# Patient Record
Sex: Female | Born: 1935 | Race: White | Hispanic: No | Smoking: Current every day smoker
Health system: Southern US, Community
[De-identification: ages and names within clinical notes are randomized; demographics above are authoritative.]

## PROBLEM LIST (undated history)

## (undated) DIAGNOSIS — C801 Malignant (primary) neoplasm, unspecified: Secondary | ICD-10-CM

## (undated) DIAGNOSIS — F32A Depression, unspecified: Secondary | ICD-10-CM

## (undated) DIAGNOSIS — E0590 Thyrotoxicosis, unspecified without thyrotoxic crisis or storm: Secondary | ICD-10-CM

## (undated) DIAGNOSIS — M797 Fibromyalgia: Secondary | ICD-10-CM

## (undated) DIAGNOSIS — T7840XA Allergy, unspecified, initial encounter: Secondary | ICD-10-CM

## (undated) DIAGNOSIS — B029 Zoster without complications: Secondary | ICD-10-CM

## (undated) DIAGNOSIS — K219 Gastro-esophageal reflux disease without esophagitis: Secondary | ICD-10-CM

## (undated) DIAGNOSIS — K559 Vascular disorder of intestine, unspecified: Secondary | ICD-10-CM

## (undated) DIAGNOSIS — I739 Peripheral vascular disease, unspecified: Secondary | ICD-10-CM

## (undated) DIAGNOSIS — F329 Major depressive disorder, single episode, unspecified: Secondary | ICD-10-CM

## (undated) HISTORY — DX: Major depressive disorder, single episode, unspecified: F32.9

## (undated) HISTORY — DX: Allergy, unspecified, initial encounter: T78.40XA

## (undated) HISTORY — DX: Depression, unspecified: F32.A

## (undated) HISTORY — DX: Malignant (primary) neoplasm, unspecified: C80.1

## (undated) HISTORY — DX: Thyrotoxicosis, unspecified without thyrotoxic crisis or storm: E05.90

## (undated) HISTORY — PX: TONSILLECTOMY: SUR1361

## (undated) HISTORY — DX: Vascular disorder of intestine, unspecified: K55.9

## (undated) HISTORY — DX: Fibromyalgia: M79.7

## (undated) HISTORY — DX: Peripheral vascular disease, unspecified: I73.9

## (undated) HISTORY — PX: CERVICAL FUSION: SHX112

## (undated) HISTORY — DX: Gastro-esophageal reflux disease without esophagitis: K21.9

## (undated) HISTORY — DX: Zoster without complications: B02.9

---
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
## 1967-10-10 HISTORY — PX: HEMORRHOID SURGERY: SHX153

## 1973-10-09 HISTORY — PX: ABDOMINAL HYSTERECTOMY: SHX81

## 1977-10-09 HISTORY — PX: ORIF ANKLE FRACTURE: SUR919

## 1988-10-09 HISTORY — PX: CARPAL TUNNEL RELEASE: SHX101

## 2007-05-13 DIAGNOSIS — B029 Zoster without complications: Secondary | ICD-10-CM | POA: Insufficient documentation

## 2007-05-24 DIAGNOSIS — H409 Unspecified glaucoma: Secondary | ICD-10-CM | POA: Insufficient documentation

## 2007-05-24 DIAGNOSIS — L719 Rosacea, unspecified: Secondary | ICD-10-CM | POA: Insufficient documentation

## 2007-05-24 DIAGNOSIS — C4490 Unspecified malignant neoplasm of skin, unspecified: Secondary | ICD-10-CM | POA: Insufficient documentation

## 2007-10-10 HISTORY — PX: CATARACT EXTRACTION: SUR2

## 2007-11-04 DIAGNOSIS — F341 Dysthymic disorder: Secondary | ICD-10-CM | POA: Insufficient documentation

## 2007-12-03 DIAGNOSIS — F411 Generalized anxiety disorder: Secondary | ICD-10-CM | POA: Insufficient documentation

## 2008-10-09 DIAGNOSIS — E0590 Thyrotoxicosis, unspecified without thyrotoxic crisis or storm: Secondary | ICD-10-CM

## 2008-10-09 HISTORY — DX: Thyrotoxicosis, unspecified without thyrotoxic crisis or storm: E05.90

## 2009-07-14 DIAGNOSIS — K279 Peptic ulcer, site unspecified, unspecified as acute or chronic, without hemorrhage or perforation: Secondary | ICD-10-CM | POA: Insufficient documentation

## 2010-10-25 DIAGNOSIS — M5412 Radiculopathy, cervical region: Secondary | ICD-10-CM | POA: Insufficient documentation

## 2010-10-25 DIAGNOSIS — IMO0001 Reserved for inherently not codable concepts without codable children: Secondary | ICD-10-CM | POA: Insufficient documentation

## 2011-03-03 ENCOUNTER — Encounter: Payer: Self-pay | Admitting: Family Medicine

## 2011-03-03 ENCOUNTER — Ambulatory Visit (INDEPENDENT_AMBULATORY_CARE_PROVIDER_SITE_OTHER): Payer: Medicare HMO | Admitting: Family Medicine

## 2011-03-03 VITALS — BP 127/80 | HR 109 | Temp 98.4°F | Ht 62.0 in | Wt 126.0 lb

## 2011-03-03 DIAGNOSIS — E0590 Thyrotoxicosis, unspecified without thyrotoxic crisis or storm: Secondary | ICD-10-CM

## 2011-03-03 DIAGNOSIS — M5030 Other cervical disc degeneration, unspecified cervical region: Secondary | ICD-10-CM

## 2011-03-03 DIAGNOSIS — IMO0001 Reserved for inherently not codable concepts without codable children: Secondary | ICD-10-CM

## 2011-03-03 DIAGNOSIS — M797 Fibromyalgia: Secondary | ICD-10-CM

## 2011-03-03 DIAGNOSIS — F329 Major depressive disorder, single episode, unspecified: Secondary | ICD-10-CM

## 2011-03-03 DIAGNOSIS — K219 Gastro-esophageal reflux disease without esophagitis: Secondary | ICD-10-CM

## 2011-03-03 DIAGNOSIS — F3289 Other specified depressive episodes: Secondary | ICD-10-CM

## 2011-03-03 DIAGNOSIS — F319 Bipolar disorder, unspecified: Secondary | ICD-10-CM | POA: Insufficient documentation

## 2011-03-03 MED ORDER — HYDROCODONE-ACETAMINOPHEN 10-325 MG PO TABS: 1.0000 | ORAL_TABLET | Freq: Four times a day (QID) | ORAL | Status: DC | PRN

## 2011-03-03 MED ORDER — ALPRAZOLAM 0.5 MG PO TABS: 0.5000 mg | ORAL_TABLET | Freq: Four times a day (QID) | ORAL | Status: DC | PRN

## 2011-03-03 MED ORDER — SERTRALINE HCL 25 MG PO TABS: ORAL_TABLET | ORAL | Status: DC

## 2011-03-03 NOTE — Patient Instructions (Signed)
Stay on current meds.  I would strongly encourage you to start Sertraline 25 mg once daily for your mood.  After the first wk, you can increase this to 2 tabs once daily. Call me if any problems.  Return for f/u mood and fasting labs in 6 wks.
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
## 2011-03-27 ENCOUNTER — Encounter: Payer: Self-pay | Admitting: Family Medicine

## 2011-03-27 DIAGNOSIS — E039 Hypothyroidism, unspecified: Secondary | ICD-10-CM

## 2011-03-27 DIAGNOSIS — E0590 Thyrotoxicosis, unspecified without thyrotoxic crisis or storm: Secondary | ICD-10-CM | POA: Insufficient documentation

## 2011-03-27 DIAGNOSIS — M5030 Other cervical disc degeneration, unspecified cervical region: Secondary | ICD-10-CM

## 2011-04-14 ENCOUNTER — Ambulatory Visit (INDEPENDENT_AMBULATORY_CARE_PROVIDER_SITE_OTHER): Payer: Medicare HMO | Admitting: Family Medicine

## 2011-04-14 ENCOUNTER — Encounter: Payer: Self-pay | Admitting: Family Medicine

## 2011-04-14 DIAGNOSIS — Z13220 Encounter for screening for lipoid disorders: Secondary | ICD-10-CM

## 2011-04-14 DIAGNOSIS — Z130 Encounter for screening for diseases of the blood and blood-forming organs and certain disorders involving the immune mechanism: Secondary | ICD-10-CM

## 2011-04-14 DIAGNOSIS — E039 Hypothyroidism, unspecified: Secondary | ICD-10-CM

## 2011-04-14 DIAGNOSIS — F319 Bipolar disorder, unspecified: Secondary | ICD-10-CM

## 2011-04-14 DIAGNOSIS — R5383 Other fatigue: Secondary | ICD-10-CM

## 2011-04-14 DIAGNOSIS — M5030 Other cervical disc degeneration, unspecified cervical region: Secondary | ICD-10-CM

## 2011-04-14 DIAGNOSIS — Z1231 Encounter for screening mammogram for malignant neoplasm of breast: Secondary | ICD-10-CM

## 2011-04-14 LAB — LIPID PANEL
Cholesterol: 309 mg/dL — ABNORMAL HIGH (ref 0–200)
HDL: 48 mg/dL (ref >39)
LDL Cholesterol: 215 mg/dL — ABNORMAL HIGH (ref 0–99)
Total CHOL/HDL Ratio: 6.4 Ratio
Triglycerides: 230 mg/dL — ABNORMAL HIGH (ref <150)
VLDL: 46 mg/dL — ABNORMAL HIGH (ref 0–40)

## 2011-04-14 LAB — CBC WITH DIFFERENTIAL/PLATELET
Basophils Absolute: 0 10*3/uL (ref 0.0–0.1)
Basophils Relative: 1 % (ref 0–1)
Eosinophils Absolute: 0.1 10*3/uL (ref 0.0–0.7)
Eosinophils Relative: 2 % (ref 0–5)
HCT: 45.2 % (ref 36.0–46.0)
Hemoglobin: 15.1 g/dL — ABNORMAL HIGH (ref 12.0–15.0)
Lymphocytes Relative: 32 % (ref 12–46)
Lymphs Abs: 2.1 10*3/uL (ref 0.7–4.0)
MCH: 30.8 pg (ref 26.0–34.0)
MCHC: 33.4 g/dL (ref 30.0–36.0)
MCV: 92.2 fL (ref 78.0–100.0)
Monocytes Absolute: 0.5 10*3/uL (ref 0.1–1.0)
Monocytes Relative: 8 % (ref 3–12)
Neutro Abs: 3.8 10*3/uL (ref 1.7–7.7)
Neutrophils Relative %: 58 % (ref 43–77)
Platelets: 220 10*3/uL (ref 150–400)
RBC: 4.9 MIL/uL (ref 3.87–5.11)
RDW: 14.1 % (ref 11.5–15.5)
WBC: 6.5 10*3/uL (ref 4.0–10.5)

## 2011-04-14 MED ORDER — HYDROCODONE-ACETAMINOPHEN 10-325 MG PO TABS: 1.0000 | ORAL_TABLET | Freq: Four times a day (QID) | ORAL | Status: DC | PRN

## 2011-04-14 NOTE — Patient Instructions (Signed)
Labs downstairs today. Will call you w/ results on Monday.  Vicodin RFd.  F/u with ortho.  Call if any worsening in mood, needing referral to psych,etc.  Call [phone] and ask for [facility] to schedule your mammogram.

## 2011-04-14 NOTE — Assessment & Plan Note (Signed)
I did RF her vicodin today.  She has a 3 wk f/u with Dr Carole Binning at [facility].
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
## 2011-04-14 NOTE — Assessment & Plan Note (Addendum)
PHQ 9 score is 8.  Off Zoloft due to SEs and has failed multiple other SNRIs and SSRIs.  She is not overusing her Xanax and is seeing a Veterinary surgeon.  She declined a psychiatry referral and will call if any worsening mood.  I cannot make her see psych since she is not a direct threat to herself.

## 2011-04-14 NOTE — Assessment & Plan Note (Signed)
Sees endocrinology for her thyroid.

## 2011-04-15 LAB — COMPLETE METABOLIC PANEL WITH GFR
ALT: 12 U/L (ref 0–35)
AST: 18 U/L (ref 0–37)
Albumin: 4.7 g/dL (ref 3.5–5.2)
Alkaline Phosphatase: 85 U/L (ref 39–117)
BUN: 11 mg/dL (ref 6–23)
CO2: 25 mEq/L (ref 19–32)
Calcium: 9.6 mg/dL (ref 8.4–10.5)
Chloride: 105 mEq/L (ref 96–112)
Creat: 0.83 mg/dL (ref 0.50–1.10)
GFR, Est African American: >60 mL/min (ref >60)
GFR, Est Non African American: >60 mL/min (ref >60)
Glucose, Bld: 97 mg/dL (ref 70–99)
Potassium: 4.6 mEq/L (ref 3.5–5.3)
Sodium: 138 mEq/L (ref 135–145)
Total Bilirubin: 0.7 mg/dL (ref 0.3–1.2)
Total Protein: 7.4 g/dL (ref 6.0–8.3)

## 2011-04-16 ENCOUNTER — Telehealth: Payer: Self-pay | Admitting: Family Medicine

## 2011-04-16 DIAGNOSIS — E7800 Pure hypercholesterolemia, unspecified: Secondary | ICD-10-CM | POA: Insufficient documentation

## 2011-04-16 NOTE — Telephone Encounter (Signed)
Pls let pt know that her blood counts, fasting sugar, liver and kidney function look great.  Her cholesterol is VERY high.  Has she ever been on RX meds for this?  I suggest treating her to reduce her risk for heart attack and stroke.

## 2011-04-17 MED ORDER — ROSUVASTATIN CALCIUM 20 MG PO TABS: 10.0000 mg | ORAL_TABLET | Freq: Every day | ORAL | Status: DC

## 2011-04-17 NOTE — Telephone Encounter (Signed)
LMOM informing Pt of the above 

## 2011-04-17 NOTE — Telephone Encounter (Signed)
Pt aware of the above. Pt agrees to medication. Please send to pharm

## 2011-04-17 NOTE — Telephone Encounter (Signed)
I will start her on samples of Crestor, 1/2 of a 20 mg tab at bedtime everyday. Repeat labs in 2 mos.  Call if any problems.

## 2011-04-18 ENCOUNTER — Telehealth: Payer: Self-pay | Admitting: Family Medicine

## 2011-04-18 NOTE — Telephone Encounter (Signed)
Returning call from yesterday.  Please call the patient at [phone]. Plan:  Routed to Payton Spark,  CMA since this is a [facility] patient. Jarvis Newcomer, LPN [facility]

## 2011-04-18 NOTE — Telephone Encounter (Signed)
Pt aware of the above  

## 2011-04-25 ENCOUNTER — Ambulatory Visit
Admission: RE | Admit: 2011-04-25 | Discharge: 2011-04-25 | Disposition: A | Discharge status: Home / Self Care | Payer: Medicare HMO | Source: Ambulatory Visit | Attending: Family Medicine | Admitting: Family Medicine

## 2011-04-25 DIAGNOSIS — Z1231 Encounter for screening mammogram for malignant neoplasm of breast: Secondary | ICD-10-CM

## 2011-04-26 ENCOUNTER — Other Ambulatory Visit: Payer: Self-pay | Admitting: Family Medicine

## 2011-04-26 NOTE — Telephone Encounter (Signed)
Request was sent for hydrocodone apap.  Script was sent on 04-14-11 for #90/0 refills,  Denied the script. Jarvis Newcomer, LPN [facility]

## 2011-05-12 ENCOUNTER — Other Ambulatory Visit: Payer: Self-pay | Admitting: *Deleted

## 2011-05-12 ENCOUNTER — Telehealth: Payer: Self-pay | Admitting: Family Medicine

## 2011-05-12 MED ORDER — ALPRAZOLAM 0.5 MG PO TABS: 0.5000 mg | ORAL_TABLET | Freq: Four times a day (QID) | ORAL | Status: DC | PRN

## 2011-05-12 NOTE — Telephone Encounter (Signed)
This has been done.

## 2011-05-12 NOTE — Telephone Encounter (Signed)
Patient called states her pharmacy said they have not received prescription refill request for zanex and she needs her medicine refill today before the weekend. Pt request a call back [phone]

## 2011-05-26 LAB — VITAMIN D 25 HYDROXY (VIT D DEFICIENCY, FRACTURES)
Calcium: 9.9 mg/dL
Chloride, Serum: 104
Vit D, 25-Hydroxy: 34.9

## 2011-05-26 LAB — CBC AND DIFFERENTIAL
HCT: 46 % (ref 36–46)
Hemoglobin: 15.4 g/dL (ref 12.0–16.0)

## 2011-05-26 LAB — HEPATIC FUNCTION PANEL
ALT: 11 U/L (ref 7–35)
AST: 16 U/L (ref 13–35)

## 2011-05-26 LAB — BASIC METABOLIC PANEL
BUN: 8 mg/dL (ref 4–21)
Creatinine: 0.8 mg/dL (ref 0.5–1.1)
Glucose: 121 mg/dL
Potassium: 4.2 mmol/L (ref 3.4–5.3)

## 2011-06-16 ENCOUNTER — Encounter: Payer: Self-pay | Admitting: Family Medicine

## 2011-06-16 ENCOUNTER — Ambulatory Visit (INDEPENDENT_AMBULATORY_CARE_PROVIDER_SITE_OTHER): Payer: Medicare HMO | Admitting: Family Medicine

## 2011-06-16 DIAGNOSIS — I10 Essential (primary) hypertension: Secondary | ICD-10-CM

## 2011-06-16 DIAGNOSIS — K219 Gastro-esophageal reflux disease without esophagitis: Secondary | ICD-10-CM | POA: Insufficient documentation

## 2011-06-16 DIAGNOSIS — E7800 Pure hypercholesterolemia, unspecified: Secondary | ICD-10-CM

## 2011-06-16 DIAGNOSIS — E785 Hyperlipidemia, unspecified: Secondary | ICD-10-CM

## 2011-06-16 MED ORDER — NITROFURANTOIN MONOHYD MACRO 100 MG PO CAPS: 100.0000 mg | ORAL_CAPSULE | ORAL | Status: DC | PRN

## 2011-06-16 NOTE — Progress Notes (Signed)
  Subjective:    Patient ID: Heather Perkins, female    DOB: 03/05/1934, 77 y.o.   MRN: [id number]  HPI Gets nauseated in the AM. She said that the over-the-counter Zegerid that she uses does seem to help. She also has used something that is an audible supplement that she says also works very well. She says she just recently ran out of it but plans to restart it.  Here to f/u  lipids. She was restarted on Crestor and is here to recheck her levels. She said she did well in the past but so far has not had any problems or side effects or myalgias. She says she is compliant with her medications. She denies any chest pain or dizziness.   Review of Systems     Objective:   Physical Exam  Constitutional: She is oriented to person, place, and time. She appears well-developed and well-nourished.  HENT:  Head: Normocephalic and atraumatic.  Neck: Neck supple.  Cardiovascular: Normal rate, regular rhythm and normal heart sounds.        No carotid bruits.   Pulmonary/Chest: Effort normal and breath sounds normal.  Neurological: She is alert and oriented to person, place, and time.  Skin: Skin is warm and dry.  Psychiatric: She has a normal mood and affect. Her behavior is normal.          Assessment & Plan:  Blood pressure looks well controlled today.  GERD-if the herbal supplements and to help him pursue she can certainly restart this if not The over-the-counter Prilosec or Prevacid or Zegerid products is fine. I did instruct her to reduce her caffeine intake as well.

## 2011-06-16 NOTE — Assessment & Plan Note (Signed)
She is to recheck her lipids today on the Crestor. I did give her some more samples as it is an expensive medication for her.

## 2011-06-17 LAB — COMPLETE METABOLIC PANEL WITH GFR
ALT: 10 U/L (ref 0–35)
AST: 19 U/L (ref 0–37)
Albumin: 4.6 g/dL (ref 3.5–5.2)
Alkaline Phosphatase: 74 U/L (ref 39–117)
BUN: 10 mg/dL (ref 6–23)
CO2: 24 mEq/L (ref 19–32)
Calcium: 9.8 mg/dL (ref 8.4–10.5)
Chloride: 105 mEq/L (ref 96–112)
Creat: 0.77 mg/dL (ref 0.50–1.10)
GFR, Est African American: >60 mL/min (ref >60)
GFR, Est Non African American: >60 mL/min (ref >60)
Glucose, Bld: 95 mg/dL (ref 70–99)
Potassium: 4.5 mEq/L (ref 3.5–5.3)
Sodium: 139 mEq/L (ref 135–145)
Total Bilirubin: 0.7 mg/dL (ref 0.3–1.2)
Total Protein: 7.4 g/dL (ref 6.0–8.3)

## 2011-06-17 LAB — LIPID PANEL
Cholesterol: 167 mg/dL (ref 0–200)
HDL: 52 mg/dL (ref >39)
LDL Cholesterol: 84 mg/dL (ref 0–99)
Total CHOL/HDL Ratio: 3.2 Ratio
Triglycerides: 154 mg/dL — ABNORMAL HIGH (ref <150)
VLDL: 31 mg/dL (ref 0–40)

## 2011-06-19 ENCOUNTER — Telehealth: Payer: Self-pay | Admitting: Family Medicine

## 2011-06-19 NOTE — Telephone Encounter (Signed)
Pt calling for lab results.   Plan:  Pt informed of her lab results. Jarvis Newcomer, LPN [facility]

## 2011-06-19 NOTE — Telephone Encounter (Signed)
Call patient: Complete metabolic panel is normal. Cholesterol was normal except for triglycerides were just borderline elevated. Just make sure getting regular exercise and eating healthy lower fat diet. You could also consider taking a fish oil tab once a day to help improve triglyceride . Recheck in one year.

## 2011-06-19 NOTE — Telephone Encounter (Signed)
LMOM informing Pt  

## 2011-06-20 ENCOUNTER — Other Ambulatory Visit: Payer: Self-pay | Admitting: *Deleted

## 2011-06-20 MED ORDER — HYDROCODONE-ACETAMINOPHEN 10-325 MG PO TABS: 1.0000 | ORAL_TABLET | Freq: Four times a day (QID) | ORAL | Status: DC | PRN

## 2011-06-20 MED ORDER — ALPRAZOLAM 0.5 MG PO TABS: 0.5000 mg | ORAL_TABLET | Freq: Four times a day (QID) | ORAL | Status: DC | PRN

## 2011-06-23 ENCOUNTER — Telehealth: Payer: Self-pay | Admitting: Family Medicine

## 2011-06-23 NOTE — Telephone Encounter (Signed)
Pt called and left message for the triage nurse stating the pharm does not have her refill for norco 10-325 mg.   Plan:  Reviewed the pt chart and the norco 10/325 mg was sent and xanex 0.5 was also sent on 06-20-2011.  Gave verbal order for both meds to the pharmacist. Jarvis Newcomer, LPN [facility]'

## 2011-07-06 ENCOUNTER — Ambulatory Visit (INDEPENDENT_AMBULATORY_CARE_PROVIDER_SITE_OTHER): Payer: Medicare HMO | Admitting: Family Medicine

## 2011-07-06 ENCOUNTER — Encounter: Payer: Self-pay | Admitting: Family Medicine

## 2011-07-06 VITALS — BP 107/63 | HR 87 | Temp 98.1°F | Wt 126.0 lb

## 2011-07-06 DIAGNOSIS — J329 Chronic sinusitis, unspecified: Secondary | ICD-10-CM

## 2011-07-06 DIAGNOSIS — M549 Dorsalgia, unspecified: Secondary | ICD-10-CM

## 2011-07-06 DIAGNOSIS — N390 Urinary tract infection, site not specified: Secondary | ICD-10-CM

## 2011-07-06 DIAGNOSIS — R300 Dysuria: Secondary | ICD-10-CM

## 2011-07-06 LAB — POCT URINALYSIS DIPSTICK
Bilirubin, UA: NEGATIVE
Blood, UA: NEGATIVE
Glucose, UA: NEGATIVE
Ketones, UA: NEGATIVE
Leukocytes, UA: NEGATIVE
Nitrite, UA: NEGATIVE
Protein, UA: NEGATIVE
Spec Grav, UA: <=1.005
Urobilinogen, UA: 0.2
pH, UA: 6.5

## 2011-07-06 NOTE — Progress Notes (Signed)
  Subjective:    Patient ID: Heather Perkins, female    DOB: 1934-07-02, 77 y.o.   MRN: [id number]  HPI Bladder pain for 5 days. Then started urinating every 30 min and then got irritated externally. Took AZO for a couple of days.  Then started getting left low back pain that started throbbing about 3 days ago. Increased her fluids and started drinking cranberry juice. Feels week.  Has been getting lightheaded and nauseated.  No fever. Has been having HA as well.  Tooks phenergan when got home.  Some mild naasl congestion and cough for about a week.  Hx of back problems but says this feels different.    Review of Systems     Objective:   Physical Exam  Constitutional: She is oriented to person, place, and time. She appears well-developed and well-nourished.  HENT:  Head: Normocephalic and atraumatic.  Right Ear: External ear normal.  Left Ear: External ear normal.  Nose: Nose normal.  Mouth/Throat: Oropharynx is clear and moist.       TMs and canals are clear.   Eyes: Conjunctivae and EOM are normal. Pupils are equal, round, and reactive to light.  Neck: Neck supple. No thyromegaly present.  Cardiovascular: Normal rate, regular rhythm and normal heart sounds.   Pulmonary/Chest: Effort normal and breath sounds normal. She has no wheezes.  Abdominal: Soft. Bowel sounds are normal. She exhibits no distension and no mass. There is no tenderness. There is no rebound and no guarding.  Lymphadenopathy:    She has no cervical adenopathy.  Neurological: She is alert and oriented to person, place, and time.  Skin: Skin is warm and dry.  Psychiatric: She has a normal mood and affect. Her behavior is normal.  Pain in the left but had CVA tenderness on the right.         Assessment & Plan:  Dysuria - Dong well.  I will send urine culture and will call her with the results on Monday.  No fever which is reassuring thought she has been nauseated. She already has a rx for nitrofurantoin at home so will  start the medication and cll her with the culture results. Bck pain may be related to the UTI but may also be from her back.   Early sinusitis - Call is sxs persist into [place] or worsen.  She is still smoking but trying to work on cutting back.

## 2011-07-06 NOTE — Patient Instructions (Signed)
Start your nitrofurantoin. Let me know if you are better on Monday with your sinuses and your bladder.

## 2011-07-09 LAB — URINE CULTURE
Colony Count: NO GROWTH
Organism ID, Bacteria: NO GROWTH

## 2011-07-18 ENCOUNTER — Telehealth: Payer: Self-pay | Admitting: Family Medicine

## 2011-07-18 DIAGNOSIS — M791 Myalgia, unspecified site: Secondary | ICD-10-CM

## 2011-07-18 NOTE — Telephone Encounter (Addendum)
Try taking every other day and see if feels better. Also need to go to lab to check a CK level and make sure her muscle enzymes are nl.

## 2011-07-18 NOTE — Telephone Encounter (Signed)
Pt called and left mess saying she is having adverse reaction to crestor.  C/O of worsening headaches, unable to sleep at night, GI issues(more flatus).  Pt requesting for further advice.  Trouble exercising due to fibromyalgia.  Please advise. Plan:  Routed to Dr. Marlyne Beards, LPN [facility]

## 2011-07-19 NOTE — Telephone Encounter (Signed)
Pt notified.  Had to [facility] with instructions. Jarvis Newcomer, LPN [facility]

## 2011-07-20 LAB — CK: Total CK: 55 U/L (ref 7–177)

## 2011-07-21 ENCOUNTER — Other Ambulatory Visit: Payer: Self-pay | Admitting: *Deleted

## 2011-07-21 ENCOUNTER — Other Ambulatory Visit: Payer: Self-pay | Admitting: Family Medicine

## 2011-07-21 MED ORDER — HYDROCODONE-ACETAMINOPHEN 10-325 MG PO TABS: 1.0000 | ORAL_TABLET | Freq: Four times a day (QID) | ORAL | Status: DC | PRN

## 2011-07-21 MED ORDER — ALPRAZOLAM 0.5 MG PO TABS: 0.5000 mg | ORAL_TABLET | Freq: Four times a day (QID) | ORAL | Status: DC | PRN

## 2011-07-21 NOTE — Telephone Encounter (Signed)
Pts daughter called for refill of pts medications:  Pain med and anxiety meds. Plan:  Reviewed chart and refills have already been taken care of.  Daughter informed. Jarvis Newcomer, LPN [facility]

## 2011-08-14 ENCOUNTER — Telehealth: Payer: Self-pay | Admitting: *Deleted

## 2011-08-14 NOTE — Telephone Encounter (Signed)
Pt is on cholesterol med livalo- does not fel right on this- having light headedness. Pt is going to stop this med- because she feels terrible. Would like to speak with you personally. Please call at your convience.

## 2011-08-14 NOTE — Telephone Encounter (Signed)
OK to stop med. Let me know once feeling better if she would like to try a different statin or not?

## 2011-08-15 NOTE — Telephone Encounter (Signed)
Pt notified of MD instructions

## 2011-08-21 ENCOUNTER — Other Ambulatory Visit: Payer: Self-pay | Admitting: *Deleted

## 2011-08-21 MED ORDER — HYDROCODONE-ACETAMINOPHEN 10-325 MG PO TABS: 1.0000 | ORAL_TABLET | Freq: Four times a day (QID) | ORAL | Status: DC | PRN

## 2011-08-23 ENCOUNTER — Ambulatory Visit (INDEPENDENT_AMBULATORY_CARE_PROVIDER_SITE_OTHER): Payer: Medicare HMO | Admitting: Family Medicine

## 2011-08-23 ENCOUNTER — Encounter: Payer: Self-pay | Admitting: Family Medicine

## 2011-08-23 DIAGNOSIS — M5030 Other cervical disc degeneration, unspecified cervical region: Secondary | ICD-10-CM

## 2011-08-23 DIAGNOSIS — E7800 Pure hypercholesterolemia, unspecified: Secondary | ICD-10-CM

## 2011-08-23 DIAGNOSIS — E039 Hypothyroidism, unspecified: Secondary | ICD-10-CM

## 2011-08-23 MED ORDER — NITROFURANTOIN MONOHYD MACRO 100 MG PO CAPS: 100.0000 mg | ORAL_CAPSULE | ORAL | Status: DC | PRN

## 2011-08-23 NOTE — Progress Notes (Signed)
  Subjective:    Patient ID: Heather Perkins, female    DOB: 10-07-1934, 77 y.o.   MRN: [id number]  HPI Felt horrible when took the livalo. Got constipated.  She says it really flared her fibromyalgia.  Says also got a sinus infection.  Still getting  Some tickling in her throat. Then stopped hte medication and started to feel better after about 4 days. Started taking honey.  + post nasal drip.  No fever.  She plans on taking cholesterol Glycerite which is a natural herb. She does not want to take any more statins. She says she is very active but does not necessarily have an exercise program.  She also is looking at it she did have problems getting her narcotic pain medication refill. She said she had a copy office multiple times. She does want to let me know. Evidently we did get it straightened out and a prescription was sent on Monday.  Review of Systems     Objective:   Physical Exam  Constitutional: She is oriented to person, place, and time. She appears well-developed and well-nourished.  HENT:  Head: Normocephalic and atraumatic.  Right Ear: External ear normal.  Left Ear: External ear normal.  Nose: Nose normal.  Mouth/Throat: Oropharynx is clear and moist.       TMs and canals are clear.   Eyes: Conjunctivae and EOM are normal. Pupils are equal, round, and reactive to light.  Neck: Neck supple. No thyromegaly present.  Cardiovascular: Normal rate, regular rhythm and normal heart sounds.   Pulmonary/Chest: Effort normal and breath sounds normal. She has no wheezes.  Lymphadenopathy:    She has no cervical adenopathy.  Neurological: She is alert and oriented to person, place, and time.  Skin: Skin is warm and dry.  Psychiatric: She has a normal mood and affect. Her behavior is normal.   She has skin tag on the right side of her neck. She also had a couple of larger seborrheic keratoses that are pigmented on her neck. And some smaller pigmented seborrheic keratoses on her forehead and  temples.       Assessment & Plan:  Hyperlipidemia - Discussed That she really doesn't want to take a statin.  She wants to try a natural tx instead of rx med. Recheck in Jan.   Neck pain - she will folllow up with ortho at th beginning of the year. We are currently prescribe her pain medications. No adjustments to her regimen.  Hypothyroid-she said that she might try taking herbal supplements instead of her thyroid hormone. I actually encouraged her not to do this but if she does then I always want her to come in and have her hormone levels checked to make sure they're well controlled.  She also had several skin tags and seborrheic keratoses on her face and neck that she would be looking at today. I explained to her what these are and what we can do for treatment including cryotherapy to the seborrheic keratosis and cutting the tags off. She will need to schedule followup appointment for derm procedure.

## 2011-09-19 ENCOUNTER — Other Ambulatory Visit: Payer: Self-pay | Admitting: *Deleted

## 2011-09-19 MED ORDER — ALPRAZOLAM 0.5 MG PO TABS: 0.5000 mg | ORAL_TABLET | Freq: Four times a day (QID) | ORAL | Status: DC | PRN

## 2011-09-19 MED ORDER — HYDROCODONE-ACETAMINOPHEN 10-325 MG PO TABS: 1.0000 | ORAL_TABLET | Freq: Four times a day (QID) | ORAL | Status: DC | PRN

## 2011-10-24 ENCOUNTER — Other Ambulatory Visit: Payer: Self-pay | Admitting: *Deleted

## 2011-10-24 MED ORDER — HYDROCODONE-ACETAMINOPHEN 10-325 MG PO TABS: 1.0000 | ORAL_TABLET | Freq: Four times a day (QID) | ORAL | Status: DC | PRN

## 2011-10-25 ENCOUNTER — Other Ambulatory Visit: Payer: Self-pay | Admitting: *Deleted

## 2011-10-25 MED ORDER — ALPRAZOLAM 0.5 MG PO TABS: 0.5000 mg | ORAL_TABLET | Freq: Four times a day (QID) | ORAL | Status: DC | PRN

## 2011-11-23 ENCOUNTER — Ambulatory Visit: Payer: Medicare HMO | Admitting: Family Medicine

## 2011-11-27 ENCOUNTER — Other Ambulatory Visit: Payer: Self-pay | Admitting: *Deleted

## 2011-11-27 MED ORDER — HYDROCODONE-ACETAMINOPHEN 10-325 MG PO TABS: 1.0000 | ORAL_TABLET | Freq: Four times a day (QID) | ORAL | Status: DC | PRN

## 2011-11-30 ENCOUNTER — Other Ambulatory Visit: Payer: Self-pay | Admitting: *Deleted

## 2011-11-30 MED ORDER — HYDROCODONE-ACETAMINOPHEN 10-325 MG PO TABS: 1.0000 | ORAL_TABLET | Freq: Four times a day (QID) | ORAL | Status: DC | PRN

## 2011-12-14 ENCOUNTER — Ambulatory Visit (INDEPENDENT_AMBULATORY_CARE_PROVIDER_SITE_OTHER): Payer: Medicare HMO | Admitting: Family Medicine

## 2011-12-14 ENCOUNTER — Telehealth: Payer: Self-pay | Admitting: Family Medicine

## 2011-12-14 ENCOUNTER — Encounter: Payer: Self-pay | Admitting: Family Medicine

## 2011-12-14 DIAGNOSIS — D492 Neoplasm of unspecified behavior of bone, soft tissue, and skin: Secondary | ICD-10-CM

## 2011-12-14 DIAGNOSIS — L919 Hypertrophic disorder of the skin, unspecified: Secondary | ICD-10-CM

## 2011-12-14 DIAGNOSIS — L909 Atrophic disorder of skin, unspecified: Secondary | ICD-10-CM

## 2011-12-14 DIAGNOSIS — L820 Inflamed seborrheic keratosis: Secondary | ICD-10-CM

## 2011-12-14 DIAGNOSIS — Q828 Other specified congenital malformations of skin: Secondary | ICD-10-CM

## 2011-12-14 MED ORDER — CYCLOBENZAPRINE HCL 10 MG PO TABS: 10.0000 mg | ORAL_TABLET | Freq: Three times a day (TID) | ORAL | Status: DC | PRN

## 2011-12-14 NOTE — Telephone Encounter (Signed)
Call pt and let her know we will fax the flexeril over today.

## 2011-12-14 NOTE — Progress Notes (Signed)
Subjective:    Patient ID: Heather Perkins, female    DOB: [birth year], 78 y.o.   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   Review of Systems     Objective:   Physical Exam        Assessment & Plan:  Shave Biopsy Procedure Note  Pre-operative Diagnosis: Suspicious lesion  Post-operative Diagnosis: same  Locations:left superio edge of ear.   Indications: has been frozen several times without resolution  Anesthesia: Lidocaine 2% without epinephrine without added sodium bicarbonate  Procedure Details  History of allergy to iodine: no  Patient informed of the risks (including bleeding and infection) and benefits of the  procedure and Verbal informed consent obtained.  The lesion and surrounding area were given a sterile prep using betadyne and draped in the usual sterile fashion. A scalpel was used to shave an area of skin approximately 0.8cm by 4cm.  Hemostasis achieved with nitroswab. Antibiotic ointment and a sterile dressing applied.  The specimen was sent for pathologic examination. The patient tolerated the procedure well.  EBL: 1 ml  Findings: Unknown   Condition: Stable  Complications: none.  Plan: 1. Instructed to keep the wound dry and covered for 24-48h and clean thereafter. 2. Warning signs of infection were reviewed.   3. Recommended that the patient use OTC acetaminophen as needed for pain.  4. Return prn . Subjective:     Heather Perkins is a 78 y.o. female who was referred to me for evaluation and treatment of seborrheic keratosis.  The lesion has changed in color recently, and has changed in size. The lesion is irritating. Patient denies bleeding or ulceration.  Patient reports other skin lesions. Patient wishes the lesion treated via destruction therapy  Review of Systems     Objective:    Skin: 0.8  cm brown stuck-on waxy hyperkeratotic papule/plaque  without secondary changes on the right side of neck near her ear and one on the left side of mid-neck . Examination of face/arms/back/chest/abd. Shows no suspicious lesions.    Assessment:    Seborrheic Keratosis    Plan:    1. Treatment options also discussed including: cryotherapy and shave.   Discussed risks and  verbally consents to treatment.  2. Procedure Note: The area was cleaned with alcohol and anesthetized with approximately half a cc of 1% lidocaine with epi. The 2 lesions were removed by shaving it off. Patient tolerated procedure well.  Chloride was used for hemostasis. No blood loss.  3. Verbal patient instruction given.  4. Follow up as needed for acute illness. Skin Tag Removal Procedure Note  Pre-operative Diagnosis: Classic skin tags (acrochordon)  Post-operative Diagnosis: Classic skin tags (acrochordon)  Locations:anterior and right side of neck   Indications: irritated.   Anesthesia: 1% lidocaine with epi  Procedure Details  The risks (including bleeding and infection) and benefits of the procedure and Verbal informed consent obtained. Using sterile iris scissors, multiple skin tags were snipped off at their bases after cleansing with Betadine.  Bleeding was controlled by pressure.   Findings: Pathognomonic benign lesions  not sent for pathological exam.  Condition: Stable  Complications: none.  Plan: 1. Instructed to keep the wounds dry and covered for 24-48h and clean thereafter. 2. Warning signs of infection were reviewed.   3. Recommended that the patient use OTC acetaminophen as needed for pain.  4. Return as needed.

## 2011-12-19 ENCOUNTER — Encounter: Payer: Self-pay | Admitting: Physician Assistant

## 2011-12-19 ENCOUNTER — Ambulatory Visit (INDEPENDENT_AMBULATORY_CARE_PROVIDER_SITE_OTHER): Payer: Medicare HMO | Admitting: Physician Assistant

## 2011-12-19 ENCOUNTER — Telehealth: Payer: Self-pay | Admitting: Family Medicine

## 2011-12-19 VITALS — BP 110/68 | HR 92 | Temp 98.3°F | Wt 117.0 lb

## 2011-12-19 DIAGNOSIS — J329 Chronic sinusitis, unspecified: Secondary | ICD-10-CM

## 2011-12-19 DIAGNOSIS — Z1211 Encounter for screening for malignant neoplasm of colon: Secondary | ICD-10-CM

## 2011-12-19 MED ORDER — AMOXICILLIN-POT CLAVULANATE 875-125 MG PO TABS: 1.0000 | ORAL_TABLET | Freq: Two times a day (BID) | ORAL | Status: AC

## 2011-12-19 NOTE — Patient Instructions (Addendum)
Augmentin twice a day for 10 days. Continue doing all other symptomatic treatment for congestion. Call office if not improving in the next 3-4 days.   Sinusitis Sinuses are air pockets within the bones of your face. The growth of bacteria within a sinus leads to infection. The infection prevents the sinuses from draining. This infection is called sinusitis. SYMPTOMS  There will be different areas of pain depending on which sinuses have become infected.  The maxillary sinuses often produce pain beneath the eyes.   Frontal sinusitis may cause pain in the middle of the forehead and above the eyes.  Other problems (symptoms) include:  Toothaches.   Colored, pus-like (purulent) drainage from the nose.   Swelling, warmth, and tenderness over the sinus areas may be signs of infection.  TREATMENT  Sinusitis is most often determined by an exam.X-rays may be taken. If x-rays have been taken, make sure you obtain your results or find out how you are to obtain them. Your caregiver may give you medications (antibiotics). These are medications that will help kill the bacteria causing the infection. You may also be given a medication (decongestant) that helps to reduce sinus swelling.  HOME CARE INSTRUCTIONS   Only take over-the-counter or prescription medicines for pain, discomfort, or fever as directed by your caregiver.   Drink extra fluids. Fluids help thin the mucus so your sinuses can drain more easily.   Applying either moist heat or ice packs to the sinus areas may help relieve discomfort.   Use saline nasal sprays to help moisten your sinuses. The sprays can be found at your local drugstore.  SEEK IMMEDIATE MEDICAL CARE IF:  You have a fever.   You have increasing pain, severe headaches, or toothache.   You have nausea, vomiting, or drowsiness.   You develop unusual swelling around the face or trouble seeing.  MAKE SURE YOU:   Understand these instructions.   Will watch your  condition.   Will get help right away if you are not doing well or get worse.  Document Released: 09/25/2005 Document Revised: 09/14/2011 Document Reviewed: 04/24/2007 [facility] Patient Information 2012 [place], [place].

## 2011-12-19 NOTE — Telephone Encounter (Signed)
Path report shows lesion was an irritated seborrheic keratosis.  This is a benign lesion

## 2011-12-19 NOTE — Progress Notes (Signed)
  Subjective:    Patient ID: Heather Perkins, female    DOB: Aug 02, 1934, 78 y.o.   MRN: [id number]  Sinusitis This is a new problem. The current episode started more than 1 month ago. The problem has been gradually worsening since onset. There has been no fever. Her pain is at a severity of 6/10. The pain is moderate. Associated symptoms include congestion, coughing, headaches, sinus pressure and sneezing. Pertinent negatives include no chills, ear pain, shortness of breath, sore throat or swollen glands. (Mild cough without production. Wheezing at night.) Past treatments include acetaminophen (Nyquil and Tylenol.). The treatment provided mild relief.   Patient needs colonoscopy.    Review of Systems  Constitutional: Negative for chills.  HENT: Positive for congestion, sneezing and sinus pressure. Negative for ear pain and sore throat.   Respiratory: Positive for cough. Negative for shortness of breath.   Neurological: Positive for headaches.       Objective:   Physical Exam  Constitutional: She is oriented to person, place, and time.  HENT:  Head: Normocephalic and atraumatic.  Right Ear: External ear normal.  Left Ear: External ear normal.  Mouth/Throat: Oropharynx is clear and moist. No oropharyngeal exudate.       Patient had hearing aids and they were removed. TM's were dull with no erythema, pus, or blood.  Maxillary tenderness bilaterally with palpation. Turbinates with erythema and edema along with rhinorrhea.   Eyes:       Conjunctiva injected with clear watery discharge bilaterally.  Neck: Normal range of motion. Neck supple.  Cardiovascular: Normal rate, regular rhythm and normal heart sounds.   Pulmonary/Chest: Effort normal and breath sounds normal. She has no wheezes.  Lymphadenopathy:    She has no cervical adenopathy.  Neurological: She is alert and oriented to person, place, and time.  Skin: Skin is warm and dry.  Psychiatric: She has a normal mood and affect. Her  behavior is normal.          Assessment & Plan:  Sinusitis- Augmentin twice a day for 10 days. Continue doing all other symptomatic treatment for congestion. Call office if not improving in the next 3-4 days. Gave handout on sinusitis.   Will refer for colonoscopy. Discuss Zostavax shot. She wants to continue to think about it. She did already have the shingles. She is going to see when last Tdap was.

## 2011-12-19 NOTE — Telephone Encounter (Signed)
Pt informed

## 2011-12-20 NOTE — Telephone Encounter (Signed)
This has already been done.

## 2011-12-27 ENCOUNTER — Other Ambulatory Visit: Payer: Self-pay | Admitting: *Deleted

## 2011-12-27 MED ORDER — ALPRAZOLAM 0.5 MG PO TABS: 0.5000 mg | ORAL_TABLET | Freq: Four times a day (QID) | ORAL | Status: DC | PRN

## 2011-12-27 MED ORDER — HYDROCODONE-ACETAMINOPHEN 10-325 MG PO TABS: 1.0000 | ORAL_TABLET | Freq: Four times a day (QID) | ORAL | Status: DC | PRN

## 2012-01-17 ENCOUNTER — Encounter: Payer: Self-pay | Admitting: Family Medicine

## 2012-01-30 ENCOUNTER — Other Ambulatory Visit: Payer: Self-pay | Admitting: *Deleted

## 2012-01-30 MED ORDER — HYDROCODONE-ACETAMINOPHEN 10-325 MG PO TABS: 1.0000 | ORAL_TABLET | Freq: Four times a day (QID) | ORAL | Status: DC | PRN

## 2012-01-30 MED ORDER — ALPRAZOLAM 0.5 MG PO TABS: 0.5000 mg | ORAL_TABLET | Freq: Four times a day (QID) | ORAL | Status: DC | PRN

## 2012-02-13 ENCOUNTER — Encounter: Payer: Self-pay | Admitting: *Deleted

## 2012-02-27 LAB — TSH: TSH: 2.48 u[IU]/mL (ref 0.41–5.90)

## 2012-02-29 ENCOUNTER — Other Ambulatory Visit: Payer: Self-pay | Admitting: *Deleted

## 2012-02-29 MED ORDER — HYDROCODONE-ACETAMINOPHEN 10-325 MG PO TABS: 1.0000 | ORAL_TABLET | Freq: Four times a day (QID) | ORAL | Status: DC | PRN

## 2012-03-27 ENCOUNTER — Encounter: Payer: Self-pay | Admitting: Family Medicine

## 2012-03-27 ENCOUNTER — Ambulatory Visit (INDEPENDENT_AMBULATORY_CARE_PROVIDER_SITE_OTHER): Payer: Medicare HMO | Admitting: Family Medicine

## 2012-03-27 VITALS — BP 122/78 | HR 94 | Ht 65.0 in | Wt 121.0 lb

## 2012-03-27 DIAGNOSIS — Z9181 History of falling: Secondary | ICD-10-CM

## 2012-03-27 DIAGNOSIS — N760 Acute vaginitis: Secondary | ICD-10-CM

## 2012-03-27 DIAGNOSIS — Z0000 Encounter for general adult medical examination without abnormal findings: Secondary | ICD-10-CM

## 2012-03-27 DIAGNOSIS — Z23 Encounter for immunization: Secondary | ICD-10-CM

## 2012-03-27 DIAGNOSIS — Z1331 Encounter for screening for depression: Secondary | ICD-10-CM

## 2012-03-27 DIAGNOSIS — E785 Hyperlipidemia, unspecified: Secondary | ICD-10-CM

## 2012-03-27 DIAGNOSIS — N390 Urinary tract infection, site not specified: Secondary | ICD-10-CM

## 2012-03-27 LAB — POCT URINALYSIS DIPSTICK
Bilirubin, UA: NEGATIVE
Glucose, UA: NEGATIVE
Ketones, UA: NEGATIVE
Nitrite, UA: POSITIVE
Protein, UA: NEGATIVE
Spec Grav, UA: 1.015
Urobilinogen, UA: 0.2
pH, UA: 6

## 2012-03-27 MED ORDER — SULFAMETHOXAZOLE-TRIMETHOPRIM 800-160 MG PO TABS: 1.0000 | ORAL_TABLET | Freq: Two times a day (BID) | ORAL | Status: DC

## 2012-03-27 NOTE — Progress Notes (Signed)
Subjective:    Heather Perkins is a 78 y.o. female who presents for Medicare Annual/Subsequent preventive examination.    She has been having pelvic pain for the last 2-3 weeks since she she had intercourse. No vaginal discharge. No bleeding. She has had some external discomfort after urination. No fever. Some pelvic discomfort.  Preventive Screening-Counseling & Management  Tobacco History  Smoking status  . Current Everyday Smoker -- 0.5 packs/day for 50 years  . Types: Cigarettes  Smokeless tobacco  . Not on file     Problems Prior to Visit 1.   Current Problems (verified) Patient Active Problem List  Diagnosis  . Bipolar disorder  . Hypothyroidism  . DDD (degenerative disc disease), cervical  . Hypercholesteremia  . GERD (gastroesophageal reflux disease)    Medications Prior to Visit Current Outpatient Prescriptions on File Prior to Visit  Medication Sig Dispense Refill  . ALPRAZolam (XANAX) 0.5 MG tablet Take 1 tablet (0.5 mg total) by mouth every 6 (six) hours as needed.  100 tablet  0  . cyclobenzaprine (FLEXERIL) 10 MG tablet Take 1 tablet (10 mg total) by mouth 3 (three) times daily as needed.  90 tablet  0  . fish oil-omega-3 fatty acids 1000 MG capsule Take 2 g by mouth daily.      Marland Kitchen HYDROcodone-acetaminophen (NORCO) 10-325 MG per tablet Take 1 tablet by mouth every 6 (six) hours as needed.  90 tablet  0  . magnesium 30 MG tablet Take 30 mg by mouth 2 (two) times daily.      . nitrofurantoin, macrocrystal-monohydrate, (MACROBID) 100 MG capsule Take 1 capsule (100 mg total) by mouth as needed. For urinary tract prophylaxis.  1 capsule  0  . polyethylene glycol (MIRALAX / GLYCOLAX) packet Take 17 g by mouth daily.        Marland Kitchen propylthiouracil (PTU) 50 MG tablet Take 50 mg by mouth 2 (two) times daily.        . Vitamins A & D 5000-400 UNITS CAPS Take by mouth.          Current Medications (verified) Current Outpatient Prescriptions  Medication Sig Dispense Refill  .  ALPRAZolam (XANAX) 0.5 MG tablet Take 1 tablet (0.5 mg total) by mouth every 6 (six) hours as needed.  100 tablet  0  . cyclobenzaprine (FLEXERIL) 10 MG tablet Take 1 tablet (10 mg total) by mouth 3 (three) times daily as needed.  90 tablet  0  . fish oil-omega-3 fatty acids 1000 MG capsule Take 2 g by mouth daily.      Marland Kitchen HYDROcodone-acetaminophen (NORCO) 10-325 MG per tablet Take 1 tablet by mouth every 6 (six) hours as needed.  90 tablet  0  . magnesium 30 MG tablet Take 30 mg by mouth 2 (two) times daily.      . nitrofurantoin, macrocrystal-monohydrate, (MACROBID) 100 MG capsule Take 1 capsule (100 mg total) by mouth as needed. For urinary tract prophylaxis.  1 capsule  0  . polyethylene glycol (MIRALAX / GLYCOLAX) packet Take 17 g by mouth daily.        Marland Kitchen propylthiouracil (PTU) 50 MG tablet Take 50 mg by mouth 2 (two) times daily.        . Vitamins A & D 5000-400 UNITS CAPS Take by mouth.           Allergies (verified) Azithromycin; Codeine; Crestor; Elavil; Livalo; and Neosporin   PAST HISTORY  Family History No family history on file.  Social History History  Substance  Use Topics  . Smoking status: Current Everyday Smoker -- 0.5 packs/day for 50 years    Types: Cigarettes  . Smokeless tobacco: Not on file  . Alcohol Use: Not on file     Are there smokers in your home (other than you)? Yes  Risk Factors Current exercise habits: yard work.   Dietary issues discussed: Was doing well with her diet until a month ago. Says only eats 2 meals a day.     Cardiac risk factors: advanced age (older than 58 for men, 28 for women), hypertension, sedentary lifestyle and smoking/ tobacco exposure.  Depression Screen (Note: if answer to either of the following is "Yes", a more complete depression screening is indicated)   Over the past two weeks, have you felt down, depressed or hopeless? No  Over the past two weeks, have you felt little interest or pleasure in doing things? No  Have you  lost interest or pleasure in daily life? No  Do you often feel hopeless? No  Do you cry easily over simple problems? No  Activities of Daily Living In your present state of health, do you have any difficulty performing the following activities?:  Driving? No Managing money?  No Feeding yourself? No Getting from bed to chair? No Climbing a flight of stairs? No Preparing food and eating?: No Bathing or showering? No Getting dressed: No Getting to the toilet? No Using the toilet:No Moving around from place to place: No In the past year have you fallen or had a near fall?:Yes, near fall after not eating.     Are you sexually active?  Yes  Do you have more than one partner?  No  Hearing Difficulties: Yes Do you often ask people to speak up or repeat themselves? Yes Do you experience ringing or noises in your ears? Yes Do you have difficulty understanding soft or whispered voices? Yes   Do you feel that you have a problem with memory? No  Do you often misplace items? No  Do you feel safe at home?  Yes  Cognitive Testing  Alert? Yes  Normal Appearance?Yes  Oriented to person? Yes  Place? Yes   Time? Yes  Recall of three objects?  Yes  Can perform simple calculations? Yes  Displays appropriate judgment?Yes  Can read the correct time from a watch face?Yes   Advanced Directives have been discussed with the patient? Yes  List the Names of Other Physician/Practitioners you currently use: 1.  Dr. Jamelle Rushing follows her thyroid  Indicate any recent Medical Services you may have received from other than [facility] providers in the past year (date may be approximate).   There is no immunization history on file for this patient.  Screening Tests Health Maintenance  Topic Date Due  . Tetanus/tdap  11/17/1954  . Colonoscopy  11/17/1985  . Zostavax  11/18/1995  . Influenza Vaccine  07/09/2012  . Pneumococcal Polysaccharide Vaccine Age 53 And Over  Addressed    All answers were reviewed with  the patient and necessary referrals were made:  ,, MD   03/27/2012   History reviewed: allergies, current medications, past family history, past medical history, past social history, past surgical history and problem list  Review of Systems A comprehensive review of systems was negative.    Objective:     Vision by Snellen chart: right eye:20/70, left eye:20/40  Body mass index is 20.14 kg/(m^2). BP 122/78  Pulse 94  Ht 5\' 5"  (1.651 m)  Wt 121 lb (54.885 kg)  BMI 20.14 kg/m2  SpO2 98%  BP 122/78  Pulse 94  Ht 5\' 5"  (1.651 m)  Wt 121 lb (54.885 kg)  BMI 20.14 kg/m2  SpO2 98%  General Appearance:    Alert, cooperative, no distress, appears stated age  Head:    Normocephalic, without obvious abnormality, atraumatic  Eyes:    PERRL, conjunctiva/corneas clear, EOM's intact, both eyes  Ears:    Normal TM's and external ear canals, both ears  Nose:   Nares normal, septum midline, mucosa normal, no drainage    or sinus tenderness  Throat:   Lips, mucosa, and tongue normal;dentures in place gums normal  Neck:   Supple, symmetrical, trachea midline, no adenopathy;    thyroid:  no enlargement/tenderness/nodules; no carotid   bruit or JVD  Back:     Symmetric, no curvature, ROM normal, no CVA tenderness  Lungs:     Clear to auscultation bilaterally, respirations unlabored  Chest Wall:    No tenderness or deformity   Heart:    Regular rate and rhythm, S1 and S2 normal, no murmur, rub   or gallop  Breast Exam:    No tenderness, masses, or nipple abnormality  Abdomen:     Soft, non-tender, bowel sounds active all four quadrants,    no masses, no organomegaly  Genitalia:    Normal female without lesion, discharge or tenderness  Rectal:    Normal tone, normal prostate, no masses or tenderness;   guaiac negative stool  Extremities:   Extremities normal, atraumatic, no cyanosis or edema  Pulses:   2+ and symmetric all extremities  Skin:   Skin color, texture, turgor  normal, no rashes or lesions  Lymph nodes:   Cervical, supraclavicular, and axillary nodes normal  Neurologic:   CNII-XII intact, normal strength, sensation and reflexes    throughout       Assessment:     Annual Wellness EXam.      Plan:     During the course of the visit the patient was educated and counseled about appropriate screening and preventive services including:    Td vaccine  shingles vaccine - She has had shingles so doesn't want to get vaccine since benefit is questionable.   Pelvic Pain - will prep collected and will send. Also urinalysis performed. She does not have a uterus.  Encouraged smoking cessation  UA + for UTI. Will send rx for bactrim BID x 3 days. Call if nto better in 3 days.   Poor vision-she says she did not get a chance to get her eye drops in this morning because she overslept. Make sure keep her regular scheduled follow up with her eye doctor.  Diet review for nutrition referral? Yes ____  Not Indicated __x__   Patient Instructions (the written plan) was given to the patient.  Medicare Attestation I have personally reviewed: The patient's medical and social history Their use of alcohol, tobacco or illicit drugs Their current medications and supplements The patient's functional ability including ADLs,fall risks, home safety risks, cognitive, and hearing and visual impairment Diet and physical activities Evidence for depression or mood disorders  The patient's weight, height, BMI, and visual acuity have been recorded in the chart.  I have made referrals, counseling, and provided education to the patient based on review of the above and I have provided the patient with a written personalized care plan for preventive services.     ,, MD   03/27/2012

## 2012-03-27 NOTE — Patient Instructions (Addendum)
Start a regular exercise program and make sure you are eating a healthy diet Try to eat 4 servings of dairy a day  Your vaccines are up to date.   

## 2012-03-28 LAB — LIPID PANEL
Cholesterol: 279 mg/dL — ABNORMAL HIGH (ref 0–200)
HDL: 51 mg/dL (ref >39)
LDL Cholesterol: 187 mg/dL — ABNORMAL HIGH (ref 0–99)
Total CHOL/HDL Ratio: 5.5 Ratio
Triglycerides: 205 mg/dL — ABNORMAL HIGH (ref <150)
VLDL: 41 mg/dL — ABNORMAL HIGH (ref 0–40)

## 2012-03-28 LAB — WET PREP FOR TRICH, YEAST, CLUE
Clue Cells Wet Prep HPF POC: NONE SEEN
Trich, Wet Prep: NONE SEEN
WBC, Wet Prep HPF POC: NONE SEEN
Yeast Wet Prep HPF POC: NONE SEEN

## 2012-03-28 LAB — COMPLETE METABOLIC PANEL WITH GFR
ALT: 9 U/L (ref 0–35)
AST: 16 U/L (ref 0–37)
Albumin: 4.7 g/dL (ref 3.5–5.2)
Alkaline Phosphatase: 76 U/L (ref 39–117)
BUN: 20 mg/dL (ref 6–23)
CO2: 24 mEq/L (ref 19–32)
Calcium: 10 mg/dL (ref 8.4–10.5)
Chloride: 106 mEq/L (ref 96–112)
Creat: 0.89 mg/dL (ref 0.50–1.10)
GFR, Est African American: 73 mL/min
GFR, Est Non African American: 63 mL/min
Glucose, Bld: 101 mg/dL — ABNORMAL HIGH (ref 70–99)
Potassium: 4.2 mEq/L (ref 3.5–5.3)
Sodium: 139 mEq/L (ref 135–145)
Total Bilirubin: 0.6 mg/dL (ref 0.3–1.2)
Total Protein: 7.6 g/dL (ref 6.0–8.3)

## 2012-04-05 ENCOUNTER — Ambulatory Visit (INDEPENDENT_AMBULATORY_CARE_PROVIDER_SITE_OTHER): Payer: Medicare HMO | Admitting: Physician Assistant

## 2012-04-05 ENCOUNTER — Encounter: Payer: Self-pay | Admitting: Physician Assistant

## 2012-04-05 VITALS — BP 127/76 | HR 83 | Temp 98.4°F | Ht 62.0 in | Wt 123.0 lb

## 2012-04-05 DIAGNOSIS — R300 Dysuria: Secondary | ICD-10-CM

## 2012-04-05 LAB — POCT URINALYSIS DIPSTICK
Bilirubin, UA: NEGATIVE
Blood, UA: NEGATIVE
Glucose, UA: NEGATIVE
Ketones, UA: 15
Nitrite, UA: POSITIVE
Protein, UA: NEGATIVE
Spec Grav, UA: 1.015
Urobilinogen, UA: 0.2
pH, UA: 7

## 2012-04-05 MED ORDER — CIPROFLOXACIN HCL 500 MG PO TABS: 500.0000 mg | ORAL_TABLET | Freq: Two times a day (BID) | ORAL | Status: AC

## 2012-04-05 NOTE — Patient Instructions (Addendum)
Start Cipro twice a day for 7 days. Will call with culture results on Monday. Call if symptoms don't resolve.

## 2012-04-08 ENCOUNTER — Other Ambulatory Visit: Payer: Self-pay | Admitting: *Deleted

## 2012-04-08 LAB — URINE CULTURE: Colony Count: >=100000

## 2012-04-08 MED ORDER — ALPRAZOLAM 0.5 MG PO TABS: 0.5000 mg | ORAL_TABLET | Freq: Four times a day (QID) | ORAL | Status: DC | PRN

## 2012-04-08 MED ORDER — HYDROCODONE-ACETAMINOPHEN 10-325 MG PO TABS: 1.0000 | ORAL_TABLET | Freq: Four times a day (QID) | ORAL | Status: DC | PRN

## 2012-04-08 NOTE — Progress Notes (Signed)
  Subjective:    Patient ID: Heather Perkins, female    DOB: 1934/09/03, 78 y.o.   MRN: [id number]  HPI Pt presents to the clinic with dysuria, increased frequency and a lower abdominal pressure. She was just treated for a UTI on the 19th by Dr. Linford Arnold. She has had a hx of UTI associated with sex. She was previously on prophyaxis after intercourse. She recently was sexually active and she had not been in a while because she is trying to end that relationship. For the last 2-3 days she has had painful urination and just feels like she has a UTI. She denies fever, chills, myagias.She denies any GI symptoms. No vaginal discharge.  She also reports a rash on the right side of her back and neck. It is very itchy but not painful. It is where she had shingles previously. It was very red and irritated she has put some natural cream on it and has gone away today.    Review of Systems     Objective:   Physical Exam  Constitutional: She is oriented to person, place, and time. She appears well-developed and well-nourished.  HENT:  Head: Normocephalic and atraumatic.  Cardiovascular: Normal rate, regular rhythm and normal heart sounds.   Pulmonary/Chest: Effort normal and breath sounds normal. She has no wheezes.  Neurological: She is alert and oriented to person, place, and time.  Skin:       NO rash to be seen today.  Psychiatric: She has a normal mood and affect. Her behavior is normal.          Assessment & Plan:  UTI- UA + for nitrites and leukocytes. Sent for culture. Gave Cipro will call if we need to make any changes due to resistance. Discussed need for prophyalaxis if she is going to be sexually active. She does not want to start right now because she wants to stop having sex with this particular partner.   Depression- She seems depressed today. We talked about starting an anti-depressant medication but she declined today. Encouraged her to get out of house and hang out with friends. She  likes shag dancing so I encouraged her to find an club where she could be a part of. Follow up as needed.  RAsh- Could have been some type of contact dermatitis. Since has resolved it is very unlikely that it is shingles. Continue to put natural cream on it if it helped. Follow up if rash comes back or becomes painful.

## 2012-04-15 ENCOUNTER — Encounter: Payer: Self-pay | Admitting: *Deleted

## 2012-05-09 ENCOUNTER — Other Ambulatory Visit: Payer: Self-pay | Admitting: *Deleted

## 2012-05-09 MED ORDER — HYDROCODONE-ACETAMINOPHEN 10-325 MG PO TABS: 1.0000 | ORAL_TABLET | Freq: Four times a day (QID) | ORAL | Status: DC | PRN

## 2012-05-30 ENCOUNTER — Other Ambulatory Visit: Payer: Self-pay | Admitting: *Deleted

## 2012-05-30 MED ORDER — ALPRAZOLAM 0.5 MG PO TABS: 0.5000 mg | ORAL_TABLET | Freq: Four times a day (QID) | ORAL | Status: DC | PRN

## 2012-06-11 ENCOUNTER — Other Ambulatory Visit: Payer: Self-pay | Admitting: *Deleted

## 2012-06-11 MED ORDER — HYDROCODONE-ACETAMINOPHEN 10-325 MG PO TABS: 1.0000 | ORAL_TABLET | Freq: Four times a day (QID) | ORAL | Status: DC | PRN

## 2012-07-04 ENCOUNTER — Other Ambulatory Visit: Payer: Self-pay | Admitting: Family Medicine

## 2012-07-04 DIAGNOSIS — Z9289 Personal history of other medical treatment: Secondary | ICD-10-CM

## 2012-07-08 LAB — T4
Free Thyroxine Index: 2.5
T3 Uptake Ratio: 25
T4, Total: 10

## 2012-07-08 LAB — TSH: TSH: 1.47 u[IU]/mL (ref 0.41–5.90)

## 2012-07-09 ENCOUNTER — Ambulatory Visit (INDEPENDENT_AMBULATORY_CARE_PROVIDER_SITE_OTHER): Payer: Medicare HMO

## 2012-07-09 DIAGNOSIS — Z1231 Encounter for screening mammogram for malignant neoplasm of breast: Secondary | ICD-10-CM

## 2012-07-09 DIAGNOSIS — Z9289 Personal history of other medical treatment: Secondary | ICD-10-CM

## 2012-07-12 ENCOUNTER — Other Ambulatory Visit: Payer: Self-pay | Admitting: *Deleted

## 2012-07-12 MED ORDER — HYDROCODONE-ACETAMINOPHEN 10-325 MG PO TABS: 1.0000 | ORAL_TABLET | Freq: Four times a day (QID) | ORAL | Status: DC | PRN

## 2012-07-25 ENCOUNTER — Ambulatory Visit (INDEPENDENT_AMBULATORY_CARE_PROVIDER_SITE_OTHER): Payer: Medicare HMO | Admitting: Family Medicine

## 2012-07-25 ENCOUNTER — Encounter: Payer: Self-pay | Admitting: Family Medicine

## 2012-07-25 VITALS — BP 140/86 | HR 99 | Temp 98.6°F | Ht 62.0 in | Wt 121.0 lb

## 2012-07-25 DIAGNOSIS — N390 Urinary tract infection, site not specified: Secondary | ICD-10-CM

## 2012-07-25 DIAGNOSIS — R109 Unspecified abdominal pain: Secondary | ICD-10-CM

## 2012-07-25 LAB — POCT URINALYSIS DIPSTICK
Bilirubin, UA: NEGATIVE
Blood, UA: NEGATIVE
Glucose, UA: NEGATIVE
Ketones, UA: NEGATIVE
Leukocytes, UA: NEGATIVE
Nitrite, UA: POSITIVE
Protein, UA: NEGATIVE
Spec Grav, UA: 1.01
Urobilinogen, UA: 0.2
pH, UA: 7

## 2012-07-25 MED ORDER — CIPROFLOXACIN HCL 250 MG PO TABS: ORAL_TABLET | ORAL | Status: AC

## 2012-07-25 MED ORDER — PHENAZOPYRIDINE HCL 200 MG PO TABS: 200.0000 mg | ORAL_TABLET | Freq: Three times a day (TID) | ORAL | Status: DC | PRN

## 2012-07-25 NOTE — Progress Notes (Signed)
CC: Heather Perkins is a 78 y.o. female is here for Abdominal Pain   Subjective: HPI:  Patient accompanied by daughter, complains of lower abdominal pain which started as a on and off spasm started 2 days ago has now evolved to involve the right pelvis radiating into the right hip and in to the rectum. Pain is brought on without warning but seems to be somewhat precipitated by walking or moving from a seated position. She denies gastrointestinal complaints and reports daily painless bowel movements. She denies nausea nor vomiting. There's been no fevers or chills. She admits to urinary hesitancy and frequency and notes that she has a urge to urinate but often nothing comes out. She denies color or odor or consistency changes of her urine. She denies a history of kidney stones nor recent flank pain. She's a history of hysterectomy and denies any gynecological complaints recently.   Review Of Systems Outlined In HPI  Past Medical History  Diagnosis Date  . Fibromyalgia   . Depression   . Allergy   . GERD (gastroesophageal reflux disease)   . Hyperthyroidism 2010    sees Dr Louie Boston, did RAI  . Colitis, ischemic   . Shingles      No family history on file.   History  Substance Use Topics  . Smoking status: Current Every Day Smoker -- 0.5 packs/day for 50 years    Types: Cigarettes  . Smokeless tobacco: Not on file  . Alcohol Use: Not on file     Objective: Filed Vitals:   07/25/12 1353  BP: 140/86  Pulse: 99  Temp: 98.6 F (37 C)    General: Alert and Oriented, No Acute Distress HEENT: Pupils equal, round, reactive to light. Conjunctivae clear. Moist mucous membranes,  Lungs: Clear to auscultation bilaterally, no wheezing/ronchi/rales.  Comfortable work of breathing. Good air movement. Cardiac: Regular rate and rhythm. Normal S1/S2.  No murmurs, rubs, nor gallops.   Abdomen: Soft nontender to palpation without palpable masses  Mental Status: No depression, anxiety, nor  agitation. No signs of confusion Back: No CVA tenderness  Assessment & Plan: Heather Perkins was seen today for abdominal pain.  Diagnoses and associated orders for this visit:  Abdominal pain - Urinalysis Dipstick  Uti (lower urinary tract infection) - Urine culture - ciprofloxacin (CIPRO) 250 MG tablet; Take one by mouth twice a day for five days. - phenazopyridine (PYRIDIUM) 200 MG tablet; Take 1 tablet (200 mg total) by mouth 3 (three) times daily as needed for pain.    UA with positive nitrite suggestive of urinary tract infection. No blood therefore suspicion of kidney stone low on the differential. We'll start Cipro as an Escherichia coli in the past was sensitive to this. She's had 2 UTIs in the past 6 months it culture confirms my suspicion. I've asked to return in 2 weeks we can get another culture and if negative we can consider daily prophylactic therapy, second-generation cephalosporin would be appropriate pending today's culture. I've asked her to call tomorrow afternoon if after 2 doses conditions continued to decline I will follow the culture.  Return if symptoms worsen or fail to improve.

## 2012-07-28 LAB — URINE CULTURE: Colony Count: 5000

## 2012-07-29 ENCOUNTER — Telehealth: Payer: Self-pay | Admitting: Family Medicine

## 2012-07-29 NOTE — Telephone Encounter (Signed)
Called patient to discuss culture results, she reports "feeling somewhat better, had a chill last night, feels like something is still poking my right hip".  I've asked her to come in tomorrow if she's still not feeling better by then.

## 2012-07-30 ENCOUNTER — Encounter: Payer: Self-pay | Admitting: Family Medicine

## 2012-07-30 ENCOUNTER — Ambulatory Visit (INDEPENDENT_AMBULATORY_CARE_PROVIDER_SITE_OTHER): Payer: Medicare HMO | Admitting: Family Medicine

## 2012-07-30 ENCOUNTER — Ambulatory Visit (INDEPENDENT_AMBULATORY_CARE_PROVIDER_SITE_OTHER): Payer: Medicare HMO

## 2012-07-30 VITALS — BP 147/79 | HR 84 | Temp 98.1°F | Wt 122.0 lb

## 2012-07-30 DIAGNOSIS — M7061 Trochanteric bursitis, right hip: Secondary | ICD-10-CM

## 2012-07-30 DIAGNOSIS — M25551 Pain in right hip: Secondary | ICD-10-CM

## 2012-07-30 DIAGNOSIS — R1031 Right lower quadrant pain: Secondary | ICD-10-CM

## 2012-07-30 DIAGNOSIS — M76899 Other specified enthesopathies of unspecified lower limb, excluding foot: Secondary | ICD-10-CM

## 2012-07-30 DIAGNOSIS — M25559 Pain in unspecified hip: Secondary | ICD-10-CM

## 2012-07-30 DIAGNOSIS — M169 Osteoarthritis of hip, unspecified: Secondary | ICD-10-CM

## 2012-07-30 MED ORDER — TRIAMCINOLONE ACETONIDE 40 MG/ML IJ SUSP
20.0000 mg | Freq: Once | INTRAMUSCULAR | Status: AC
  Administered 2012-07-30: 20 mg via INTRAMUSCULAR

## 2012-07-30 NOTE — Progress Notes (Signed)
CC: Heather Perkins is a 78 y.o. female is here for lower abd pain   Subjective: HPI:  Patient reports for followup of right lower and suprapubic pain that was bothering her last week.be secondary to urinary tract infection. She's just finished Cipro regimen and states that hesitancy and urgency is now somewhat improved. She states that she says continued right groin and hip pain. It is worse with walking distances longer than a hallway, and if she rolls over on her right hip at night it is common to wake her up. There are positions that make the pain better however she has trouble describing them to today's visit. The pain does not radiate, she denies any right leg pain or knee pain. She tells me she has a long history of low back pain but she feels it is well-controlled this time. She denies any trauma or overexertion recently or remotely. She denies any change in gait, close calls to falling, nor falls. The pain response to ibuprofen and occasional hydrocodone use. Her appetite has decreased however she's tolerating food when she does eat denying nausea nor vomiting. She points out that since she saw me last she's only had one bowel movement which is somewhat abnormal, it was nonbloody and non-tar-like, she has started taking MiraLax and stool softeners to try get her bowels moving. She tells me that she does not have an appendix, and that she had a total hysterectomy without any other abdominal surgeries. She continues to pass gas.  Review Of Systems Outlined In HPI  Past Medical History  Diagnosis Date  . Fibromyalgia   . Depression   . Allergy   . GERD (gastroesophageal reflux disease)   . Hyperthyroidism 2010    sees Dr Louie Boston, did RAI  . Colitis, ischemic   . Shingles      No family history on file.   History  Substance Use Topics  . Smoking status: Current Every Day Smoker -- 0.5 packs/day for 50 years    Types: Cigarettes  . Smokeless tobacco: Not on file  . Alcohol Use: Not on  file     Objective: Filed Vitals:   07/30/12 1422  BP: 147/79  Pulse: 84  Temp: 98.1 F (36.7 C)    General: Alert and Oriented, No Acute Distress Lungs: Clear to auscultation bilaterally, no wheezing/ronchi/rales.  Comfortable work of breathing. Good air movement. Cardiac: Regular rate and rhythm. Normal S1/S2.  No murmurs, rubs, nor gallops.   Abdomen: Normal bowel sounds, soft and non tender without palpable masses. Extremities: No peripheral edema.  Strong peripheral pulses. Full strength and range of motion in the right lower extremity. Straight leg raise negative, no pain with log roll, pain is reproduced with FABER and is not reproduced with FADIR, no pain with femoral grind, no pain with palpation of the lumbar back at the midline nor over the right SI joint. Pain is reproduced with palpation of the right greater trochanter. Mental Status: No depression, anxiety, nor agitation. Skin: Warm and dry.  Assessment & Plan: Terrilynn was seen today for lower abd pain.  Diagnoses and associated orders for this visit:  Rlq abdominal pain - Urine culture  Greater trochanteric bursitis of right hip - triamcinolone acetonide (KENALOG-40) injection 20 mg; Inject 0.5 mLs (20 mg total) into the muscle once.  Right hip pain - DG Hip Complete Right; Future     with resolution of her urinary symptoms am surprised she didn't have anything grow out on the culture, since she  still reports some hesitancy and urgency I will resend a culture. Low suspicion for intra-abdominal process, we'll get hip films of the right hip to determine her degree of osteoarthritis which she tells me she's had a remote diagnosis of that hip. Pain was reproduced to a degree with palpation of the right greater trochanter. Verbal consent was obtained for steroid injection to the greater trochanter bursa. Sites located by anatomical landmarks and prepped with alcohol swabs. 20 mg of Kenalog and 3 mL of 1% lidocaine were  introduced using a 22-gauge needle, syringe was pulled back for aspiration without any return. The above solution was introduced without resistance to the bursal area. Patient tolerated procedure well with no bleeding and a Band-Aid was placed over the insertion site. No complications. I've asked her to continue her ibuprofen hydrocodone regimen at home he'll call her when results and x-ray are available for further plans.Signs and symptoms requring emergent/urgent reevaluation were discussed with the patient.  25 minutes spent in face-to-face visit today of which at least 80% was counseling or coordinating care.

## 2012-08-01 LAB — URINE CULTURE: Colony Count: 55000

## 2012-08-08 ENCOUNTER — Ambulatory Visit (INDEPENDENT_AMBULATORY_CARE_PROVIDER_SITE_OTHER): Payer: Medicare HMO | Admitting: Family Medicine

## 2012-08-08 ENCOUNTER — Encounter: Payer: Self-pay | Admitting: Family Medicine

## 2012-08-08 VITALS — BP 104/64 | HR 84 | Wt 118.0 lb

## 2012-08-08 DIAGNOSIS — M25551 Pain in right hip: Secondary | ICD-10-CM | POA: Insufficient documentation

## 2012-08-08 DIAGNOSIS — N390 Urinary tract infection, site not specified: Secondary | ICD-10-CM

## 2012-08-08 DIAGNOSIS — M25559 Pain in unspecified hip: Secondary | ICD-10-CM

## 2012-08-08 NOTE — Progress Notes (Signed)
CC: Heather Perkins is a 78 y.o. female is here for Follow-up   Subjective: HPI:  Patient presents for followup of urinary tract infection mid October. Which was treated with Cipro, culture failed to show a single organism significant colony count however her symptoms drastically improved after starting Cipro. She would like to know if suppressive therapy is warranted since she had a urinary tract infection back in June and it sounds like she even had a urinary tract infection soon before that from an outside clinic. Today she denies dysuria, frequency, urgency, nor change in the color or consistency of her urine. Denies fevers, chills nor flank pain.  When I saw her last week she was complaining of right hip pain, and x-ray showed moderate osteoarthritis in the hip and mild degenerative changes of the SI joint. She received steroid injection over the right greater trochanter and claims that she feels fantastic from a hip perspective. Her pain continues to be a 4/10 but she states that she is not particularly bothered by this degree of pain. She is unable to predict participate what movements bring on the pain. They often come out without warning. The pain is localized to the superior aspect of the right buttock that sometimes radiates forward. She denies any motor or sensory disturbances the right lower extremity, recent falls, recent trauma, bulges in the groin region, nor genitourinary complaints. Her bowels are moving once a day and are painless.   Review Of Systems Outlined In HPI  Past Medical History  Diagnosis Date  . Fibromyalgia   . Depression   . Allergy   . GERD (gastroesophageal reflux disease)   . Hyperthyroidism 2010    sees Dr Louie Boston, did RAI  . Colitis, ischemic   . Shingles      No family history on file.   History  Substance Use Topics  . Smoking status: Current Every Day Smoker -- 0.5 packs/day for 50 years    Types: Cigarettes  . Smokeless tobacco: Not on file  .  Alcohol Use: Not on file     Objective: Filed Vitals:   08/08/12 1448  BP: 104/64  Pulse: 84    General: Alert and Oriented, No Acute Distress Lungs: Clear to auscultation bilaterally, no wheezing/ronchi/rales.  Comfortable work of breathing. Good air movement. Cardiac: Regular rate and rhythm. Normal S1/S2.  No murmurs, rubs, nor gallops.   Abdomen: Normal bowel sounds, soft and non tender without palpable masses. Extremities: No peripheral edema.  Strong peripheral pulses. Groin pain is not reproduced with internal or external rotation of the femur, straight leg raise is negative. Full-strength and range of motion in the right hip and knee. Pain is reproduced with palpation of the right SI joint. Mental Status: No depression, anxiety, nor agitation. Skin: Warm and dry.  Assessment & Plan: Heather Perkins was seen today for follow-up.  Diagnoses and associated orders for this visit:  Recurrent uti - Urine culture  Right hip pain    We'll get a urine culture today, if negative will call her for consideration of suppression antibiotic regimen. For her hip pain I believe some of this is from her SI joint, I offered a consult to Dr. Karie Schwalbe. for consideration of an SI joint injection however she declines at this time. She tells me she's taking Celebrex and many other nonsteroidal anti-inflammatories in the past without much improvement and would not like to try any of them again. She states she is pretty happy with the degree of pain she has  now and will let me know if deteriorates  Return in about 3 months (around 11/08/2012).

## 2012-08-10 LAB — URINE CULTURE

## 2012-08-13 ENCOUNTER — Ambulatory Visit: Payer: Medicare HMO | Admitting: Family Medicine

## 2012-08-13 DIAGNOSIS — N390 Urinary tract infection, site not specified: Secondary | ICD-10-CM

## 2012-08-13 NOTE — Progress Notes (Signed)
Will follow.

## 2012-08-13 NOTE — Progress Notes (Signed)
  Subjective:    Patient ID: Heather Perkins, female    DOB: [birth year], 78 y.o.   MRN: [id number]  HPI   HEre for a urine culture Review of Systems     Objective:   Physical Exam        Assessment & Plan:

## 2012-08-15 ENCOUNTER — Other Ambulatory Visit: Payer: Self-pay | Admitting: *Deleted

## 2012-08-15 LAB — URINE CULTURE
Colony Count: NO GROWTH
Organism ID, Bacteria: NO GROWTH

## 2012-08-16 ENCOUNTER — Other Ambulatory Visit: Payer: Self-pay | Admitting: *Deleted

## 2012-08-16 MED ORDER — HYDROCODONE-ACETAMINOPHEN 10-325 MG PO TABS: 1.0000 | ORAL_TABLET | Freq: Four times a day (QID) | ORAL | Status: DC | PRN

## 2012-08-19 ENCOUNTER — Telehealth: Payer: Self-pay | Admitting: Family Medicine

## 2012-08-19 MED ORDER — CIPROFLOXACIN HCL 250 MG PO TABS: ORAL_TABLET | ORAL | Status: DC

## 2012-08-19 NOTE — Telephone Encounter (Signed)
Pt notified and voiced understanding 

## 2012-08-19 NOTE — Telephone Encounter (Signed)
Sue Lush,  Will you please let Mrs. Meditz know that the urine culture from last week did not show any bacteria, this is good news, and if she'd like to go on a daily antibiotic to suppress future UTIs she can take a daily cipro that I sent to [facility].  She doesn't necessarily have to start this if she's having second thoughts, I'll leave the decision up to her.

## 2012-08-26 ENCOUNTER — Other Ambulatory Visit: Payer: Self-pay | Admitting: *Deleted

## 2012-08-26 MED ORDER — ALPRAZOLAM 0.5 MG PO TABS: 0.5000 mg | ORAL_TABLET | Freq: Four times a day (QID) | ORAL | Status: DC | PRN

## 2012-09-18 ENCOUNTER — Telehealth: Payer: Self-pay | Admitting: Physician Assistant

## 2012-09-18 MED ORDER — HYDROCODONE-ACETAMINOPHEN 10-325 MG PO TABS: 1.0000 | ORAL_TABLET | Freq: Four times a day (QID) | ORAL | Status: DC | PRN

## 2012-09-18 NOTE — Telephone Encounter (Signed)
Sent refill to pharmacy for 1 month.

## 2012-10-22 ENCOUNTER — Other Ambulatory Visit: Payer: Self-pay | Admitting: *Deleted

## 2012-10-22 MED ORDER — HYDROCODONE-ACETAMINOPHEN 10-325 MG PO TABS: 1.0000 | ORAL_TABLET | Freq: Four times a day (QID) | ORAL | Status: DC | PRN

## 2012-10-30 ENCOUNTER — Other Ambulatory Visit: Payer: Self-pay

## 2012-10-30 MED ORDER — ALPRAZOLAM 0.5 MG PO TABS: 0.5000 mg | ORAL_TABLET | Freq: Four times a day (QID) | ORAL | Status: DC | PRN

## 2012-11-27 ENCOUNTER — Other Ambulatory Visit: Payer: Self-pay

## 2012-11-27 MED ORDER — HYDROCODONE-ACETAMINOPHEN 10-325 MG PO TABS: 1.0000 | ORAL_TABLET | Freq: Four times a day (QID) | ORAL | Status: DC | PRN

## 2012-11-27 MED ORDER — ALPRAZOLAM 0.5 MG PO TABS: 0.5000 mg | ORAL_TABLET | Freq: Four times a day (QID) | ORAL | Status: DC | PRN

## 2012-11-27 MED ORDER — CYCLOBENZAPRINE HCL 10 MG PO TABS: 10.0000 mg | ORAL_TABLET | Freq: Three times a day (TID) | ORAL | Status: DC | PRN

## 2012-11-27 NOTE — Telephone Encounter (Addendum)
Refused refill request for xanax. Patient needs office visit.  I called and scheduled patient for office visit.

## 2012-12-04 ENCOUNTER — Ambulatory Visit (INDEPENDENT_AMBULATORY_CARE_PROVIDER_SITE_OTHER): Payer: Medicare HMO | Admitting: Family Medicine

## 2012-12-04 ENCOUNTER — Encounter: Payer: Self-pay | Admitting: Family Medicine

## 2012-12-04 VITALS — BP 121/78 | HR 97 | Wt 121.0 lb

## 2012-12-04 DIAGNOSIS — J3489 Other specified disorders of nose and nasal sinuses: Secondary | ICD-10-CM

## 2012-12-04 DIAGNOSIS — Z720 Tobacco use: Secondary | ICD-10-CM

## 2012-12-04 DIAGNOSIS — I739 Peripheral vascular disease, unspecified: Secondary | ICD-10-CM

## 2012-12-04 DIAGNOSIS — F32A Depression, unspecified: Secondary | ICD-10-CM

## 2012-12-04 DIAGNOSIS — F329 Major depressive disorder, single episode, unspecified: Secondary | ICD-10-CM

## 2012-12-04 DIAGNOSIS — N390 Urinary tract infection, site not specified: Secondary | ICD-10-CM

## 2012-12-04 DIAGNOSIS — R0982 Postnasal drip: Secondary | ICD-10-CM

## 2012-12-04 DIAGNOSIS — F3289 Other specified depressive episodes: Secondary | ICD-10-CM

## 2012-12-04 DIAGNOSIS — F411 Generalized anxiety disorder: Secondary | ICD-10-CM

## 2012-12-04 DIAGNOSIS — F17200 Nicotine dependence, unspecified, uncomplicated: Secondary | ICD-10-CM

## 2012-12-04 NOTE — Patient Instructions (Signed)
Smoking Cessation Quitting smoking is important to your health and has many advantages. However, it is not always easy to quit since nicotine is a very addictive drug. Often times, people try 3 times or more before being able to quit. This document explains the best ways for you to prepare to quit smoking. Quitting takes hard work and a lot of effort, but you can do it. ADVANTAGES OF QUITTING SMOKING  You will live longer, feel better, and live better.  Your body will feel the impact of quitting smoking almost immediately.  Within 20 minutes, blood pressure decreases. Your pulse returns to its normal level.  After 8 hours, carbon monoxide levels in the blood return to normal. Your oxygen level increases.  After 24 hours, the chance of having a heart attack starts to decrease. Your breath, hair, and body stop smelling like smoke.  After 48 hours, damaged nerve endings begin to recover. Your sense of taste and smell improve.  After 72 hours, the body is virtually free of nicotine. Your bronchial tubes relax and breathing becomes easier.  After 2 to 12 weeks, lungs can hold more air. Exercise becomes easier and circulation improves.  The risk of having a heart attack, stroke, cancer, or lung disease is greatly reduced.  After 1 year, the risk of coronary heart disease is cut in half.  After 5 years, the risk of stroke falls to the same as a nonsmoker.  After 10 years, the risk of lung cancer is cut in half and the risk of other cancers decreases significantly.  After 15 years, the risk of coronary heart disease drops, usually to the level of a nonsmoker.  If you are pregnant, quitting smoking will improve your chances of having a healthy baby.  The people you live with, especially any children, will be healthier.  You will have extra money to spend on things other than cigarettes. QUESTIONS TO THINK ABOUT BEFORE ATTEMPTING TO QUIT You may want to talk about your answers with your  caregiver.  Why do you want to quit?  If you tried to quit in the past, what helped and what did not?  What will be the most difficult situations for you after you quit? How will you plan to handle them?  Who can help you through the tough times? Your family? Friends? A caregiver?  What pleasures do you get from smoking? What ways can you still get pleasure if you quit? Here are some questions to ask your caregiver:  How can you help me to be successful at quitting?  What medicine do you think would be best for me and how should I take it?  What should I do if I need more help?  What is smoking withdrawal like? How can I get information on withdrawal? GET READY  Set a quit date.  Change your environment by getting rid of all cigarettes, ashtrays, matches, and lighters in your home, car, or work. Do not let people smoke in your home.  Review your past attempts to quit. Think about what worked and what did not. GET SUPPORT AND ENCOURAGEMENT You have a better chance of being successful if you have help. You can get support in many ways.  Tell your family, friends, and co-workers that you are going to quit and need their support. Ask them not to smoke around you.  Get individual, group, or telephone counseling and support. Programs are available at local hospitals and health centers. Call your local health department for   information about programs in your area.  Spiritual beliefs and practices may help some smokers quit.  Download a "quit meter" on your computer to keep track of quit statistics, such as how long you have gone without smoking, cigarettes not smoked, and money saved.  Get a self-help book about quitting smoking and staying off of tobacco. LEARN NEW SKILLS AND BEHAVIORS  Distract yourself from urges to smoke. Talk to someone, go for a walk, or occupy your time with a task.  Change your normal routine. Take a different route to work. Drink tea instead of coffee.  Eat breakfast in a different place.  Reduce your stress. Take a hot bath, exercise, or read a book.  Plan something enjoyable to do every day. Reward yourself for not smoking.  Explore interactive web-based programs that specialize in helping you quit. GET MEDICINE AND USE IT CORRECTLY Medicines can help you stop smoking and decrease the urge to smoke. Combining medicine with the above behavioral methods and support can greatly increase your chances of successfully quitting smoking.  Nicotine replacement therapy helps deliver nicotine to your body without the negative effects and risks of smoking. Nicotine replacement therapy includes nicotine gum, lozenges, inhalers, nasal sprays, and skin patches. Some may be available over-the-counter and others require a prescription.  Antidepressant medicine helps people abstain from smoking, but how this works is unknown. This medicine is available by prescription.  Nicotinic receptor partial agonist medicine simulates the effect of nicotine in your brain. This medicine is available by prescription. Ask your caregiver for advice about which medicines to use and how to use them based on your health history. Your caregiver will tell you what side effects to look out for if you choose to be on a medicine or therapy. Carefully read the information on the package. Do not use any other product containing nicotine while using a nicotine replacement product.  RELAPSE OR DIFFICULT SITUATIONS Most relapses occur within the first 3 months after quitting. Do not be discouraged if you start smoking again. Remember, most people try several times before finally quitting. You may have symptoms of withdrawal because your body is used to nicotine. You may crave cigarettes, be irritable, feel very hungry, cough often, get headaches, or have difficulty concentrating. The withdrawal symptoms are only temporary. They are strongest when you first quit, but they will go away within  10 14 days. To reduce the chances of relapse, try to:  Avoid drinking alcohol. Drinking lowers your chances of successfully quitting.  Reduce the amount of caffeine you consume. Once you quit smoking, the amount of caffeine in your body increases and can give you symptoms, such as a rapid heartbeat, sweating, and anxiety.  Avoid smokers because they can make you want to smoke.  Do not let weight gain distract you. Many smokers will gain weight when they quit, usually less than 10 pounds. Eat a healthy diet and stay active. You can always lose the weight gained after you quit.  Find ways to improve your mood other than smoking. FOR MORE INFORMATION  www.smokefree.gov  Document Released: 09/19/2001 Document Revised: 03/26/2012 Document Reviewed: 01/04/2012 [employer] Patient Information 2013 [employer].  
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
## 2012-12-05 ENCOUNTER — Telehealth: Payer: Self-pay | Admitting: *Deleted

## 2012-12-05 NOTE — Telephone Encounter (Signed)
Authorization obtained for CT Sinus Maxilofacial Limited w/o contrast through [employer]. Auth # [id number] location [facility]. Linda notified. Barry Dienes, LPN

## 2012-12-06 ENCOUNTER — Ambulatory Visit (INDEPENDENT_AMBULATORY_CARE_PROVIDER_SITE_OTHER): Payer: Medicare HMO | Admitting: Family Medicine

## 2012-12-06 ENCOUNTER — Encounter: Payer: Self-pay | Admitting: Family Medicine

## 2012-12-06 VITALS — BP 126/74 | HR 89 | Wt 122.0 lb

## 2012-12-06 DIAGNOSIS — L820 Inflamed seborrheic keratosis: Secondary | ICD-10-CM

## 2012-12-06 NOTE — Progress Notes (Signed)
CC: Heather Perkins is a 79 y.o. female is here for Nevus   Subjective: HPI:  Patient reports that her massage therapist noticed a growth on her back which she in the patient or not concerned about. It is a painless mass that the patient has been unable to see. Her massage therapist describes it as something that could possibly be skin cancer. Patient denies any personal or family history of skin cancer. Denies any swollen lymph nodes, easy bruisability, shortness of breath, nor bleeding issues. When questioning her about her skin she does mention that she's had 3 pigmented growths on her face that have been causing her mild to moderate pain. The pain occurs most days of the week for most hours of the day and is described as a mild burning sensation. It gets in the way of her quality of life. She denies fevers, chills, unintentional weight loss.   Review Of Systems Outlined In HPI  Past Medical History  Diagnosis Date  . Fibromyalgia   . Depression   . Allergy   . GERD (gastroesophageal reflux disease)   . Hyperthyroidism 2010    sees Dr Louie Boston, did RAI  . Colitis, ischemic   . Shingles      No family history on file.   History  Substance Use Topics  . Smoking status: Current Every Day Smoker -- 0.50 packs/day for 50 years    Types: Cigarettes  . Smokeless tobacco: Not on file  . Alcohol Use: Not on file     Objective: Filed Vitals:   12/06/12 1440  BP: 126/74  Pulse: 89    Vital signs reviewed. General: Alert and Oriented, No Acute Distress HEENT: Pupils equal, round, reactive to light. Conjunctivae clear.  External ears unremarkable.  Moist mucous membranes. Lungs: Clear and comfortable work of breathing, speaking in full sentences without accessory muscle use. Cardiac: Regular rate and rhythm.  Neuro: CN II-XII grossly intact, gait normal. Extremities: No peripheral edema.  Strong peripheral pulses.  Mental Status: No depression, anxiety, nor agitation. Logical though  process. Skin: Warm and dry. Her back has multiple nevi that do not meet ABCD criteria for suspicion of melanoma. She has scattered seborrheic keratoses but do not appear inflamed. On her right temple she has 2 inflamed seborrheic keratoses, on the left cheek just anterior to the ear she has an additional seborrheic keratoses that is inflamed. All 3 of these are quite tender to the touch.  Assessment & Plan: Heather Perkins was seen today for nevus.  Diagnoses and associated orders for this visit:  Seborrheic keratosis, inflamed    Discussed with the patient that I saw no suspicious findings on her back to be worried about skin cancer. Discussed with patient options to destructive seborrheic keratoses on the face due to the persistent irritation including curettage or cryotherapy. She preferred cryotherapy. Discussed with her that if cryotherapy was unsuccessful she may return in one week for a no charge visit with myself for further therapy.  Return if symptoms worsen or fail to improve.  Cryotherapy Procedure Note  Pre-operative Diagnosis: seborrheic keratosises  Post-operative Diagnosis: same  Locations: Right temple x2, left maxillary ridge  Indications: persistent pain and irritation  Anesthesia: None  Procedure Details    Patient informed of risks (permanent scarring, infection, light or dark discoloration, bleeding, infection, weakness, numbness and recurrence of the lesion) and benefits of the procedure and verbal informed consent obtained.  The areas are treated with liquid nitrogen therapy, frozen until ice ball extended 2  mm beyond lesion, allowed to thaw, and treated again. The patient tolerated procedure well.  The patient was instructed on post-op care, warned that there may be blister formation, redness and pain. Recommend OTC analgesia as needed for pain.  Condition: Stable  Complications: none.  Plan: 1. Instructed to keep the area dry and covered for 24-48h and clean  thereafter. 2. Warning signs of infection were reviewed.   3. Recommended that the patient use OTC analgesics as needed for pain.  4. Return in 1 weeks. PRN

## 2012-12-09 ENCOUNTER — Ambulatory Visit (HOSPITAL_BASED_OUTPATIENT_CLINIC_OR_DEPARTMENT_OTHER): Payer: Medicare HMO

## 2012-12-11 ENCOUNTER — Ambulatory Visit (HOSPITAL_BASED_OUTPATIENT_CLINIC_OR_DEPARTMENT_OTHER)
Admission: RE | Admit: 2012-12-11 | Discharge: 2012-12-11 | Disposition: A | Discharge status: Home / Self Care | Payer: Medicare HMO | Source: Ambulatory Visit | Attending: Family Medicine | Admitting: Family Medicine

## 2012-12-11 ENCOUNTER — Other Ambulatory Visit: Payer: Self-pay | Admitting: *Deleted

## 2012-12-11 ENCOUNTER — Telehealth: Payer: Self-pay | Admitting: *Deleted

## 2012-12-11 DIAGNOSIS — J302 Other seasonal allergic rhinitis: Secondary | ICD-10-CM

## 2012-12-11 DIAGNOSIS — R51 Headache: Secondary | ICD-10-CM | POA: Insufficient documentation

## 2012-12-11 DIAGNOSIS — J329 Chronic sinusitis, unspecified: Secondary | ICD-10-CM | POA: Insufficient documentation

## 2012-12-11 DIAGNOSIS — J3489 Other specified disorders of nose and nasal sinuses: Secondary | ICD-10-CM

## 2012-12-11 DIAGNOSIS — R0982 Postnasal drip: Secondary | ICD-10-CM

## 2012-12-11 MED ORDER — ALPRAZOLAM 0.5 MG PO TABS: 0.5000 mg | ORAL_TABLET | Freq: Four times a day (QID) | ORAL | Status: DC | PRN

## 2012-12-11 MED ORDER — FLUTICASONE PROPIONATE 50 MCG/ACT NA SUSP: 2.0000 | Freq: Every day | NASAL | Status: DC

## 2012-12-11 NOTE — Telephone Encounter (Signed)
flonase sent to pharmacy.Heather Perkins [place]

## 2012-12-12 ENCOUNTER — Other Ambulatory Visit: Payer: Self-pay | Admitting: Family Medicine

## 2013-01-03 ENCOUNTER — Encounter: Payer: Self-pay | Admitting: Sports Medicine

## 2013-01-03 ENCOUNTER — Ambulatory Visit (INDEPENDENT_AMBULATORY_CARE_PROVIDER_SITE_OTHER): Payer: Medicare HMO | Admitting: Sports Medicine

## 2013-01-03 VITALS — BP 101/59 | HR 87 | Wt 123.0 lb

## 2013-01-03 DIAGNOSIS — M25559 Pain in unspecified hip: Secondary | ICD-10-CM

## 2013-01-03 DIAGNOSIS — M25551 Pain in right hip: Secondary | ICD-10-CM

## 2013-01-03 DIAGNOSIS — I739 Peripheral vascular disease, unspecified: Secondary | ICD-10-CM

## 2013-01-03 MED ORDER — ASPIRIN EC 81 MG PO TBEC: 81.0000 mg | DELAYED_RELEASE_TABLET | Freq: Every day | ORAL | Status: DC

## 2013-01-03 NOTE — Assessment & Plan Note (Signed)
Symptoms may represent vascular vs neurogenic claudication vs lumbar radiculitis. At the top of my differential is vascular claudication, she did have a left-sided arterial blockage with sufficient collaterals that she did not need a bypass. Going to start her on a baby aspirin. We are going to obtain ABIs. She'll come back to go over results of the ABIs. If negative we can certainly investigate her lumbar spine as a cause of her symptoms.

## 2013-01-03 NOTE — Assessment & Plan Note (Signed)
Significantly improved after trochanteric bursa injection 5 months ago.

## 2013-01-03 NOTE — Progress Notes (Addendum)
   Subjective:    CC: Leg pain  HPI: This pleasant 79 year old female comes in today and desires to discuss pain she localizes in her right calf when ambulating. The pain comes at the same time at the same distance, and resolves within 30 minutes of rest. Changing position does not worsen or relieve her pain, and walking in a flexed posture does not relieve her pain. She does recall blockage of her left iliac/femoral vasculature without specific intervention in the past. It is localized, doesn't radiate, no numbness or tingling in the lower extremity. She does have mild back pain.  Trochanteric bursitis, right:  injected 5 months ago, still doing very well.  Low back pain: Agrees to discuss this at future visit.  Left ankle pain: Agrees to discuss this at a future visit.  Past medical history, Surgical history, Family history not pertinant except as noted below, Social history, Allergies, and medications have been entered into the medical record, reviewed, and no changes needed.   Review of Systems: No headache, visual changes, nausea, vomiting, diarrhea, constipation, dizziness, abdominal pain, skin rash, fevers, chills, night sweats, weight loss, swollen lymph nodes, body aches, joint swelling, muscle aches, chest pain, shortness of breath, mood changes, visual or auditory hallucinations.   Objective:   General: Well Developed, well nourished, and in no acute distress.  Neuro/Psych: Alert and oriented x3, extra-ocular muscles intact, able to move all 4 extremities, sensation grossly intact. Skin: Warm and dry, no rashes noted.  Respiratory: Not using accessory muscles, speaking in full sentences, trachea midline.  Cardiovascular: Pulses palpable, no extremity edema. Abdomen: Does not appear distended. Back Exam:  Inspection: Unremarkable  Motion: Flexion 45 deg, Extension 45 deg, Side Bending to 45 deg bilaterally,  Rotation to 45 deg bilaterally  SLR laying: Negative  XSLR laying:  Negative  Palpable tenderness: Mild and bilateral at the posterior superior iliac spine. FABER: negative. Sensory change: Gross sensation intact to all lumbar and sacral dermatomes.  Reflexes: 2+ at both patellar tendons, 2+ at achilles tendons, Babinski's downgoing.  Strength at foot  Plantar-flexion: 5/5 Dorsi-flexion: 5/5 Eversion: 5/5 Inversion: 5/5  Leg strength  Quad: 5/5 Hamstring: 5/5 Hip flexor: 5/5 Hip abductors: 5/5  Gait unremarkable. Right Hip: ROM IR: 45 Deg, ER: 45 Deg, Flexion: 120 Deg, Extension: 100 Deg, Abduction: 45 Deg, Adduction: 45 Deg Strength IR: 5/5, ER: 5/5, Flexion: 5/5, Extension: 5/5, Abduction: 5/5, Adduction: 5/5 Pelvic alignment unremarkable to inspection and palpation. Standing hip rotation and gait without trendelenburg sign / unsteadiness. Greater trochanter without tenderness to palpation. No tenderness over piriformis and greater trochanter. No pain with FABER or FADIR. No SI joint tenderness and normal minimal SI movement. Impression and Recommendations:   This case required medical decision making of moderate complexity.

## 2013-01-06 ENCOUNTER — Other Ambulatory Visit: Payer: Self-pay | Admitting: *Deleted

## 2013-01-06 MED ORDER — HYDROCODONE-ACETAMINOPHEN 10-325 MG PO TABS: 1.0000 | ORAL_TABLET | Freq: Four times a day (QID) | ORAL | Status: DC | PRN

## 2013-01-07 ENCOUNTER — Telehealth: Payer: Self-pay | Admitting: *Deleted

## 2013-01-07 DIAGNOSIS — I739 Peripheral vascular disease, unspecified: Secondary | ICD-10-CM

## 2013-01-07 NOTE — Telephone Encounter (Signed)
Had reorder doppler exam

## 2013-01-10 ENCOUNTER — Encounter: Payer: Self-pay | Admitting: Sports Medicine

## 2013-01-10 ENCOUNTER — Ambulatory Visit (INDEPENDENT_AMBULATORY_CARE_PROVIDER_SITE_OTHER): Payer: Medicare HMO | Admitting: Sports Medicine

## 2013-01-10 VITALS — BP 117/75 | HR 87 | Wt 125.0 lb

## 2013-01-10 DIAGNOSIS — I739 Peripheral vascular disease, unspecified: Secondary | ICD-10-CM

## 2013-01-10 MED ORDER — CILOSTAZOL 100 MG PO TABS: 100.0000 mg | ORAL_TABLET | Freq: Two times a day (BID) | ORAL | Status: DC

## 2013-01-10 NOTE — Assessment & Plan Note (Signed)
ABIs show 0.55 on the right side and 0.65 on the left. Symptoms are likely related to popliteal trifurcation disease on the right. I'm going to start Cilostazol and set her up with vascular surgery.

## 2013-01-10 NOTE — Progress Notes (Signed)
   Subjective:    CC: Followup  HPI: Heather Perkins comes back, she saw me previously with cramping in her right calf with walking. She also had some back pain and we were concerned that this was intermittent claudication versus neurogenic claudication. I sent her for ABIs, and she comes back to go over the results. I started her on an aspirin and this improved her symptoms somewhat. She's also taking garlic which is improving her symptoms. She does desire for me to further evaluate her back at a future visit.  Past medical history, Surgical history, Family history not pertinant except as noted below, Social history, Allergies, and medications have been entered into the medical record, reviewed, and no changes needed.   Review of Systems: No headache, visual changes, nausea, vomiting, diarrhea, constipation, dizziness, abdominal pain, skin rash, fevers, chills, night sweats, weight loss, swollen lymph nodes, body aches, joint swelling, muscle aches, chest pain, shortness of breath, mood changes, visual or auditory hallucinations.   Objective:   General: Well Developed, well nourished, and in no acute distress.  Neuro/Psych: Alert and oriented x3, extra-ocular muscles intact, able to move all 4 extremities, sensation grossly intact. Skin: Warm and dry, no rashes noted.  Respiratory: Not using accessory muscles, speaking in full sentences, trachea midline.  Cardiovascular: Pulses palpable, no extremity edema. Abdomen: Does not appear distended.  ABIs were reviewed and show moderate right-sided popliteal arterial insufficiency. A left-sided she has predominately superficial femoral artery arterial insufficiency.  The actual ABI numbers are dictated below.  Impression and Recommendations:   This case required medical decision making of moderate complexity.

## 2013-01-22 ENCOUNTER — Encounter: Payer: Self-pay | Admitting: Family Medicine

## 2013-02-04 ENCOUNTER — Encounter: Payer: Medicare HMO | Admitting: Vascular Surgery

## 2013-02-07 ENCOUNTER — Other Ambulatory Visit: Payer: Self-pay | Admitting: Family Medicine

## 2013-02-07 MED ORDER — HYDROCODONE-ACETAMINOPHEN 10-325 MG PO TABS: 1.0000 | ORAL_TABLET | Freq: Four times a day (QID) | ORAL | Status: DC | PRN

## 2013-02-12 ENCOUNTER — Encounter: Payer: Medicare HMO | Admitting: Vascular Surgery

## 2013-02-13 ENCOUNTER — Encounter: Payer: Medicare HMO | Admitting: Vascular Surgery

## 2013-02-25 ENCOUNTER — Encounter: Payer: Self-pay | Admitting: Vascular Surgery

## 2013-02-26 ENCOUNTER — Encounter (INDEPENDENT_AMBULATORY_CARE_PROVIDER_SITE_OTHER): Payer: Medicare HMO | Admitting: *Deleted

## 2013-02-26 ENCOUNTER — Ambulatory Visit (INDEPENDENT_AMBULATORY_CARE_PROVIDER_SITE_OTHER): Payer: Medicare HMO | Admitting: Vascular Surgery

## 2013-02-26 ENCOUNTER — Encounter: Payer: Self-pay | Admitting: Vascular Surgery

## 2013-02-26 VITALS — BP 116/62 | HR 72 | Ht 62.0 in | Wt 121.5 lb

## 2013-02-26 DIAGNOSIS — I739 Peripheral vascular disease, unspecified: Secondary | ICD-10-CM

## 2013-02-26 DIAGNOSIS — I70219 Atherosclerosis of native arteries of extremities with intermittent claudication, unspecified extremity: Secondary | ICD-10-CM | POA: Insufficient documentation

## 2013-02-26 NOTE — Progress Notes (Signed)
[facility]ist [facility]  Patient name: Heather Perkins MRN: [id number] DOB: [birth year] Sex: female  REASON FOR CONSULT: vascular disease  HPI: Heather Perkins is a 79 y.o. female who states that she has had pain in both lower extremities for many years. Over the last year her symptoms on the right leg and worsens some although recently her symptoms have improved. He describes pain in both calves associated with ambulation and relieved with rest. She also had some right hip pain but this resolved with an injection of her right hip and was likely related to arthritis. He denies any history of rest pain or history of nonhealing ulcers.  She does smoke a third of a pack per day which is down considerably from when she was smoking 1 pack per day. She is unable to take statins because she had problems with joint and muscle pain when on statins.  Past Medical History  Diagnosis Date  . Fibromyalgia   . Depression   . Allergy   . GERD (gastroesophageal reflux disease)   . Hyperthyroidism 2010    sees Dr Louie Boston, did RAI  . Colitis, ischemic   . Shingles   . Peripheral vascular disease   . Cancer     skin   Family History  Problem Relation Age of Onset  . Hypertension Sister   . Cancer Daughter   . Hyperlipidemia Daughter    SOCIAL HISTORY: History  Substance Use Topics  . Smoking status: Current Every Day Smoker -- 0.50 packs/day for 50 years    Types: Cigarettes  . Smokeless tobacco: Never Used     Comment: pt states that she has been trying to quit but can't seem to quit  . Alcohol Use: No   Allergies  Allergen Reactions  . Azithromycin   . Codeine   . Crestor (Rosuvastatin Calcium) Nausea Only  . Elavil (Amitriptyline Hcl)   . Livalo (Pitavastatin Calcium) Other (See Comments)    Headache.   . Naproxen     nausea  . Neosporin (Neomycin-Polymyxin-Gramicidin)   . Wellbutrin (Bupropion) Other (See Comments)    constipation   Current Outpatient Prescriptions   Medication Sig Dispense Refill  . ALPRAZolam (XANAX) 0.5 MG tablet Take 1 tablet (0.5 mg total) by mouth every 6 (six) hours as needed.  100 tablet  1  . aspirin EC 81 MG tablet Take 1 tablet (81 mg total) by mouth daily.  90 tablet  3  . cilostazol (PLETAL) 100 MG tablet Take 1 tablet (100 mg total) by mouth 2 (two) times daily.  60 tablet  3  . ciprofloxacin (CIPRO) 250 MG tablet Take half a tablet once a day to prevent UTI, anticipate stopping May 2014.  30 tablet  3  . cyclobenzaprine (FLEXERIL) 10 MG tablet Take 1 tablet (10 mg total) by mouth 3 (three) times daily as needed.  90 tablet  0  . fish oil-omega-3 fatty acids 1000 MG capsule Take 2 g by mouth daily.      . fluticasone (FLONASE) 50 MCG/ACT nasal spray Place 2 sprays into the nose daily.  16 g  2  . HYDROcodone-acetaminophen (NORCO) 10-325 MG per tablet Take 1 tablet by mouth every 6 (six) hours as needed.  90 tablet  0  . magnesium 30 MG tablet Take 30 mg by mouth 2 (two) times daily.      . phenazopyridine (PYRIDIUM) 200 MG tablet Take 1 tablet (200 mg total) by mouth 3 (three) times daily as needed  for pain.  9 tablet  0  . polyethylene glycol (MIRALAX / GLYCOLAX) packet Take 17 g by mouth daily.        Marland Kitchen propylthiouracil (PTU) 50 MG tablet Take 50 mg by mouth 2 (two) times daily.         No current facility-administered medications for this visit.   REVIEW OF SYSTEMS: Arly.Keller ] denotes positive finding; [  ] denotes negative finding  CARDIOVASCULAR:  [ ]  chest pain   [ ]  chest pressure   [ ]  palpitations   [ ]  orthopnea   [ ]  dyspnea on exertion   Arly.Keller ] claudication   [ ]  rest pain   [ ]  DVT   [ ]  phlebitis PULMONARY:   [ ]  productive cough   [ ]  asthma   [ ]  wheezing NEUROLOGIC:   [ ]  weakness  [ ]  paresthesias  [ ]  aphasia  [ ]  amaurosis  [ ]  dizziness HEMATOLOGIC:   [ ]  bleeding problems   [ ]  clotting disorders MUSCULOSKELETAL:  [ ]  joint pain   [ ]  joint swelling [ ]  leg swelling GASTROINTESTINAL: [ ]   blood in stool  [ ]    hematemesis GENITOURINARY:  Arly.Keller ]  dysuria  [ ]   hematuria PSYCHIATRIC:  Arly.Keller ] history of major depression INTEGUMENTARY:  [ ]  rashes  [ ]  ulcers CONSTITUTIONAL:  [ ]  fever   [ ]  chills  PHYSICAL EXAM: Filed Vitals:   02/26/13 1430  BP: 116/62  Pulse: 72  Height: 5\' 2"  (1.575 m)  Weight: 121 lb 8 oz (55.112 kg)  SpO2: 100%   Body mass index is 22.22 kg/(m^2). GENERAL: The patient is a well-nourished female, in no acute distress. The vital signs are documented above. CARDIOVASCULAR: There is a regular rate and rhythm. Do not detect carotid bruits. She has slightly diminished femoral pulses. She has a palpable right popliteal pulse. I cannot palpate pedal pulses on the right. On the left side, I cannot palpate a popliteal or pedal pulses. PULMONARY: There is good air exchange bilaterally without wheezing or rales. ABDOMEN: Soft and non-tender with normal pitched bowel sounds.  MUSCULOSKELETAL: There are no major deformities or cyanosis. NEUROLOGIC: No focal weakness or paresthesias are detected. SKIN: She has no ischemic ulcers. PSYCHIATRIC: The patient has a normal affect.  DATA:  I have reviewed her records from primary care and sports medicine. She was noted to have claudication at that time. She's had a previous Doppler study which showed an ABI of 85% on the right is 65% on the left.  I have independently interpreted the duplex scan in our office which shows evidence of a popliteal artery occlusion on the right. On the left side she has evidence of a superficial femoral artery occlusion.  MEDICAL ISSUES:  Atherosclerosis of native arteries of the extremities with intermittent claudication Patient has chronic lower extremity arterial occlusive disease with stable claudication of both lower extremities. Based on her exam, she has evidence of tibial artery occlusive disease on the right and superficial femoral artery occlusive disease on the left. Discussed the importance of a  structured walking program. In addition I discussed with her the importance of tobacco cessation. I have discussed with the patient the nature of atherosclerosis, and emphasized the importance of maximal medical management including control of blood pressure, blood glucose, and cholesterol levels, antiplatelet agents, obtaining regular exercise, and cessation of smoking. The patient is aware that without maximal medical management the underlying atherosclerotic disease process will  progress and also limit the benefit of any interventions. Explained that we would typically consider arteriography if she developed rest pain or nonhealing ulcer or her symptoms became disabling. She feels that her symptoms are quite tolerable. I offered to see her on a yearly basis for follow up, however she would prefer to simply call if her symptoms progress.     , S [facility] [place] Beeper: [phone]

## 2013-02-26 NOTE — Assessment & Plan Note (Signed)
Patient has chronic lower extremity arterial occlusive disease with stable claudication of both lower extremities. Based on her exam, she has evidence of tibial artery occlusive disease on the right and superficial femoral artery occlusive disease on the left. Discussed the importance of a structured walking program. In addition I discussed with her the importance of tobacco cessation. I have discussed with the patient the nature of atherosclerosis, and emphasized the importance of maximal medical management including control of blood pressure, blood glucose, and cholesterol levels, antiplatelet agents, obtaining regular exercise, and cessation of smoking. The patient is aware that without maximal medical management the underlying atherosclerotic disease process will progress and also limit the benefit of any interventions. Explained that we would typically consider arteriography if she developed rest pain or nonhealing ulcer or her symptoms became disabling. She feels that her symptoms are quite tolerable. I offered to see her on a yearly basis for follow up, however she would prefer to simply call if her symptoms progress.

## 2013-03-13 ENCOUNTER — Other Ambulatory Visit: Payer: Self-pay | Admitting: *Deleted

## 2013-03-13 MED ORDER — HYDROCODONE-ACETAMINOPHEN 10-325 MG PO TABS: 1.0000 | ORAL_TABLET | Freq: Four times a day (QID) | ORAL | Status: DC | PRN

## 2013-04-15 ENCOUNTER — Other Ambulatory Visit: Payer: Self-pay | Admitting: *Deleted

## 2013-04-15 LAB — TSH: TSH: 1.17 u[IU]/mL (ref 0.41–5.90)

## 2013-04-15 LAB — T4
Free Thyroxine Index: 2.6
T3 Uptake Ratio: 24
T4, Total: 10.9

## 2013-04-15 MED ORDER — HYDROCODONE-ACETAMINOPHEN 10-325 MG PO TABS: 1.0000 | ORAL_TABLET | Freq: Four times a day (QID) | ORAL | Status: DC | PRN

## 2013-05-20 ENCOUNTER — Other Ambulatory Visit: Payer: Self-pay | Admitting: *Deleted

## 2013-05-20 MED ORDER — HYDROCODONE-ACETAMINOPHEN 10-325 MG PO TABS: 1.0000 | ORAL_TABLET | Freq: Four times a day (QID) | ORAL | Status: DC | PRN

## 2013-06-03 ENCOUNTER — Other Ambulatory Visit: Payer: Self-pay | Admitting: *Deleted

## 2013-06-03 MED ORDER — ALPRAZOLAM 0.5 MG PO TABS: 0.5000 mg | ORAL_TABLET | Freq: Four times a day (QID) | ORAL | Status: DC | PRN

## 2013-06-12 ENCOUNTER — Encounter: Payer: Self-pay | Admitting: Family Medicine

## 2013-06-12 ENCOUNTER — Ambulatory Visit (INDEPENDENT_AMBULATORY_CARE_PROVIDER_SITE_OTHER): Payer: Medicare HMO | Admitting: Family Medicine

## 2013-06-12 VITALS — BP 115/70 | HR 84 | Wt 128.0 lb

## 2013-06-12 DIAGNOSIS — F319 Bipolar disorder, unspecified: Secondary | ICD-10-CM

## 2013-06-12 DIAGNOSIS — F17200 Nicotine dependence, unspecified, uncomplicated: Secondary | ICD-10-CM

## 2013-06-12 DIAGNOSIS — I70219 Atherosclerosis of native arteries of extremities with intermittent claudication, unspecified extremity: Secondary | ICD-10-CM

## 2013-06-12 DIAGNOSIS — E0590 Thyrotoxicosis, unspecified without thyrotoxic crisis or storm: Secondary | ICD-10-CM

## 2013-06-12 DIAGNOSIS — Z720 Tobacco use: Secondary | ICD-10-CM | POA: Insufficient documentation

## 2013-06-12 DIAGNOSIS — F411 Generalized anxiety disorder: Secondary | ICD-10-CM

## 2013-06-12 MED ORDER — VARENICLINE TARTRATE 0.5 MG X 11 & 1 MG X 42 PO MISC: ORAL | Status: DC

## 2013-06-12 NOTE — Progress Notes (Signed)
Subjective:    Patient ID: Heather Perkins, female    DOB: [birth year], 79 y.o.   MRN: [id number]  HPI Her to f/u on Mood. Hx of Bipolar.  She uses her Xanax 2-3 times a day. She's not on any other mood control her.  She follows at the [facility] for her hyperthyroidism. He did bring in a copy of her recent lab work with her. Her TSH looked great at 1.4.  Going twice a year know. On PTU.    PAD -Taking pletal. Had to separate from her Zegerid bc as was making you nauseated. She continues to smoke.  Smoking - She has used Chantix in the past and did well. Last time took it qas able to quit for 1.5 years. She did not tolerate Wellbutrin in the past. She has been able to quit for extended periods of time but usually gets very stressed and overwhelmed since started smoking again.  Review of Systems BP 115/70  Pulse 84  Wt 128 lb (58.06 kg)  BMI 23.41 kg/m2    Allergies  Allergen Reactions  . Azithromycin   . Codeine   . Crestor [Rosuvastatin Calcium] Nausea Only  . Elavil [Amitriptyline Hcl]   . Livalo [Pitavastatin Calcium] Other (See Comments)    Headache.   . Naproxen     nausea  . Neosporin [Neomycin-Polymyxin-Gramicidin]   . Thiamazole   . Wellbutrin [Bupropion] Other (See Comments)    constipation    Past Medical History  Diagnosis Date  . Fibromyalgia   . Depression   . Allergy   . GERD (gastroesophageal reflux disease)   . Hyperthyroidism 2010    sees Dr Louie Boston, did RAI  . Colitis, ischemic   . Shingles   . Peripheral vascular disease   . Cancer     skin    Past Surgical History  Procedure Laterality Date  . Appendectomy  1939  . Tonsillectomy    . Breast biopsy  1954  . Abdominal hysterectomy  1975    for dub  . Hemorrhoid surgery  1969  . Orif ankle fracture  1979    left  . Carpal tunnel release  1990    right  . Cataract extraction  2009  . Cervical fusion      History   Social History  . Marital Status: Divorced    Spouse Name: N/A     Number of Children: N/A  . Years of Education: N/A   Occupational History  . Not on file.   Social History Main Topics  . Smoking status: Current Every Day Smoker -- 1.00 packs/day for 50 years    Types: Cigarettes  . Smokeless tobacco: Never Used     Comment: pt states that she has been trying to quit but can't seem to quit  . Alcohol Use: No  . Drug Use: No  . Sexual Activity: Not on file   Other Topics Concern  . Not on file   Social History Narrative  . No narrative on file    Family History  Problem Relation Age of Onset  . Hypertension Sister   . Cancer Daughter   . Hyperlipidemia Daughter     Outpatient Encounter Prescriptions as of 06/12/2013  Medication Sig Dispense Refill  . ALPRAZolam (XANAX) 0.5 MG tablet Take 1 tablet (0.5 mg total) by mouth every 6 (six) hours as needed.  100 tablet  0  . aspirin EC 81 MG tablet Take 1 tablet (81 mg total) by mouth daily.  90 tablet  3  . B Complex Vitamins (B COMPLEX PO) Take by mouth.      . cilostazol (PLETAL) 100 MG tablet Take 1 tablet (100 mg total) by mouth 2 (two) times daily.  60 tablet  3  . cyclobenzaprine (FLEXERIL) 10 MG tablet Take 1 tablet (10 mg total) by mouth 3 (three) times daily as needed.  90 tablet  0  . fish oil-omega-3 fatty acids 1000 MG capsule Take 2 g by mouth daily.      . fluticasone (FLONASE) 50 MCG/ACT nasal spray Place 2 sprays into the nose daily.  16 g  2  . Garlic TABS Take by mouth.      Marland Kitchen HYDROcodone-acetaminophen (NORCO) 10-325 MG per tablet Take 1 tablet by mouth every 6 (six) hours as needed.  90 tablet  0  . magnesium 30 MG tablet Take 30 mg by mouth 2 (two) times daily.      Maxwell Caul Bicarbonate (ZEGERID) 20-1100 MG CAPS capsule Take 1 capsule by mouth daily before breakfast.      . polyethylene glycol (MIRALAX / GLYCOLAX) packet Take 17 g by mouth daily.        Marland Kitchen propylthiouracil (PTU) 50 MG tablet Take 50 mg by mouth 2 (two) times daily.        . varenicline (CHANTIX  STARTING MONTH PAK) 0.5 MG X 11 & 1 MG X 42 tablet Take one 0.5 mg tablet by mouth once daily for 3 days, then increase to one 0.5 mg tablet twice daily for 4 days, then increase to one 1 mg tablet twice daily.  53 tablet  0  . [DISCONTINUED] ciprofloxacin (CIPRO) 250 MG tablet Take half a tablet once a day to prevent UTI, anticipate stopping May 2014.  30 tablet  3  . [DISCONTINUED] phenazopyridine (PYRIDIUM) 200 MG tablet Take 1 tablet (200 mg total) by mouth 3 (three) times daily as needed for pain.  9 tablet  0   No facility-administered encounter medications on file as of 06/12/2013.           Objective:   Physical Exam  Constitutional: She is oriented to person, place, and time. She appears well-developed and well-nourished.  HENT:  Head: Normocephalic and atraumatic.  Cardiovascular: Normal rate, regular rhythm and normal heart sounds.   Pulmonary/Chest: Effort normal and breath sounds normal.  Neurological: She is alert and oriented to person, place, and time.  Skin: Skin is warm and dry.  Psychiatric: She has a normal mood and affect. Her behavior is normal.          Assessment & Plan:  Bipolar/anxiety - her medication was recently refill. I still think she would potentially benefit from being on a controller medication.  Tob abuse - Encouraged cessation.  We discussed the option of Wellbutrin which she has taken the past and did not do well on. She is also not interested in nicotine replacement. I'll be happy to do whatever I can to help her quit smoking. She would like for me to go ahead and send in a prescription for the Chantix to start. She was able to successfully quit for one half years at one point in time. Strongly encouraged her to schedule spirometry sometime this fall so that we can better evaluate her lungs. I suspect with her history that she has COPD. Next  Peripheral arterial disease-continue with blood pressure control and Pletal. She is intolerant to  statins.  Hyperthyroidism-doing well on her PTU. Following twice yearly  with her endocrinologist at the [facility]

## 2013-06-12 NOTE — Patient Instructions (Signed)

## 2013-06-16 ENCOUNTER — Encounter: Payer: Self-pay | Admitting: *Deleted

## 2013-07-16 ENCOUNTER — Other Ambulatory Visit: Payer: Self-pay | Admitting: *Deleted

## 2013-07-16 MED ORDER — ALPRAZOLAM 0.5 MG PO TABS: 0.5000 mg | ORAL_TABLET | Freq: Four times a day (QID) | ORAL | Status: DC | PRN

## 2013-07-17 ENCOUNTER — Telehealth: Payer: Self-pay | Admitting: *Deleted

## 2013-07-17 MED ORDER — HYDROCODONE-ACETAMINOPHEN 10-325 MG PO TABS: 1.0000 | ORAL_TABLET | Freq: Four times a day (QID) | ORAL | Status: DC | PRN

## 2013-07-17 NOTE — Telephone Encounter (Signed)
Refill for vicodin pt will come by and p/u tomorrow.Heather Perkins [place]

## 2013-08-12 ENCOUNTER — Ambulatory Visit (INDEPENDENT_AMBULATORY_CARE_PROVIDER_SITE_OTHER): Payer: Medicare HMO | Admitting: Family Medicine

## 2013-08-12 ENCOUNTER — Encounter: Payer: Self-pay | Admitting: Family Medicine

## 2013-08-12 VITALS — BP 112/63 | HR 95 | Wt 125.0 lb

## 2013-08-12 DIAGNOSIS — F17200 Nicotine dependence, unspecified, uncomplicated: Secondary | ICD-10-CM

## 2013-08-12 DIAGNOSIS — Z720 Tobacco use: Secondary | ICD-10-CM

## 2013-08-12 DIAGNOSIS — J449 Chronic obstructive pulmonary disease, unspecified: Secondary | ICD-10-CM

## 2013-08-12 LAB — PULMONARY FUNCTION TEST

## 2013-08-12 MED ORDER — HYDROCODONE-ACETAMINOPHEN 10-325 MG PO TABS: 1.0000 | ORAL_TABLET | Freq: Four times a day (QID) | ORAL | Status: DC | PRN

## 2013-08-12 MED ORDER — ALBUTEROL SULFATE HFA 108 (90 BASE) MCG/ACT IN AERS: 2.0000 | INHALATION_SPRAY | Freq: Four times a day (QID) | RESPIRATORY_TRACT | Status: DC | PRN

## 2013-08-12 MED ORDER — ALBUTEROL SULFATE (2.5 MG/3ML) 0.083% IN NEBU
2.5000 mg | INHALATION_SOLUTION | Freq: Once | RESPIRATORY_TRACT | Status: AC
  Administered 2013-08-12: 2.5 mg via RESPIRATORY_TRACT

## 2013-08-12 NOTE — Progress Notes (Signed)
  Subjective:    Patient ID: Heather Perkins, female    DOB: Dec 01, 1933, 79 y.o.   MRN: [id number]  HPI Hears for spirometry. 50-pack-year smoker. She currently smokes a half a pack to one pack per day. She has quit before cold [place] for up to 1-2 years that time. The last time she try to quit [place] she says she couldn't do it and try to wean instead. She did call 1 800 quit now program but says she couldn't get to speak to anyone that she can actually understand their speech. She is not interested in nicotine replacement. She's not interested in E. cigarettes. She's not imaged the nicotine patches she was unable to afford the Chantix which was $95 a month. Her daughter was with her here today for her appointment.Daughter smokes as well.   Review of Systems     Objective:   Physical Exam  Constitutional: She is oriented to person, place, and time. She appears well-developed and well-nourished.  HENT:  Head: Normocephalic and atraumatic.  Cardiovascular: Normal rate, regular rhythm and normal heart sounds.   Pulmonary/Chest: Effort normal and breath sounds normal. She has no wheezes.  Prolonged expiratory phase  Neurological: She is alert and oriented to person, place, and time.  Skin: Skin is warm and dry.  Psychiatric: She has a normal mood and affect. Her behavior is normal.          Assessment & Plan:  COPD, mild to moderate based on spirometry today. Her FEV1 ratio is 64%. Her FVC was 124% FEV1 with 105%, which based on the curve doesn't look exactly accurate. I would like to repeat spirometry in one month. Most important encourage smoking cessation. Also recommend having a rescue inhaler on hand. rx sent to pharmacy.  Also recommend flu vaccination. She declined today.  Tob Abuse - discussed strategies to quit.  After discussion today was very clear to me that patient knows she needs to quit but is not quite ready to quit.

## 2013-08-13 ENCOUNTER — Encounter: Payer: Self-pay | Admitting: Family Medicine

## 2013-08-25 ENCOUNTER — Other Ambulatory Visit: Payer: Self-pay | Admitting: *Deleted

## 2013-08-25 MED ORDER — ALPRAZOLAM 0.5 MG PO TABS
0.5000 mg | ORAL_TABLET | Freq: Four times a day (QID) | ORAL | Status: DC | PRN
Start: 2013-08-25 — End: 2013-12-09

## 2013-10-27 ENCOUNTER — Other Ambulatory Visit: Payer: Self-pay | Admitting: *Deleted

## 2013-10-27 MED ORDER — HYDROCODONE-ACETAMINOPHEN 10-325 MG PO TABS: 1.0000 | ORAL_TABLET | Freq: Four times a day (QID) | ORAL | Status: DC | PRN

## 2013-12-08 ENCOUNTER — Other Ambulatory Visit: Payer: Self-pay

## 2013-12-08 MED ORDER — HYDROCODONE-ACETAMINOPHEN 10-325 MG PO TABS: 1.0000 | ORAL_TABLET | Freq: Four times a day (QID) | ORAL | Status: DC | PRN

## 2013-12-09 ENCOUNTER — Other Ambulatory Visit: Payer: Self-pay | Admitting: *Deleted

## 2013-12-09 MED ORDER — ALPRAZOLAM 0.5 MG PO TABS: 0.5000 mg | ORAL_TABLET | Freq: Four times a day (QID) | ORAL | Status: DC | PRN

## 2014-01-23 ENCOUNTER — Encounter: Payer: Self-pay | Admitting: Family Medicine

## 2014-01-23 ENCOUNTER — Ambulatory Visit (INDEPENDENT_AMBULATORY_CARE_PROVIDER_SITE_OTHER): Payer: Medicare HMO | Admitting: Family Medicine

## 2014-01-23 VITALS — BP 122/78 | HR 90 | Wt 126.0 lb

## 2014-01-23 DIAGNOSIS — H9190 Unspecified hearing loss, unspecified ear: Secondary | ICD-10-CM | POA: Insufficient documentation

## 2014-01-23 DIAGNOSIS — I10 Essential (primary) hypertension: Secondary | ICD-10-CM

## 2014-01-23 DIAGNOSIS — R7309 Other abnormal glucose: Secondary | ICD-10-CM

## 2014-01-23 DIAGNOSIS — Z0000 Encounter for general adult medical examination without abnormal findings: Secondary | ICD-10-CM

## 2014-01-23 MED ORDER — HYDROCODONE-ACETAMINOPHEN 10-325 MG PO TABS: 1.0000 | ORAL_TABLET | Freq: Four times a day (QID) | ORAL | Status: DC | PRN

## 2014-01-23 MED ORDER — ALBUTEROL SULFATE HFA 108 (90 BASE) MCG/ACT IN AERS: 2.0000 | INHALATION_SPRAY | Freq: Four times a day (QID) | RESPIRATORY_TRACT | Status: DC | PRN

## 2014-01-23 MED ORDER — FLUTICASONE PROPIONATE 50 MCG/ACT NA SUSP: 2.0000 | Freq: Every day | NASAL | Status: DC

## 2014-01-23 NOTE — Progress Notes (Addendum)
Subjective:    Heather Perkins is a 80 y.o. female who presents for Medicare Annual/Subsequent preventive examination.  Preventive Screening-Counseling & Management  Tobacco History  Smoking status  . Current Every Day Smoker -- 1.00 packs/day for 50 years  . Types: Cigarettes  Smokeless tobacco  . Never Used    Comment: pt states that she has been trying to quit but can't seem to quit     Problems Prior to Visit 1. Sill having a lot of joint pain nad needs refill on pain medications 2. Feels very depressed. She is seeing a psychologist is not interested in any type of medication. She has decided to followup with her psychologist a little bit sooner rather than later because of her mood. No thoughts of harming herself. She just feels sad and down.  Current Problems (verified) Patient Active Problem List   Diagnosis Date Noted  . COPD (chronic obstructive pulmonary disease) 08/12/2013  . Tobacco abuse 06/12/2013  . Atherosclerosis of native arteries of the extremities with intermittent claudication 02/26/2013  . Claudication of right lower extremity 01/03/2013  . Recurrent UTI 08/08/2012  . Right hip pain 08/08/2012  . GERD (gastroesophageal reflux disease) 06/16/2011  . Hypercholesteremia 04/16/2011  . Hyperthyroidism 03/27/2011  . DDD (degenerative disc disease), cervical 03/27/2011  . Bipolar disorder 03/03/2011  . Brachial neuritis 10/25/2010  . Myalgia and myositis 10/25/2010  . Peptic ulcer 07/14/2009  . Anxiety state 12/03/2007  . Dysthymic disorder 11/04/2007  . Malignant neoplasm of skin 05/24/2007  . Rosacea 05/24/2007  . Glaucoma 05/24/2007  . Herpes zoster 05/13/2007    Medications Prior to Visit Current Outpatient Prescriptions on File Prior to Visit  Medication Sig Dispense Refill  . albuterol (PROVENTIL HFA;VENTOLIN HFA) 108 (90 BASE) MCG/ACT inhaler Inhale 2 puffs into the lungs every 6 (six) hours as needed for wheezing or shortness of breath.  1 Inhaler   0  . ALPRAZolam (XANAX) 0.5 MG tablet Take 1 tablet (0.5 mg total) by mouth every 6 (six) hours as needed.  100 tablet  1  . aspirin EC 81 MG tablet Take 1 tablet (81 mg total) by mouth daily.  90 tablet  3  . B Complex Vitamins (B COMPLEX PO) Take by mouth.      . cilostazol (PLETAL) 100 MG tablet Take 1 tablet (100 mg total) by mouth 2 (two) times daily.  60 tablet  3  . cyclobenzaprine (FLEXERIL) 10 MG tablet Take 1 tablet (10 mg total) by mouth 3 (three) times daily as needed.  90 tablet  0  . fish oil-omega-3 fatty acids 1000 MG capsule Take 2 g by mouth daily.      . fluticasone (FLONASE) 50 MCG/ACT nasal spray Place 2 sprays into the nose daily.  16 g  2  . Garlic TABS Take by mouth.      Marland Kitchen HYDROcodone-acetaminophen (NORCO) 10-325 MG per tablet Take 1 tablet by mouth every 6 (six) hours as needed.  90 tablet  0  . magnesium 30 MG tablet Take 30 mg by mouth 2 (two) times daily.      Earney Navy Bicarbonate (ZEGERID) 20-1100 MG CAPS capsule Take 1 capsule by mouth daily before breakfast.      . polyethylene glycol (MIRALAX / GLYCOLAX) packet Take 17 g by mouth daily.        Marland Kitchen propylthiouracil (PTU) 50 MG tablet Take 50 mg by mouth 2 (two) times daily.         No current facility-administered medications  on file prior to visit.    Current Medications (verified) Current Outpatient Prescriptions  Medication Sig Dispense Refill  . albuterol (PROVENTIL HFA;VENTOLIN HFA) 108 (90 BASE) MCG/ACT inhaler Inhale 2 puffs into the lungs every 6 (six) hours as needed for wheezing or shortness of breath.  1 Inhaler  0  . ALPRAZolam (XANAX) 0.5 MG tablet Take 1 tablet (0.5 mg total) by mouth every 6 (six) hours as needed.  100 tablet  1  . aspirin EC 81 MG tablet Take 1 tablet (81 mg total) by mouth daily.  90 tablet  3  . B Complex Vitamins (B COMPLEX PO) Take by mouth.      . cilostazol (PLETAL) 100 MG tablet Take 1 tablet (100 mg total) by mouth 2 (two) times daily.  60 tablet  3  .  cyclobenzaprine (FLEXERIL) 10 MG tablet Take 1 tablet (10 mg total) by mouth 3 (three) times daily as needed.  90 tablet  0  . fish oil-omega-3 fatty acids 1000 MG capsule Take 2 g by mouth daily.      . fluticasone (FLONASE) 50 MCG/ACT nasal spray Place 2 sprays into the nose daily.  16 g  2  . Garlic TABS Take by mouth.      Marland Kitchen HYDROcodone-acetaminophen (NORCO) 10-325 MG per tablet Take 1 tablet by mouth every 6 (six) hours as needed.  90 tablet  0  . magnesium 30 MG tablet Take 30 mg by mouth 2 (two) times daily.      Earney Navy Bicarbonate (ZEGERID) 20-1100 MG CAPS capsule Take 1 capsule by mouth daily before breakfast.      . polyethylene glycol (MIRALAX / GLYCOLAX) packet Take 17 g by mouth daily.        Marland Kitchen propylthiouracil (PTU) 50 MG tablet Take 50 mg by mouth 2 (two) times daily.         No current facility-administered medications for this visit.     Allergies (verified) Azithromycin; Codeine; Crestor; Elavil; Livalo; Naproxen; Neosporin; Thiamazole; and Wellbutrin   PAST HISTORY  Family History Family History  Problem Relation Age of Onset  . Hypertension Sister   . Cancer Daughter   . Hyperlipidemia Daughter     Social History History  Substance Use Topics  . Smoking status: Current Every Day Smoker -- 1.00 packs/day for 50 years    Types: Cigarettes  . Smokeless tobacco: Never Used     Comment: pt states that she has been trying to quit but can't seem to quit  . Alcohol Use: No     Are there smokers in your home (other than you)? No  Risk Factors Current exercise habits: The patient does not participate in regular exercise at present.  Dietary issues discussed: Feels like she stays hungry all the time.  Cardiac risk factors: advanced age (older than 47 for men, 63 for women), dyslipidemia, hypertension, sedentary lifestyle and smoking/ tobacco exposure.  Depression Screen (Note: if answer to either of the following is "Yes", a more complete depression  screening is indicated)   Over the past two weeks, have you felt down, depressed or hopeless? Yes  Over the past two weeks, have you felt little interest or pleasure in doing things? Yes  Have you lost interest or pleasure in daily life? No  Do you often feel hopeless? Yes  Do you cry easily over simple problems? Yes  Activities of Daily Living In your present state of health, do you have any difficulty performing the following activities?:  Driving? No Managing money?  No Feeding yourself? No Getting from bed to chair? Yes  Climbing a flight of stairs? Yes  Preparing food and eating?: No Bathing or showering? No Getting dressed: No Getting to the toilet? No Using the toilet:No Moving around from place to place: No In the past year have you fallen or had a near fall?:No   Are you sexually active?  No  Do you have more than one partner?  No  Hearing Difficulties: Yes, she has hearing aids but feels they don't work well.  Do you often ask people to speak up or repeat themselves? Yes Do you experience ringing or noises in your ears? Yes Do you have difficulty understanding soft or whispered voices? Yes   Do you feel that you have a problem with memory? Yes  Do you often misplace items? No  Do you feel safe at home?  Yes  Cognitive Testing  Alert? Yes  Normal Appearance?Yes  Oriented to person? Yes  Place? Yes   Time? Yes  Recall of three objects?  Yes  Can perform simple calculations? Yes  Displays appropriate judgment?Yes  Can read the correct time from a watch face?Yes   Advanced Directives have been discussed with the patient? Yes  List the Names of Other Physician/Practitioners you currently use: 1.  Dr. Tawnya Crook, [facility] for her thyroid.   Indicate any recent Medical Services you may have received from other than [facility] providers in the past year (date may be approximate).  Immunization History  Administered Date(s) Administered  . Tdap 03/27/2012     Screening Tests Health Maintenance  Topic Date Due  . Colonoscopy  11/17/1985  . Zostavax  03/27/2014 (Originally 11/18/1995)  . Influenza Vaccine  05/09/2014  . Tetanus/tdap  03/27/2022  . Pneumococcal Polysaccharide Vaccine Age 3 And Over  Addressed    All answers were reviewed with the patient and necessary referrals were made:  ,, MD   01/23/2014   History reviewed: allergies, current medications, past family history, past medical history, past social history, past surgical history and problem list  Review of Systems A comprehensive review of systems was negative.    Objective:     Vision by Snellen chart:will call to get visoin report   There is no weight on file to calculate BMI. There were no vitals taken for this visit.  BP 122/78  Pulse 90  Wt 126 lb (57.153 kg)  General Appearance:    Alert, cooperative, no distress, appears stated age  Head:    Normocephalic, without obvious abnormality, atraumatic  Eyes:    PERRL, conjunctiva/corneas clear, EOM's intact, both eyes  Ears:    Normal TM's and external ear canals, both ears  Nose:   Nares normal, septum midline, mucosa normal, no drainage    or sinus tenderness  Throat:   Lips, mucosa, and tongue normal; teeth and gums normal  Neck:   Supple, symmetrical, trachea midline, no adenopathy;    thyroid:  no enlargement/tenderness/nodules; no carotid   bruit or JVD  Back:     Symmetric, no curvature, ROM normal, no CVA tenderness  Lungs:     Clear to auscultation bilaterally, respirations unlabored  Chest Wall:    No tenderness or deformity   Heart:    Regular rate and rhythm, S1 and S2 normal, no murmur, rub   or gallop  Breast Exam:    Not performed  Abdomen:     Soft, non-tender, bowel sounds active all four quadrants,  no masses, no organomegaly  Genitalia:    Not performed.   Rectal:    Not performed.   Extremities:   Extremities normal, atraumatic, no cyanosis or edema  Pulses:   2+  and symmetric all extremities, just able to palpate a trace dorsal pedal pulse on the right foot. Unable to palpate dorsal pedal pulse on the left foot. Capillary refill on the toes is approximately 3 seconds.   Skin:   Skin color, texture, turgor normal, no rashes or lesions  Lymph nodes:   Cervical, supraclavicular, and axillary nodes normal  Neurologic:   CNII-XII intact, normal strength, sensation and reflexes    throughout       Assessment:     Medicare Annual Wellness Exam      Plan:     During the course of the visit the patient was educated and counseled about appropriate screening and preventive services including:    Bone densitometry screening  Colorectal cancer screening  Smoking cessation counseling - encouraged cessation.   Depression - she is clearly depressed. She is happy working with her psychiatrist and is not interested in taking any prescription medications at this point time.  She's had a hysterectomy. No Pap smear needed.  Abnormal thyroid-we'll call the [facility] for records.  Due for CMP and lipids and A1C.   Declined screening colonoscopy.  Diet review for nutrition referral? Yes ____  Not Indicated _X_   Patient Instructions (the written plan) was given to the patient.  Medicare Attestation I have personally reviewed: The patient's medical and social history Their use of alcohol, tobacco or illicit drugs Their current medications and supplements The patient's functional ability including ADLs,fall risks, home safety risks, cognitive, and hearing and visual impairment Diet and physical activities Evidence for depression or mood disorders  The patient's weight, height, BMI, and visual acuity have been recorded in the chart.  I have made referrals, counseling, and provided education to the patient based on review of the above and I have provided the patient with a written personalized care plan for preventive services.      ,, MD   01/23/2014

## 2014-01-23 NOTE — Progress Notes (Signed)
Called the [facility] [phone] spoke w/Kika and she told me that I will need to fax the request to [phone] requesting the information that I need.Heather Perkins

## 2014-01-23 NOTE — Patient Instructions (Signed)
Smoking Cessation Quitting smoking is important to your health and has many advantages. However, it is not always easy to quit since nicotine is a very addictive drug. Often times, people try 3 times or more before being able to quit. This document explains the best ways for you to prepare to quit smoking. Quitting takes hard work and a lot of effort, but you can do it. ADVANTAGES OF QUITTING SMOKING  You will live longer, feel better, and live better.  Your body will feel the impact of quitting smoking almost immediately.  Within 20 minutes, blood pressure decreases. Your pulse returns to its normal level.  After 8 hours, carbon monoxide levels in the blood return to normal. Your oxygen level increases.  After 24 hours, the chance of having a heart attack starts to decrease. Your breath, hair, and body stop smelling like smoke.  After 48 hours, damaged nerve endings begin to recover. Your sense of taste and smell improve.  After 72 hours, the body is virtually free of nicotine. Your bronchial tubes relax and breathing becomes easier.  After 2 to 12 weeks, lungs can hold more air. Exercise becomes easier and circulation improves.  The risk of having a heart attack, stroke, cancer, or lung disease is greatly reduced.  After 1 year, the risk of coronary heart disease is cut in half.  After 5 years, the risk of stroke falls to the same as a nonsmoker.  After 10 years, the risk of lung cancer is cut in half and the risk of other cancers decreases significantly.  After 15 years, the risk of coronary heart disease drops, usually to the level of a nonsmoker.  If you are pregnant, quitting smoking will improve your chances of having a healthy baby.  The people you live with, especially any children, will be healthier.  You will have extra money to spend on things other than cigarettes. QUESTIONS TO THINK ABOUT BEFORE ATTEMPTING TO QUIT You may want to talk about your answers with your  caregiver.  Why do you want to quit?  If you tried to quit in the past, what helped and what did not?  What will be the most difficult situations for you after you quit? How will you plan to handle them?  Who can help you through the tough times? Your family? Friends? A caregiver?  What pleasures do you get from smoking? What ways can you still get pleasure if you quit? Here are some questions to ask your caregiver:  How can you help me to be successful at quitting?  What medicine do you think would be best for me and how should I take it?  What should I do if I need more help?  What is smoking withdrawal like? How can I get information on withdrawal? GET READY  Set a quit date.  Change your environment by getting rid of all cigarettes, ashtrays, matches, and lighters in your home, car, or work. Do not let people smoke in your home.  Review your past attempts to quit. Think about what worked and what did not. GET SUPPORT AND ENCOURAGEMENT You have a better chance of being successful if you have help. You can get support in many ways.  Tell your family, friends, and co-workers that you are going to quit and need their support. Ask them not to smoke around you.  Get individual, group, or telephone counseling and support. Programs are available at local hospitals and health centers. Call your local health department for   information about programs in your area.  Spiritual beliefs and practices may help some smokers quit.  Download a "quit meter" on your computer to keep track of quit statistics, such as how long you have gone without smoking, cigarettes not smoked, and money saved.  Get a self-help book about quitting smoking and staying off of tobacco. LEARN NEW SKILLS AND BEHAVIORS  Distract yourself from urges to smoke. Talk to someone, go for a walk, or occupy your time with a task.  Change your normal routine. Take a different route to work. Drink tea instead of coffee.  Eat breakfast in a different place.  Reduce your stress. Take a hot bath, exercise, or read a book.  Plan something enjoyable to do every day. Reward yourself for not smoking.  Explore interactive web-based programs that specialize in helping you quit. GET MEDICINE AND USE IT CORRECTLY Medicines can help you stop smoking and decrease the urge to smoke. Combining medicine with the above behavioral methods and support can greatly increase your chances of successfully quitting smoking.  Nicotine replacement therapy helps deliver nicotine to your body without the negative effects and risks of smoking. Nicotine replacement therapy includes nicotine gum, lozenges, inhalers, nasal sprays, and skin patches. Some may be available over-the-counter and others require a prescription.  Antidepressant medicine helps people abstain from smoking, but how this works is unknown. This medicine is available by prescription.  Nicotinic receptor partial agonist medicine simulates the effect of nicotine in your brain. This medicine is available by prescription. Ask your caregiver for advice about which medicines to use and how to use them based on your health history. Your caregiver will tell you what side effects to look out for if you choose to be on a medicine or therapy. Carefully read the information on the package. Do not use any other product containing nicotine while using a nicotine replacement product.  RELAPSE OR DIFFICULT SITUATIONS Most relapses occur within the first 3 months after quitting. Do not be discouraged if you start smoking again. Remember, most people try several times before finally quitting. You may have symptoms of withdrawal because your body is used to nicotine. You may crave cigarettes, be irritable, feel very hungry, cough often, get headaches, or have difficulty concentrating. The withdrawal symptoms are only temporary. They are strongest when you first quit, but they will go away within  10 14 days. To reduce the chances of relapse, try to:  Avoid drinking alcohol. Drinking lowers your chances of successfully quitting.  Reduce the amount of caffeine you consume. Once you quit smoking, the amount of caffeine in your body increases and can give you symptoms, such as a rapid heartbeat, sweating, and anxiety.  Avoid smokers because they can make you want to smoke.  Do not let weight gain distract you. Many smokers will gain weight when they quit, usually less than 10 pounds. Eat a healthy diet and stay active. You can always lose the weight gained after you quit.  Find ways to improve your mood other than smoking. FOR MORE INFORMATION  www.smokefree.gov  Document Released: 09/19/2001 Document Revised: 03/26/2012 Document Reviewed: 01/04/2012 [employer] Patient Information 2014 [employer].  

## 2014-01-26 ENCOUNTER — Other Ambulatory Visit: Payer: Self-pay | Admitting: Family Medicine

## 2014-01-26 DIAGNOSIS — Z1231 Encounter for screening mammogram for malignant neoplasm of breast: Secondary | ICD-10-CM

## 2014-01-26 LAB — COMPLETE METABOLIC PANEL WITH GFR
ALT: 13 U/L (ref 0–35)
AST: 19 U/L (ref 0–37)
Albumin: 4.7 g/dL (ref 3.5–5.2)
Alkaline Phosphatase: 70 U/L (ref 39–117)
BUN: 11 mg/dL (ref 6–23)
CO2: 27 mEq/L (ref 19–32)
Calcium: 9.9 mg/dL (ref 8.4–10.5)
Chloride: 102 mEq/L (ref 96–112)
Creat: 0.9 mg/dL (ref 0.50–1.10)
GFR, Est African American: 71 mL/min
GFR, Est Non African American: 61 mL/min
Glucose, Bld: 93 mg/dL (ref 70–99)
Potassium: 4.2 mEq/L (ref 3.5–5.3)
Sodium: 137 mEq/L (ref 135–145)
Total Bilirubin: 0.7 mg/dL (ref 0.2–1.2)
Total Protein: 7.3 g/dL (ref 6.0–8.3)

## 2014-01-26 LAB — HEMOGLOBIN A1C
Hgb A1c MFr Bld: 5.4 % (ref <5.7)
Mean Plasma Glucose: 108 mg/dL (ref <117)

## 2014-01-26 LAB — LIPID PANEL
Cholesterol: 327 mg/dL — ABNORMAL HIGH (ref 0–200)
HDL: 50 mg/dL (ref >39)
LDL Cholesterol: 244 mg/dL — ABNORMAL HIGH (ref 0–99)
Total CHOL/HDL Ratio: 6.5 Ratio
Triglycerides: 163 mg/dL — ABNORMAL HIGH (ref <150)
VLDL: 33 mg/dL (ref 0–40)

## 2014-01-27 ENCOUNTER — Other Ambulatory Visit: Payer: Self-pay | Admitting: *Deleted

## 2014-01-27 DIAGNOSIS — E785 Hyperlipidemia, unspecified: Secondary | ICD-10-CM

## 2014-01-29 ENCOUNTER — Ambulatory Visit (INDEPENDENT_AMBULATORY_CARE_PROVIDER_SITE_OTHER): Payer: Medicare HMO

## 2014-01-29 ENCOUNTER — Ambulatory Visit: Payer: Medicare HMO

## 2014-01-29 DIAGNOSIS — Z1231 Encounter for screening mammogram for malignant neoplasm of breast: Secondary | ICD-10-CM

## 2014-02-12 ENCOUNTER — Telehealth: Payer: Self-pay | Admitting: Family Medicine

## 2014-02-12 NOTE — Telephone Encounter (Signed)
Ok with me 

## 2014-02-12 NOTE — Telephone Encounter (Signed)
Patient called and request to change providers from Dr. Murriel Hopper to Dr. Ileene Rubens. Patient states she feels Dr.Hommel understands her better. Patient is scheduled for 02/13/14 at 1:45pm with Dr. Ileene Rubens. Thanks

## 2014-02-13 ENCOUNTER — Encounter: Payer: Self-pay | Admitting: Family Medicine

## 2014-02-13 ENCOUNTER — Ambulatory Visit (INDEPENDENT_AMBULATORY_CARE_PROVIDER_SITE_OTHER): Payer: Medicare HMO | Admitting: Family Medicine

## 2014-02-13 VITALS — BP 103/63 | HR 79 | Wt 126.0 lb

## 2014-02-13 DIAGNOSIS — J449 Chronic obstructive pulmonary disease, unspecified: Secondary | ICD-10-CM

## 2014-02-13 DIAGNOSIS — I739 Peripheral vascular disease, unspecified: Secondary | ICD-10-CM

## 2014-02-13 DIAGNOSIS — M62830 Muscle spasm of back: Secondary | ICD-10-CM

## 2014-02-13 DIAGNOSIS — M5380 Other specified dorsopathies, site unspecified: Secondary | ICD-10-CM

## 2014-02-13 MED ORDER — CILOSTAZOL 100 MG PO TABS: 100.0000 mg | ORAL_TABLET | Freq: Two times a day (BID) | ORAL | Status: DC

## 2014-02-13 NOTE — Progress Notes (Signed)
CC: Heather Perkins is a 80 y.o. female is here for discuss concerns   Subjective: HPI:  Patient complains of right posterior leg pain that begins minutes after walking or if her feet are elevated above the level of her heart. Pain has been persistent and on a daily basis rated at mild to moderate in severity. Has been present for the past one to 2 months. Other than above nothing else particular makes it better or worse. She denies swelling, redness, nor overlying skin changes anywhere on the leg.  Patient complains of mild wheezing that has been present for the past week. Worse with exertion nothing particularly makes it better or worse other than that. She has tried albuterol but doesn't seem to do much difference. Denies fevers, chills, cough, chest pain, confusion  Complains of back pain localized between both shoulder blades however not in the midline of the thoracic spine. Has been present for years nothing particularly makes it better or worse other than improving with activity and stretching. Worse first thing in the morning and described as a tightness and stiffness.  Denies radiation of pain.   Review Of Systems Outlined In HPI  Past Medical History  Diagnosis Date  . Fibromyalgia   . Depression   . Allergy   . GERD (gastroesophageal reflux disease)   . Hyperthyroidism 2010    sees Dr Marcello Moores, did RAI  . Colitis, ischemic   . Shingles   . Peripheral vascular disease   . Cancer     skin    Past Surgical History  Procedure Laterality Date  . Appendectomy  1939  . Tonsillectomy    . Breast biopsy  1954  . Abdominal hysterectomy  1975    for dub  . Hemorrhoid surgery  1969  . Orif ankle fracture  1979    left  . Carpal tunnel release  1990    right  . Cataract extraction  2009    right and left.   . Cervical fusion     Family History  Problem Relation Age of Onset  . Hypertension Sister   . Cancer Daughter   . Hyperlipidemia Daughter     History   Social  History  . Marital Status: Divorced    Spouse Name: N/A    Number of Children: N/A  . Years of Education: N/A   Occupational History  . Not on file.   Social History Main Topics  . Smoking status: Current Every Day Smoker -- 1.00 packs/day for 50 years    Types: Cigarettes  . Smokeless tobacco: Never Used     Comment: pt states that she has been trying to quit but can't seem to quit  . Alcohol Use: No  . Drug Use: No  . Sexual Activity: Not on file   Other Topics Concern  . Not on file   Social History Narrative  . No narrative on file     Objective: BP 103/63  Pulse 79  Wt 126 lb (57.153 kg)  General: Alert and Oriented, No Acute Distress HEENT: Pupils equal, round, reactive to light. Conjunctivae clear.  Moist membranes pharynx unremarkable Lungs: Clear to auscultation bilaterally, no wheezing/ronchi/rales.  Comfortable work of breathing. Good air movement. Cardiac: Regular rate and rhythm. Normal S1/S2.  No murmurs, rubs, nor gallops.   Back: No midline spinous process tenderness in the thoracic region, pain is reproduced with palpation of paraspinal musculature between the scapula. Extremities: No peripheral edema.  Trace lower externally peripheral pulses Mental  Status: No depression, anxiety, nor agitation. Skin: Warm and dry.  Assessment & Plan: An was seen today for discuss concerns.  Diagnoses and associated orders for this visit:  Claudication of right lower extremity - cilostazol (PLETAL) 100 MG tablet; Take 1 tablet (100 mg total) by mouth 2 (two) times daily.  Back spasm  COPD (chronic obstructive pulmonary disease)    Claudication of the right lower extremity in a patient with known peripheral artery disease. Start Pletal, return before 3 months if not improving. Encouraged to walk as much as possible to help promote capillary growth. Back spasm: Restart former dose of cyclobenzaprine only at bedtime in hopes that this will alleviate am  symptoms COPD: Uncontrolled, when she demonstrated to me how to use her albuterol inhaler it appears that she actually does not know how to use this. She was educated on how to use it appropriately and after one observed dose felt better, encouraged to use as needed   Return in about 3 months (around 05/16/2014).

## 2014-02-13 NOTE — Telephone Encounter (Signed)
I don't see how this is possible but ok.

## 2014-02-25 ENCOUNTER — Encounter: Payer: Self-pay | Admitting: Family Medicine

## 2014-02-25 ENCOUNTER — Ambulatory Visit (INDEPENDENT_AMBULATORY_CARE_PROVIDER_SITE_OTHER): Payer: Medicare HMO | Admitting: Family Medicine

## 2014-02-25 VITALS — BP 152/79 | HR 85 | Wt 126.0 lb

## 2014-02-25 DIAGNOSIS — R109 Unspecified abdominal pain: Secondary | ICD-10-CM

## 2014-02-25 DIAGNOSIS — R102 Pelvic and perineal pain: Secondary | ICD-10-CM

## 2014-02-25 DIAGNOSIS — R079 Chest pain, unspecified: Secondary | ICD-10-CM

## 2014-02-25 DIAGNOSIS — A059 Bacterial foodborne intoxication, unspecified: Secondary | ICD-10-CM

## 2014-02-25 LAB — POCT URINALYSIS DIPSTICK
Bilirubin, UA: NEGATIVE
Glucose, UA: NEGATIVE
Ketones, UA: NEGATIVE
Leukocytes, UA: NEGATIVE
Nitrite, UA: NEGATIVE
Protein, UA: NEGATIVE
Spec Grav, UA: <=1.005
Urobilinogen, UA: 0.2
pH, UA: 7

## 2014-02-25 NOTE — Progress Notes (Addendum)
CC: Heather Perkins is a 80 y.o. female is here for Chest Pain   Subjective: HPI:  Complains of acute onset nausea, vomiting, abdominal discomfort that began early Sunday morning described as moderate to severe in severity. Vomited she predicts to be 5-6 times. She believes it was nonbloody without coffee ground appearance.  Abdominal pain was described only as pain in nonradiating. Symptoms slowly improved and Sunday progressed without any particular intervention. Above symptoms have now resolved 100% however she is left with fatigue and feeling "down".  She reports some chest pain earlier this week which was nonexertional that would radiate into her left shoulder and left arm. She reports chills that has persisted since Saturday. She denies fevers, confusion, shortness of breath, dizziness, confusion, myalgias, rashes, headache, nor motor or sensory disturbances. Review of systems is positive for what she describes as urinary urgency hesitancy with mild suprapubic pain. Denies flank pain or dysuria   Review Of Systems Outlined In HPI  Past Medical History  Diagnosis Date  . Fibromyalgia   . Depression   . Allergy   . GERD (gastroesophageal reflux disease)   . Hyperthyroidism 2010    sees Dr Heather Perkins, did RAI  . Colitis, ischemic   . Shingles   . Peripheral vascular disease   . Cancer     skin    Past Surgical History  Procedure Laterality Date  . Appendectomy  1939  . Tonsillectomy    . Breast biopsy  1954  . Abdominal hysterectomy  1975    for dub  . Hemorrhoid surgery  1969  . Orif ankle fracture  1979    left  . Carpal tunnel release  1990    right  . Cataract extraction  2009    right and left.   . Cervical fusion     Family History  Problem Relation Age of Onset  . Hypertension Sister   . Cancer Daughter   . Hyperlipidemia Daughter     History   Social History  . Marital Status: Divorced    Spouse Name: N/A    Number of Children: N/A  . Years of Education: N/A    Occupational History  . Not on file.   Social History Main Topics  . Smoking status: Current Every Day Smoker -- 1.00 packs/day for 50 years    Types: Cigarettes  . Smokeless tobacco: Never Used     Comment: pt states that she has been trying to quit but can't seem to quit  . Alcohol Use: No  . Drug Use: No  . Sexual Activity: Not on file   Other Topics Concern  . Not on file   Social History Narrative  . No narrative on file     Objective: BP 152/79  Pulse 85  Wt 126 lb (57.153 kg)  SpO2 95%  General: Alert and Oriented, No Acute Distress HEENT: Pupils equal, round, reactive to light. Conjunctivae clear.  External ears unremarkable, canals clear with intact TMs with appropriate landmarks.  Middle ear appears open without effusion. Pink inferior turbinates.  Moist mucous membranes, pharynx without inflammation nor lesions.  Neck supple without palpable lymphadenopathy nor abnormal masses. Lungs: Clear to auscultation bilaterally, no wheezing/ronchi/rales.  Comfortable work of breathing. Good air movement. Cardiac: Regular rate and rhythm. Normal S1/S2.  No murmurs, rubs, nor gallops.   Abdomen: Normal bowel sounds, soft and non tender without palpable masses other than trace suprapubic pain described as pressure with palpation. No rebound or guarding in any region  of the abdomen. Extremities: No peripheral edema.  Strong peripheral pulses.  Mental Status: No depression, anxiety, nor agitation. Skin: Warm and dry.  Assessment & Plan: Heather Perkins was seen today for chest pain.  Diagnoses and associated orders for this visit:  Chest pain - PR ELECTROCARDIOGRAM, COMPLETE  Suprapubic pain - Urine culture  Food poisoning    Given her history of chest pain EKG was obtained showing normal sinus rhythm, normal axis, normal intervals, no pathologic Q waves or ST elevation or depression. Her story is suspicious for food poisoning especially that she ate rice hours before vomiting  and had no diarrhea.  Encouraged her to hydrate herself with Gatorade or any other sports drink for the next 24 hours then focus on a bland diet and advance as tolerated. Given some urinary symptoms will get urinalysis and culture.  40 minutes spent face-to-face during visit today of which at least 50% was counseling or coordinating care regarding: 1. Chest pain   2. Suprapubic pain   3. Food poisoning      Return if symptoms worsen or fail to improve.

## 2014-02-25 NOTE — Addendum Note (Signed)
Addended by: Marcial Pacas on: 02/25/2014 02:06 PM   Modules accepted: Level of Service

## 2014-02-25 NOTE — Addendum Note (Signed)
Addended by: Terance Hart on: 02/25/2014 02:14 PM   Modules accepted: Orders

## 2014-02-27 LAB — URINE CULTURE
Colony Count: NO GROWTH
Organism ID, Bacteria: NO GROWTH

## 2014-03-04 ENCOUNTER — Encounter: Payer: Self-pay | Admitting: *Deleted

## 2014-03-05 ENCOUNTER — Other Ambulatory Visit: Payer: Self-pay | Admitting: *Deleted

## 2014-03-05 MED ORDER — HYDROCODONE-ACETAMINOPHEN 10-325 MG PO TABS: 1.0000 | ORAL_TABLET | Freq: Four times a day (QID) | ORAL | Status: DC | PRN

## 2014-03-16 ENCOUNTER — Telehealth: Payer: Self-pay | Admitting: Family Medicine

## 2014-03-16 DIAGNOSIS — E0590 Thyrotoxicosis, unspecified without thyrotoxic crisis or storm: Secondary | ICD-10-CM

## 2014-03-16 NOTE — Telephone Encounter (Signed)
Pt called.  She is an existing patient at [facility] and needs a referral to be seen.  Thank you.

## 2014-03-17 NOTE — Telephone Encounter (Signed)
It's [facility]. She is seeing them for Hyperthyroidism. She sees Dr. Awilda Metro

## 2014-03-17 NOTE — Telephone Encounter (Signed)
Thanks, Referral placed

## 2014-03-17 NOTE — Telephone Encounter (Signed)
Seth Bake, Can you please clarify with patient what office "[facility]" is? Is it vascular surgery?

## 2014-03-19 ENCOUNTER — Other Ambulatory Visit: Payer: Self-pay

## 2014-03-19 MED ORDER — ALBUTEROL SULFATE HFA 108 (90 BASE) MCG/ACT IN AERS: 2.0000 | INHALATION_SPRAY | Freq: Four times a day (QID) | RESPIRATORY_TRACT | Status: DC | PRN

## 2014-04-17 ENCOUNTER — Other Ambulatory Visit: Payer: Self-pay | Admitting: *Deleted

## 2014-04-17 MED ORDER — FLUTICASONE PROPIONATE 50 MCG/ACT NA SUSP: 2.0000 | Freq: Every day | NASAL | Status: DC

## 2014-04-20 ENCOUNTER — Other Ambulatory Visit: Payer: Self-pay

## 2014-04-20 MED ORDER — HYDROCODONE-ACETAMINOPHEN 10-325 MG PO TABS: 1.0000 | ORAL_TABLET | Freq: Four times a day (QID) | ORAL | Status: DC | PRN

## 2014-05-18 ENCOUNTER — Ambulatory Visit (INDEPENDENT_AMBULATORY_CARE_PROVIDER_SITE_OTHER): Payer: Commercial Managed Care - HMO | Admitting: Family Medicine

## 2014-05-18 ENCOUNTER — Encounter: Payer: Self-pay | Admitting: Family Medicine

## 2014-05-18 VITALS — BP 121/75 | HR 77 | Wt 120.0 lb

## 2014-05-18 DIAGNOSIS — W19XXXA Unspecified fall, initial encounter: Secondary | ICD-10-CM | POA: Diagnosis not present

## 2014-05-18 DIAGNOSIS — J4489 Other specified chronic obstructive pulmonary disease: Secondary | ICD-10-CM | POA: Diagnosis not present

## 2014-05-18 DIAGNOSIS — J449 Chronic obstructive pulmonary disease, unspecified: Secondary | ICD-10-CM | POA: Diagnosis not present

## 2014-05-18 DIAGNOSIS — M5030 Other cervical disc degeneration, unspecified cervical region: Secondary | ICD-10-CM | POA: Diagnosis not present

## 2014-05-18 DIAGNOSIS — R2689 Other abnormalities of gait and mobility: Secondary | ICD-10-CM

## 2014-05-18 DIAGNOSIS — R29818 Other symptoms and signs involving the nervous system: Secondary | ICD-10-CM

## 2014-05-18 DIAGNOSIS — I739 Peripheral vascular disease, unspecified: Secondary | ICD-10-CM

## 2014-05-18 MED ORDER — HYDROCODONE-ACETAMINOPHEN 10-325 MG PO TABS: 1.0000 | ORAL_TABLET | Freq: Three times a day (TID) | ORAL | Status: DC | PRN

## 2014-05-18 MED ORDER — CYCLOBENZAPRINE HCL 10 MG PO TABS: 10.0000 mg | ORAL_TABLET | Freq: Three times a day (TID) | ORAL | Status: DC | PRN

## 2014-05-18 NOTE — Progress Notes (Signed)
CC: Heather Perkins is a 80 y.o. female is here for Follow-up COPD   Subjective: HPI:  Followup COPD: She continues to use albuterol on an as-needed basis. She tells me she uses one to 2 times less thanof the week. She will get short of breath with casual walking such as when walking around in a store. Denies cough, wheezing, or chest discomfort. Denies limb claudication. Denies fevers, chills, nor blood and sputum  Reports that one to 2 times a week she will have a close call where she will almost fall but able to catch herself before she hit the floor. This is been going on for a few months now. She's uncertain of his getting better or worse. She's not had any injury from any of these episodes. Her endocrinologist recently got a bone density test and is about to start her on IV medication for osteoporosis however she is not sure of the exact results from her DEXA scan.  Followup degenerative disc disease: Continues to have aching moderate tightness and pain in her posterior shoulders that radiates from the back of the neck. She states that severity character and frequency of pain has not changed and it still results temporarily with hydrocodone. She's requesting a refill today denies any new motor or sensory disturbances or radiation of pain into her arms.  Follow peripheral artery disease. Continues to take Pletal which has resolved her lower extremity limb claudication with walking. She denies any rest pain, chest pain nor known side effects to the Pletal   Review Of Systems Outlined In HPI  Past Medical History  Diagnosis Date  . Fibromyalgia   . Depression   . Allergy   . GERD (gastroesophageal reflux disease)   . Hyperthyroidism 2010    sees Dr Marcello Moores, did RAI  . Colitis, ischemic   . Shingles   . Peripheral vascular disease   . Cancer     skin    Past Surgical History  Procedure Laterality Date  . Appendectomy  1939  . Tonsillectomy    . Breast biopsy  1954  . Abdominal  hysterectomy  1975    for dub  . Hemorrhoid surgery  1969  . Orif ankle fracture  1979    left  . Carpal tunnel release  1990    right  . Cataract extraction  2009    right and left.   . Cervical fusion     Family History  Problem Relation Age of Onset  . Hypertension Sister   . Cancer Daughter   . Hyperlipidemia Daughter     History   Social History  . Marital Status: Divorced    Spouse Name: N/A    Number of Children: N/A  . Years of Education: N/A   Occupational History  . Not on file.   Social History Main Topics  . Smoking status: Current Every Day Smoker -- 1.00 packs/day for 50 years    Types: Cigarettes  . Smokeless tobacco: Never Used     Comment: pt states that she has been trying to quit but can't seem to quit  . Alcohol Use: No  . Drug Use: No  . Sexual Activity: Not on file   Other Topics Concern  . Not on file   Social History Narrative  . No narrative on file     Objective: BP 121/75  Pulse 77  Wt 120 lb (54.432 kg)  General: Alert and Oriented, No Acute Distress HEENT: Pupils equal, round, reactive to light.  Conjunctivae clear.  Moist mucous membranes pharynx unremarkable Lungs: Clear to auscultation bilaterally, no wheezing/ronchi/rales.  Comfortable work of breathing. Good air movement. Cardiac: Regular rate and rhythm. Normal S1/S2.  No murmurs, rubs, nor gallops. Neck: Full range of motion strength in the cervical spine without midline spinous process tenderness   Extremities: No peripheral edema.  Strong peripheral pulses. Full range of motion strength in both upper and lower extremities Mental Status: No depression, anxiety, nor agitation. Skin: Warm and dry.  Assessment & Plan: Sumie was seen today for follow-up copd.  Diagnoses and associated orders for this visit:  Chronic obstructive pulmonary disease, unspecified COPD, unspecified chronic bronchitis type  Poor balance - Ambulatory referral to Physical Therapy  Falls,  initial encounter - Ambulatory referral to Physical Therapy  DDD (degenerative disc disease), cervical - cyclobenzaprine (FLEXERIL) 10 MG tablet; Take 1 tablet (10 mg total) by mouth 3 (three) times daily as needed. - HYDROcodone-acetaminophen (NORCO) 10-325 MG per tablet; Take 1 tablet by mouth every 8 (eight) hours as needed.  Claudication of right lower extremity    COPD: Uncontrolled I've encouraged her to start Spiriva however she declines Poor balance with falls: Referral to PT for proprioception and balance rehabilitation Degenerative disc disease: Stable, continue as needed Flexeril and hydrocodone PAD and claudication of the extremities: Stable continue Pletal  40 minutes spent face-to-face during visit today of which at least 50% was counseling or coordinating care regarding: 1. Chronic obstructive pulmonary disease, unspecified COPD, unspecified chronic bronchitis type   2. Poor balance   3. Falls, initial encounter   4. DDD (degenerative disc disease), cervical   5. Claudication of right lower extremity       Return in about 3 months (around 08/18/2014).

## 2014-06-17 ENCOUNTER — Other Ambulatory Visit: Payer: Self-pay

## 2014-06-17 DIAGNOSIS — I739 Peripheral vascular disease, unspecified: Secondary | ICD-10-CM

## 2014-06-17 MED ORDER — CILOSTAZOL 100 MG PO TABS: 100.0000 mg | ORAL_TABLET | Freq: Two times a day (BID) | ORAL | Status: DC

## 2014-07-08 ENCOUNTER — Telehealth: Payer: Self-pay | Admitting: Emergency Medicine

## 2014-07-08 NOTE — Telephone Encounter (Signed)
Patient calling today requesting refill for Norco; states chronic pain in hips, back and legs keeps her awake at night. Her next appt. with you was for 11/10, but we have changed it to 07/27/2014 so she can discuss pain issues and management with you. She hopes for refill to be  picked up tomorrow. PKlaers, RN

## 2014-07-10 ENCOUNTER — Other Ambulatory Visit: Payer: Self-pay

## 2014-07-10 DIAGNOSIS — M5030 Other cervical disc degeneration, unspecified cervical region: Secondary | ICD-10-CM

## 2014-07-10 MED ORDER — HYDROCODONE-ACETAMINOPHEN 10-325 MG PO TABS: 1.0000 | ORAL_TABLET | Freq: Three times a day (TID) | ORAL | Status: DC | PRN

## 2014-07-27 ENCOUNTER — Ambulatory Visit (INDEPENDENT_AMBULATORY_CARE_PROVIDER_SITE_OTHER): Payer: Medicare HMO | Admitting: Family Medicine

## 2014-07-27 ENCOUNTER — Encounter: Payer: Self-pay | Admitting: Family Medicine

## 2014-07-27 ENCOUNTER — Ambulatory Visit: Payer: Medicare HMO | Admitting: Family Medicine

## 2014-07-27 VITALS — BP 133/78 | HR 111 | Wt 117.0 lb

## 2014-07-27 DIAGNOSIS — I70219 Atherosclerosis of native arteries of extremities with intermittent claudication, unspecified extremity: Secondary | ICD-10-CM

## 2014-07-27 DIAGNOSIS — M546 Pain in thoracic spine: Secondary | ICD-10-CM

## 2014-07-27 NOTE — Progress Notes (Signed)
CC: Heather Perkins is a 80 y.o. female is here for discuss pain   Subjective: HPI:  Complains of right back pain that is localized just below and medially from the posterior axillary line. Symptoms began one week ago gradually without any inciting event and or moderate to severe in severity up until Saturday. Without any particular intervention it has now gone away 99.9%. Was improved with heating wall pain was present but nothing else seemed to make better or worse. Denies any positional component to the pain. Denies any exertional component to the pain. Denies any shortness of breath, wheezing, cough, nor overlying skin change. No pain in the right shoulder or right upper extremity. Denies any motor sensory disturbances  She would like clarification on why she gets claudication in both lower extremities. Since restarting Pletal months ago she's had a drastic improvement of pain and still gets a throbbing cramping sensation in the calves with walking however it less than mild in severity. Symptoms improved with rest. She denies claudication elsewhere denies any new weakness nor exertional chest pain.   Review Of Systems Outlined In HPI  Past Medical History  Diagnosis Date  . Fibromyalgia   . Depression   . Allergy   . GERD (gastroesophageal reflux disease)   . Hyperthyroidism 2010    sees Dr Marcello Moores, did RAI  . Colitis, ischemic   . Shingles   . Peripheral vascular disease   . Cancer     skin    Past Surgical History  Procedure Laterality Date  . Appendectomy  1939  . Tonsillectomy    . Breast biopsy  1954  . Abdominal hysterectomy  1975    for dub  . Hemorrhoid surgery  1969  . Orif ankle fracture  1979    left  . Carpal tunnel release  1990    right  . Cataract extraction  2009    right and left.   . Cervical fusion     Family History  Problem Relation Age of Onset  . Hypertension Sister   . Cancer Daughter   . Hyperlipidemia Daughter     History   Social History   . Marital Status: Divorced    Spouse Name: N/A    Number of Children: N/A  . Years of Education: N/A   Occupational History  . Not on file.   Social History Main Topics  . Smoking status: Current Every Day Smoker -- 1.00 packs/day for 50 years    Types: Cigarettes  . Smokeless tobacco: Never Used     Comment: pt states that she has been trying to quit but can't seem to quit  . Alcohol Use: No  . Drug Use: No  . Sexual Activity: Not on file   Other Topics Concern  . Not on file   Social History Narrative  . No narrative on file     Objective: BP 133/78  Pulse 111  Wt 117 lb (53.071 kg)  General: Alert and Oriented, No Acute Distress HEENT: Pupils equal, round, reactive to light. Conjunctivae clear.  Moist mucous membranes pharynx unremarkable Lungs: Clear to auscultation bilaterally, no wheezing/ronchi/rales.  Comfortable work of breathing. Good air movement. Cardiac: Regular rate and rhythm. Normal S1/S2.  No murmurs, rubs, nor gallops.   Chest: No midline spinous process tenderness in the lumbar region, no skin changes overlying the site of her pain. No crepitus or palpable masses at the site of her pain. Full range of motion and strength in both upper extremities  without reproduction of pain Extremities: No peripheral edema.   Mental Status: No depression, anxiety, nor agitation. Skin: Warm and dry.  Assessment & Plan: Heather Perkins was seen today for discuss pain.  Diagnoses and associated orders for this visit:  Right-sided thoracic back pain  Atherosclerosis of native arteries of extremity with intermittent claudication    Reassurance was provided that her right thoracic back pain is most likely due to a pulled muscle and has resolved on its own as would be expected. No further intervention. Time was taken to discuss how her mild and controlled lower extreme claudication is related to narrowing and atherosclerosis of the arteries in both lower extremities.  Discussed  the mechanism of Pletal and encouraged her to continue with walking to help prevent any further progression of her disease  25 minutes spent face-to-face during visit today of which at least 50% was counseling or coordinating care regarding: 1. Right-sided thoracic back pain   2. Atherosclerosis of native arteries of extremity with intermittent claudication      Return if symptoms worsen or fail to improve.

## 2014-08-04 ENCOUNTER — Telehealth: Payer: Self-pay

## 2014-08-04 NOTE — Telephone Encounter (Signed)
I called to speak with patient about refills on her Xanax and Flexeril.

## 2014-08-12 ENCOUNTER — Other Ambulatory Visit: Payer: Self-pay

## 2014-08-12 DIAGNOSIS — M5030 Other cervical disc degeneration, unspecified cervical region: Secondary | ICD-10-CM

## 2014-08-12 MED ORDER — ALPRAZOLAM 0.5 MG PO TABS: 0.5000 mg | ORAL_TABLET | Freq: Four times a day (QID) | ORAL | Status: DC | PRN

## 2014-08-12 MED ORDER — CYCLOBENZAPRINE HCL 10 MG PO TABS: 10.0000 mg | ORAL_TABLET | Freq: Three times a day (TID) | ORAL | Status: DC | PRN

## 2014-08-13 NOTE — Telephone Encounter (Signed)
Sent in refills 

## 2014-08-18 ENCOUNTER — Ambulatory Visit: Payer: Medicare HMO | Admitting: Family Medicine

## 2014-08-18 ENCOUNTER — Other Ambulatory Visit: Payer: Self-pay

## 2014-08-18 DIAGNOSIS — M5030 Other cervical disc degeneration, unspecified cervical region: Secondary | ICD-10-CM

## 2014-08-18 MED ORDER — HYDROCODONE-ACETAMINOPHEN 10-325 MG PO TABS: 1.0000 | ORAL_TABLET | Freq: Three times a day (TID) | ORAL | Status: DC | PRN

## 2014-10-19 ENCOUNTER — Other Ambulatory Visit: Payer: Self-pay

## 2014-10-19 ENCOUNTER — Other Ambulatory Visit: Payer: Self-pay | Admitting: *Deleted

## 2014-10-19 DIAGNOSIS — I739 Peripheral vascular disease, unspecified: Secondary | ICD-10-CM

## 2014-10-19 DIAGNOSIS — M5030 Other cervical disc degeneration, unspecified cervical region: Secondary | ICD-10-CM

## 2014-10-19 MED ORDER — CILOSTAZOL 100 MG PO TABS: ORAL_TABLET | ORAL | Status: DC

## 2014-10-19 MED ORDER — HYDROCODONE-ACETAMINOPHEN 10-325 MG PO TABS: 1.0000 | ORAL_TABLET | Freq: Three times a day (TID) | ORAL | Status: DC | PRN

## 2014-11-04 ENCOUNTER — Telehealth: Payer: Self-pay | Admitting: Family Medicine

## 2014-11-04 DIAGNOSIS — M5030 Other cervical disc degeneration, unspecified cervical region: Secondary | ICD-10-CM

## 2014-11-04 MED ORDER — CYCLOBENZAPRINE HCL 10 MG PO TABS: 10.0000 mg | ORAL_TABLET | Freq: Three times a day (TID) | ORAL | Status: DC | PRN

## 2014-11-04 NOTE — Telephone Encounter (Signed)
Refill req 

## 2014-11-16 DIAGNOSIS — R69 Illness, unspecified: Secondary | ICD-10-CM | POA: Diagnosis not present

## 2014-11-16 DIAGNOSIS — M810 Age-related osteoporosis without current pathological fracture: Secondary | ICD-10-CM | POA: Diagnosis not present

## 2014-11-16 DIAGNOSIS — E049 Nontoxic goiter, unspecified: Secondary | ICD-10-CM | POA: Diagnosis not present

## 2014-11-16 DIAGNOSIS — E0590 Thyrotoxicosis, unspecified without thyrotoxic crisis or storm: Secondary | ICD-10-CM | POA: Diagnosis not present

## 2014-11-16 DIAGNOSIS — J0110 Acute frontal sinusitis, unspecified: Secondary | ICD-10-CM | POA: Diagnosis not present

## 2014-11-25 ENCOUNTER — Ambulatory Visit (INDEPENDENT_AMBULATORY_CARE_PROVIDER_SITE_OTHER): Payer: Medicare HMO | Admitting: Family Medicine

## 2014-11-25 ENCOUNTER — Encounter: Payer: Self-pay | Admitting: Family Medicine

## 2014-11-25 VITALS — BP 126/76 | HR 85 | Wt 125.0 lb

## 2014-11-25 DIAGNOSIS — R519 Headache, unspecified: Secondary | ICD-10-CM

## 2014-11-25 DIAGNOSIS — M5030 Other cervical disc degeneration, unspecified cervical region: Secondary | ICD-10-CM

## 2014-11-25 DIAGNOSIS — I739 Peripheral vascular disease, unspecified: Secondary | ICD-10-CM | POA: Diagnosis not present

## 2014-11-25 DIAGNOSIS — R51 Headache: Secondary | ICD-10-CM | POA: Diagnosis not present

## 2014-11-25 MED ORDER — HYDROCODONE-ACETAMINOPHEN 10-325 MG PO TABS: 1.0000 | ORAL_TABLET | Freq: Three times a day (TID) | ORAL | Status: DC | PRN

## 2014-11-25 MED ORDER — CILOSTAZOL 100 MG PO TABS: ORAL_TABLET | ORAL | Status: DC

## 2014-11-25 MED ORDER — VENLAFAXINE HCL ER 75 MG PO CP24: 75.0000 mg | ORAL_CAPSULE | Freq: Every day | ORAL | Status: DC

## 2014-11-25 NOTE — Progress Notes (Signed)
CC: Heather Perkins is a 81 y.o. female is here for Medication Management   Subjective: HPI:  Follow up claudication of right lower extremity: She's been taking  pletal 2 times a day and has not had lower extremity pain. She is walking a mile a day without any limb claudication. She denies any sores poorly healing wounds or skin changes in the right or left lower extremity.   Follow degenerative disc disease: Continues to have neck stiffness and soreness in the posterior aspect of the neck in the midline is not radiating. Pain is slightly improved with hydrocodone however she always has some degree of pain and it seems like every day now the worst the pain gets more frequently she gets a headache that involves the entire head which is constant and feels like pressure which nothing seems to help. Cyclobenzaprine is not providing any benefit. She denies any accompanying motor or sensory disturbances recently or remotely. Denies any recent head trauma. Denies vision loss.  Denies photophobia, nausea, fevers or chills  Review Of Systems Outlined In HPI  Past Medical History  Diagnosis Date  . Fibromyalgia   . Depression   . Allergy   . GERD (gastroesophageal reflux disease)   . Hyperthyroidism 2010    sees Dr Marcello Moores, did RAI  . Colitis, ischemic   . Shingles   . Peripheral vascular disease   . Cancer     skin    Past Surgical History  Procedure Laterality Date  . Appendectomy  1939  . Tonsillectomy    . Breast biopsy  1954  . Abdominal hysterectomy  1975    for dub  . Hemorrhoid surgery  1969  . Orif ankle fracture  1979    left  . Carpal tunnel release  1990    right  . Cataract extraction  2009    right and left.   . Cervical fusion     Family History  Problem Relation Age of Onset  . Hypertension Sister   . Cancer Daughter   . Hyperlipidemia Daughter     History   Social History  . Marital Status: Divorced    Spouse Name: N/A  . Number of Children: N/A  . Years  of Education: N/A   Occupational History  . Not on file.   Social History Main Topics  . Smoking status: Current Every Day Smoker -- 1.00 packs/day for 50 years    Types: Cigarettes  . Smokeless tobacco: Never Used     Comment: pt states that she has been trying to quit but can't seem to quit  . Alcohol Use: No  . Drug Use: No  . Sexual Activity: Not on file   Other Topics Concern  . Not on file   Social History Narrative     Objective: BP 126/76 mmHg  Pulse 85  Wt 125 lb (56.7 kg)  General: Alert and Oriented, No Acute Distress HEENT: Pupils equal, round, reactive to light. Conjunctivae clear.  External ears unremarkable, canals clear with intact TMs with appropriate landmarks.  Middle ear appears open without effusion. Pink inferior turbinates.  Moist mucous membranes, pharynx without inflammation nor lesions.  Neck supple without palpable lymphadenopathy nor abnormal masses. Lungs: Clear to auscultation bilaterally, no wheezing/ronchi/rales.  Comfortable work of breathing. Good air movement. Cardiac: Regular rate and rhythm. Normal S1/S2.  No murmurs, rubs, nor gallops.   Neuro: Cranial nerves II through XII grossly intact Extremities: No peripheral edema.  Strong peripheral pulses.  Mental Status:  No depression, anxiety, nor agitation. Skin: Warm and dry.  Assessment & Plan: Heather Perkins was seen today for medication management.  Diagnoses and all orders for this visit:  DDD (degenerative disc disease), cervical Orders: -     HYDROcodone-acetaminophen (NORCO) 10-325 MG per tablet; Take 1 tablet by mouth every 8 (eight) hours as needed.  Claudication of right lower extremity Orders: -     Lipid panel -     cilostazol (PLETAL) 100 MG tablet; Take 1 tablet by mouth two times a day  Nonintractable headache, unspecified chronicity pattern, unspecified headache type Orders: -     venlafaxine XR (EFFEXOR XR) 75 MG 24 hr capsule; Take 1 capsule (75 mg total) by mouth daily with  breakfast. To help headache and mood.   Degenerative disc disease: Partially Controlled with hydrocodone use refills provided, adding Effexor below Claudication of the extremity: Controlled with pletal, she is overdue for a lipid panel, continue pravastatin pending results. I reminded her that she is overdue for follow-up with her vascular surgeon. Headache: Suspect a lot of her headache has to do with the lingering pain from her cervical disc disease, I like to see if her headaches and/or neck pain improved with starting Effexor.  40 minutes spent face-to-face during visit today of which at least 50% was counseling or coordinating care regarding: 1. DDD (degenerative disc disease), cervical   2. Claudication of right lower extremity   3. Nonintractable headache, unspecified chronicity pattern, unspecified headache type       Return in about 3 months (around 02/23/2015).

## 2014-12-01 DIAGNOSIS — I739 Peripheral vascular disease, unspecified: Secondary | ICD-10-CM | POA: Diagnosis not present

## 2014-12-01 DIAGNOSIS — Z79899 Other long term (current) drug therapy: Secondary | ICD-10-CM | POA: Diagnosis not present

## 2014-12-02 ENCOUNTER — Telehealth: Payer: Self-pay | Admitting: Family Medicine

## 2014-12-02 LAB — LIPID PANEL
Cholesterol: 229 mg/dL — ABNORMAL HIGH (ref 0–200)
HDL: 71 mg/dL (ref >=46)
LDL Cholesterol: 120 mg/dL — ABNORMAL HIGH (ref 0–99)
Total CHOL/HDL Ratio: 3.2 Ratio
Triglycerides: 189 mg/dL — ABNORMAL HIGH (ref <150)
VLDL: 38 mg/dL (ref 0–40)

## 2014-12-02 MED ORDER — PRAVASTATIN SODIUM 80 MG PO TABS: 80.0000 mg | ORAL_TABLET | Freq: Every day | ORAL | Status: DC

## 2014-12-02 NOTE — Telephone Encounter (Signed)
Seth Bake, Will you please let patient know that her LDL cholesterol is not at goal, I'd recommend increasing her pravastatin dose to 80mg  daily, I've sent a new Rx of this to [facility].

## 2014-12-02 NOTE — Telephone Encounter (Signed)
Left message on vm

## 2015-01-20 ENCOUNTER — Encounter: Payer: Self-pay | Admitting: Family Medicine

## 2015-01-20 ENCOUNTER — Ambulatory Visit (INDEPENDENT_AMBULATORY_CARE_PROVIDER_SITE_OTHER): Payer: Commercial Managed Care - HMO | Admitting: Family Medicine

## 2015-01-20 VITALS — BP 115/67 | HR 86 | Wt 126.0 lb

## 2015-01-20 DIAGNOSIS — R3911 Hesitancy of micturition: Secondary | ICD-10-CM | POA: Diagnosis not present

## 2015-01-20 DIAGNOSIS — M5030 Other cervical disc degeneration, unspecified cervical region: Secondary | ICD-10-CM

## 2015-01-20 DIAGNOSIS — M25551 Pain in right hip: Secondary | ICD-10-CM | POA: Diagnosis not present

## 2015-01-20 LAB — POCT URINALYSIS DIPSTICK
Bilirubin, UA: NEGATIVE
Blood, UA: NEGATIVE
Glucose, UA: NEGATIVE
Ketones, UA: NEGATIVE
Nitrite, UA: POSITIVE
Protein, UA: NEGATIVE
Spec Grav, UA: 1.015
Urobilinogen, UA: 1
pH, UA: 6

## 2015-01-20 MED ORDER — HYDROCODONE-ACETAMINOPHEN 10-325 MG PO TABS: 1.0000 | ORAL_TABLET | Freq: Three times a day (TID) | ORAL | Status: DC | PRN

## 2015-01-20 MED ORDER — CIPROFLOXACIN HCL 250 MG PO TABS: ORAL_TABLET | ORAL | Status: AC

## 2015-01-20 MED ORDER — MELOXICAM 15 MG PO TABS: 15.0000 mg | ORAL_TABLET | Freq: Every day | ORAL | Status: DC

## 2015-01-20 NOTE — Progress Notes (Signed)
CC: Heather Perkins is a 81 y.o. female is here for right hip pain   Subjective: HPI:  Urinary hesitancy with urinary frequency that has been present on a daily basis at least for the last week. Awakening up to 9 times at night to urinate with only small volumes of voiding. Accompanied by suprapubic pain that radiates into the back and right and left lower quadrant. No dysuria nor change in color or odor of urine. No interventions as of yet. Denies any other genitourinary complaints. No constipation diarrhea or any other gastrointestinal complaints. No fevers, chills, nausea or vomiting  Right hip pain localized in the right anterior hip with some radiation down the anterior aspect of the right thigh. Pain is worse when standing or walking. Improves with sitting. Also improves with ibuprofen 800 mg. Like and has dulled the pain only slightly. She denies any recent falls or trauma. No motor or sensory disturbances in the lower extremities nor new claudication. Denies lateral hip pain.   Review Of Systems Outlined In HPI  Past Medical History  Diagnosis Date  . Fibromyalgia   . Depression   . Allergy   . GERD (gastroesophageal reflux disease)   . Hyperthyroidism 2010    sees Dr Marcello Moores, did RAI  . Colitis, ischemic   . Shingles   . Peripheral vascular disease   . Cancer     skin    Past Surgical History  Procedure Laterality Date  . Appendectomy  1939  . Tonsillectomy    . Breast biopsy  1954  . Abdominal hysterectomy  1975    for dub  . Hemorrhoid surgery  1969  . Orif ankle fracture  1979    left  . Carpal tunnel release  1990    right  . Cataract extraction  2009    right and left.   . Cervical fusion     Family History  Problem Relation Age of Onset  . Hypertension Sister   . Cancer Daughter   . Hyperlipidemia Daughter     History   Social History  . Marital Status: Divorced    Spouse Name: N/A  . Number of Children: N/A  . Years of Education: N/A    Occupational History  . Not on file.   Social History Main Topics  . Smoking status: Current Every Day Smoker -- 1.00 packs/day for 50 years    Types: Cigarettes  . Smokeless tobacco: Never Used     Comment: pt states that she has been trying to quit but can't seem to quit  . Alcohol Use: No  . Drug Use: No  . Sexual Activity: Not on file   Other Topics Concern  . Not on file   Social History Narrative     Objective: BP 115/67 mmHg  Pulse 86  Wt 126 lb (57.153 kg)  General: Alert and Oriented, No Acute Distress HEENT: Pupils equal, round, reactive to light. Conjunctivae clear.  Moist mucous membranes Lungs: Clear to auscultation bilaterally, no wheezing/ronchi/rales.  Comfortable work of breathing. Good air movement. Cardiac: Regular rate and rhythm. Normal S1/S2.  No murmurs, rubs, nor gallops.   Extremities: No peripheral edema.  Strong peripheral pulses. Exam of the right lower externally shows full range of motion and strength. No pain with log roll, internal or external rotation of the femur. Faber negative, fadir negative, no pain over right greater trochanter when palpated. Mental Status: No depression, anxiety, nor agitation. Skin: Warm and dry.  Assessment & Plan:  Heather Perkins was seen today for right hip pain.  Diagnoses and all orders for this visit:  Urinary hesitancy Orders: -     POCT urinalysis dipstick -     Urine culture -     ciprofloxacin (CIPRO) 250 MG tablet; Take one by mouth twice a day for five days.  Right hip pain  DDD (degenerative disc disease), cervical Orders: -     HYDROcodone-acetaminophen (NORCO) 10-325 MG per tablet; Take 1 tablet by mouth every 8 (eight) hours as needed.  Other orders -     meloxicam (MOBIC) 15 MG tablet; Take 1 tablet (15 mg total) by mouth daily.   Urinary hesitancy: Urinalysis highly suggestive of UTI therefore start Cipro, will follow culture. Right hip pain: Suspect his pain is coming from muscular or  ligamentous inflammation therefore start meloxicam, low suspicion for intra-articular source of pain at this time. Return in 2-4 weeks if pain persists.  She is requesting a refill on Norco for her chronic back pain which has not changed in severity character or frequency.  Return if symptoms worsen or fail to improve.

## 2015-01-22 LAB — URINE CULTURE: Colony Count: 1000

## 2015-02-03 ENCOUNTER — Other Ambulatory Visit: Payer: Self-pay | Admitting: *Deleted

## 2015-02-03 MED ORDER — ALPRAZOLAM 0.5 MG PO TABS: 0.5000 mg | ORAL_TABLET | Freq: Four times a day (QID) | ORAL | Status: DC | PRN

## 2015-02-05 ENCOUNTER — Telehealth: Payer: Self-pay

## 2015-02-05 DIAGNOSIS — R300 Dysuria: Secondary | ICD-10-CM

## 2015-02-05 NOTE — Telephone Encounter (Signed)
Referral has been placed. 

## 2015-02-05 NOTE — Telephone Encounter (Signed)
Patient states she still has pain associated with the UTI. I advised her we could refer her to Urology and agreed. Denies fever, chills or sweats.

## 2015-02-18 ENCOUNTER — Telehealth: Payer: Self-pay

## 2015-02-18 NOTE — Telephone Encounter (Signed)
Patient called stating [facility] cannot see her until August. She also is having pain in her "hip joint".   I called [facility] and she is on their schedule for Monday May 16 th at 8:30 am. I called patient back and left a voicemail with this information.   I scheduled an acute visit with Dr Ileene Rubens for the hip joint pain.

## 2015-02-18 NOTE — Telephone Encounter (Signed)
Thanks Angela

## 2015-02-19 ENCOUNTER — Ambulatory Visit (INDEPENDENT_AMBULATORY_CARE_PROVIDER_SITE_OTHER): Payer: Commercial Managed Care - HMO | Admitting: Family Medicine

## 2015-02-19 ENCOUNTER — Encounter: Payer: Self-pay | Admitting: Family Medicine

## 2015-02-19 VITALS — BP 130/73 | HR 74 | Wt 125.0 lb

## 2015-02-19 DIAGNOSIS — M5417 Radiculopathy, lumbosacral region: Secondary | ICD-10-CM

## 2015-02-19 DIAGNOSIS — M5416 Radiculopathy, lumbar region: Secondary | ICD-10-CM

## 2015-02-19 MED ORDER — PREDNISONE 20 MG PO TABS: ORAL_TABLET | ORAL | Status: AC

## 2015-02-19 NOTE — Progress Notes (Signed)
CC: Heather Perkins is a 81 y.o. female is here for Hip Pain   Subjective: HPI:  Complains of right hip pain that seems to be coming from the right lower back radiates around the right hip into the groin and down the front of the right leg. Improves when sitting and legs are out stretched in front of her. Worse when standing or walking long distances. Nothing else seems to make symptoms better or worse pain is moderate in severity. She tells me it feels different than pain she attributes to osteoarthritis in the past. Pain is described as a burning and deep discomfort. She denies weakness or any other motor or sensory disturbances in the lower extremities. Denies midline back pain. No recent trauma or exertion. Pain has been present for at least a month now. No benefit from meloxicam. Denies urinary or fecal incontinence. No skin changes at site of discomfort   Review Of Systems Outlined In HPI  Past Medical History  Diagnosis Date  . Fibromyalgia   . Depression   . Allergy   . GERD (gastroesophageal reflux disease)   . Hyperthyroidism 2010    sees Dr Marcello Moores, did RAI  . Colitis, ischemic   . Shingles   . Peripheral vascular disease   . Cancer     skin    Past Surgical History  Procedure Laterality Date  . Appendectomy  1939  . Tonsillectomy    . Breast biopsy  1954  . Abdominal hysterectomy  1975    for dub  . Hemorrhoid surgery  1969  . Orif ankle fracture  1979    left  . Carpal tunnel release  1990    right  . Cataract extraction  2009    right and left.   . Cervical fusion     Family History  Problem Relation Age of Onset  . Hypertension Sister   . Cancer Daughter   . Hyperlipidemia Daughter     History   Social History  . Marital Status: Divorced    Spouse Name: N/A  . Number of Children: N/A  . Years of Education: N/A   Occupational History  . Not on file.   Social History Main Topics  . Smoking status: Current Every Day Smoker -- 1.00 packs/day for 50  years    Types: Cigarettes  . Smokeless tobacco: Never Used     Comment: pt states that she has been trying to quit but can't seem to quit  . Alcohol Use: No  . Drug Use: No  . Sexual Activity: Not on file   Other Topics Concern  . Not on file   Social History Narrative     Objective: BP 130/73 mmHg  Pulse 74  Wt 125 lb (56.7 kg)  Vital signs reviewed. General: Alert and Oriented, No Acute Distress HEENT: Pupils equal, round, reactive to light. Conjunctivae clear.  External ears unremarkable.  Moist mucous membranes. Lungs: Clear and comfortable work of breathing, speaking in full sentences without accessory muscle use. Cardiac: Regular rate and rhythm.  Neuro: CN II-XII grossly intact, gait normal. Extremities: No peripheral edema.  Strong peripheral pulses. Full range of motion and strength in the right lower extremity. Straight leg raise is negative. No pain with internal or external rotation of the femur. No pain over the greater trochanter. No midline spinous process pain or reproducible back pain with palpation Mental Status: No depression, anxiety, nor agitation. Logical though process. Skin: Warm and dry. Assessment & Plan: Heather Perkins was  seen today for hip pain.  Diagnoses and all orders for this visit:  Right lumbar radiculitis Orders: -     predniSONE (DELTASONE) 20 MG tablet; Three tabs daily days 1-3, two tabs daily days 4-6, one tab daily days 7-9, half tab daily days 10-13.   Begin Prednisone taper for suspected lumbar radiculitis. If no improvement at the end of this regimen call so I can put in orders for a right pelvis x-ray.  Return if symptoms worsen or fail to improve.

## 2015-02-23 ENCOUNTER — Ambulatory Visit (INDEPENDENT_AMBULATORY_CARE_PROVIDER_SITE_OTHER): Payer: Commercial Managed Care - HMO | Admitting: Family Medicine

## 2015-02-23 ENCOUNTER — Ambulatory Visit (INDEPENDENT_AMBULATORY_CARE_PROVIDER_SITE_OTHER): Payer: Commercial Managed Care - HMO

## 2015-02-23 ENCOUNTER — Encounter: Payer: Self-pay | Admitting: Family Medicine

## 2015-02-23 VITALS — BP 124/74 | HR 93 | Wt 126.0 lb

## 2015-02-23 DIAGNOSIS — N390 Urinary tract infection, site not specified: Secondary | ICD-10-CM

## 2015-02-23 DIAGNOSIS — E0590 Thyrotoxicosis, unspecified without thyrotoxic crisis or storm: Secondary | ICD-10-CM

## 2015-02-23 DIAGNOSIS — M1611 Unilateral primary osteoarthritis, right hip: Secondary | ICD-10-CM

## 2015-02-23 DIAGNOSIS — E785 Hyperlipidemia, unspecified: Secondary | ICD-10-CM | POA: Diagnosis not present

## 2015-02-23 DIAGNOSIS — M8588 Other specified disorders of bone density and structure, other site: Secondary | ICD-10-CM | POA: Diagnosis not present

## 2015-02-23 DIAGNOSIS — M5030 Other cervical disc degeneration, unspecified cervical region: Secondary | ICD-10-CM | POA: Diagnosis not present

## 2015-02-23 MED ORDER — HYDROCODONE-ACETAMINOPHEN 10-325 MG PO TABS: 1.0000 | ORAL_TABLET | Freq: Three times a day (TID) | ORAL | Status: DC | PRN

## 2015-02-23 NOTE — Progress Notes (Signed)
CC: Heather Perkins is a 81 y.o. female is here for Follow-up   Subjective: HPI:  Follow recurrent UTI: Over the past 24 hours she's been experiencing moderate urinary hesitancy. Denies dysuria nor urgency. Questionable frequency. Denies fevers, chills, nausea or flank pain.  Follow-up degenerative disc disease of the neck: She is requesting refills on Norco. She is taking this 3 times a day provided she takes it at this frequency she denies any neck pain that interferes with quality of life. Denies any constipation or known side effects. No new motor or sensory disturbances in the upper extremities  Follow-up hyperthyroidism: She continues to see endocrinology for this. No new unintentional weight loss or gain  Follow-up hyperlipidemia: Since I saw her 3 months ago she was started on a full dose of 80 mg pravastatin daily. She denies any right upper quadrant pain or myalgias. No return of limb claudication or chest pain.  Complains of continued right hip pain. For the first 2 days on prednisone she's tells me she felt great and pain-free in the right foot. Pain has now returned she feels it in the right buttock radiates into the right groin and then down the proximal anterior thigh. Worse with walking for long periods of time. No pain with resting.   Review Of Systems Outlined In HPI  Past Medical History  Diagnosis Date  . Fibromyalgia   . Depression   . Allergy   . GERD (gastroesophageal reflux disease)   . Hyperthyroidism 2010    sees Dr Marcello Moores, did RAI  . Colitis, ischemic   . Shingles   . Peripheral vascular disease   . Cancer     skin    Past Surgical History  Procedure Laterality Date  . Appendectomy  1939  . Tonsillectomy    . Breast biopsy  1954  . Abdominal hysterectomy  1975    for dub  . Hemorrhoid surgery  1969  . Orif ankle fracture  1979    left  . Carpal tunnel release  1990    right  . Cataract extraction  2009    right and left.   . Cervical fusion      Family History  Problem Relation Age of Onset  . Hypertension Sister   . Cancer Daughter   . Hyperlipidemia Daughter     History   Social History  . Marital Status: Divorced    Spouse Name: N/A  . Number of Children: N/A  . Years of Education: N/A   Occupational History  . Not on file.   Social History Main Topics  . Smoking status: Current Every Day Smoker -- 1.00 packs/day for 50 years    Types: Cigarettes  . Smokeless tobacco: Never Used     Comment: pt states that she has been trying to quit but can't seem to quit  . Alcohol Use: No  . Drug Use: No  . Sexual Activity: Not on file   Other Topics Concern  . Not on file   Social History Narrative     Objective: BP 124/74 mmHg  Pulse 93  Wt 126 lb (57.153 kg)  General: Alert and Oriented, No Acute Distress HEENT: Pupils equal, round, reactive to light. Conjunctivae clear.  Moist mucous membranes pharynx unremarkable Lungs: Clear to auscultation bilaterally, no wheezing/ronchi/rales.  Comfortable work of breathing. Good air movement. Cardiac: Regular rate and rhythm. Normal S1/S2.  No murmurs, rubs, nor gallops.   Abdomen: Soft and flat Extremities: No peripheral edema.  Strong  peripheral pulses. Full range of motion and strength of both lower extremities gait is normal Mental Status: No depression, anxiety, nor agitation. Skin: Warm and dry.  Assessment & Plan: Heather Perkins was seen today for follow-up.  Diagnoses and all orders for this visit:  DDD (degenerative disc disease), cervical Orders: -     HYDROcodone-acetaminophen (NORCO) 10-325 MG per tablet; Take 1 tablet by mouth every 8 (eight) hours as needed.  Recurrent UTI Orders: -     Urinalysis, Routine w reflex microscopic -     Urine culture  Hyperthyroidism  Hyperlipidemia Orders: -     Lipid panel  Primary osteoarthritis of right hip Orders: -     DG HIP UNILAT WITH PELVIS 2-3 VIEWS RIGHT; Future   Degenerative disc disease: Stable and  controlled on Norco  recurrent UTI: Checking urinalysis and urine culture given new urinary hesitancy, no antibodies at this time pending results   hyperthyroidism: Continues to be managed by endocrinology   hyperlipidemia: Clinically controlled to for repeat lipid panel, continue pravastatin pending results Right hip pain: Obtaining new x-rays to determine if osteoarthritis has progressed to determine if she would benefit from visiting with Dr. Darene Lamer in our sports medicine clinic   Return in about 3 months (around 05/26/2015).

## 2015-02-24 LAB — URINALYSIS, ROUTINE W REFLEX MICROSCOPIC
Bilirubin Urine: NEGATIVE
Glucose, UA: NEGATIVE mg/dL
Hgb urine dipstick: NEGATIVE
Ketones, ur: NEGATIVE mg/dL
Leukocytes, UA: NEGATIVE
Nitrite: NEGATIVE
Protein, ur: NEGATIVE mg/dL
Specific Gravity, Urine: <1.005 — ABNORMAL LOW (ref 1.005–1.030)
Urobilinogen, UA: 0.2 mg/dL (ref 0.0–1.0)
pH: 7 (ref 5.0–8.0)

## 2015-02-24 LAB — LIPID PANEL
Cholesterol: 198 mg/dL (ref 0–200)
HDL: 85 mg/dL (ref >=46)
LDL Cholesterol: 83 mg/dL (ref 0–99)
Total CHOL/HDL Ratio: 2.3 Ratio
Triglycerides: 150 mg/dL — ABNORMAL HIGH (ref <150)
VLDL: 30 mg/dL (ref 0–40)

## 2015-02-25 LAB — URINE CULTURE
Colony Count: NO GROWTH
Organism ID, Bacteria: NO GROWTH

## 2015-03-04 ENCOUNTER — Emergency Department (INDEPENDENT_AMBULATORY_CARE_PROVIDER_SITE_OTHER)
Admission: EM | Admit: 2015-03-04 | Discharge: 2015-03-04 | Disposition: A | Discharge status: Other Acute Inpatient Hospital | Payer: Commercial Managed Care - HMO | Source: Home / Self Care | Attending: Emergency Medicine | Admitting: Emergency Medicine

## 2015-03-04 ENCOUNTER — Ambulatory Visit: Payer: Commercial Managed Care - HMO | Admitting: Sports Medicine

## 2015-03-04 ENCOUNTER — Encounter: Payer: Self-pay | Admitting: Emergency Medicine

## 2015-03-04 DIAGNOSIS — M1990 Unspecified osteoarthritis, unspecified site: Secondary | ICD-10-CM | POA: Diagnosis not present

## 2015-03-04 DIAGNOSIS — J9601 Acute respiratory failure with hypoxia: Secondary | ICD-10-CM

## 2015-03-04 DIAGNOSIS — R0603 Acute respiratory distress: Secondary | ICD-10-CM

## 2015-03-04 DIAGNOSIS — J80 Acute respiratory distress syndrome: Secondary | ICD-10-CM

## 2015-03-04 DIAGNOSIS — R0789 Other chest pain: Secondary | ICD-10-CM | POA: Diagnosis not present

## 2015-03-04 DIAGNOSIS — Z886 Allergy status to analgesic agent status: Secondary | ICD-10-CM | POA: Diagnosis not present

## 2015-03-04 DIAGNOSIS — F419 Anxiety disorder, unspecified: Secondary | ICD-10-CM | POA: Diagnosis not present

## 2015-03-04 DIAGNOSIS — R0600 Dyspnea, unspecified: Secondary | ICD-10-CM

## 2015-03-04 DIAGNOSIS — Z881 Allergy status to other antibiotic agents status: Secondary | ICD-10-CM | POA: Diagnosis not present

## 2015-03-04 DIAGNOSIS — Z885 Allergy status to narcotic agent status: Secondary | ICD-10-CM | POA: Diagnosis not present

## 2015-03-04 DIAGNOSIS — R069 Unspecified abnormalities of breathing: Secondary | ICD-10-CM | POA: Diagnosis not present

## 2015-03-04 DIAGNOSIS — Z888 Allergy status to other drugs, medicaments and biological substances status: Secondary | ICD-10-CM | POA: Diagnosis not present

## 2015-03-04 DIAGNOSIS — R0602 Shortness of breath: Secondary | ICD-10-CM | POA: Diagnosis not present

## 2015-03-04 DIAGNOSIS — Z7982 Long term (current) use of aspirin: Secondary | ICD-10-CM | POA: Diagnosis not present

## 2015-03-04 DIAGNOSIS — J439 Emphysema, unspecified: Secondary | ICD-10-CM | POA: Diagnosis not present

## 2015-03-04 DIAGNOSIS — F17200 Nicotine dependence, unspecified, uncomplicated: Secondary | ICD-10-CM | POA: Diagnosis not present

## 2015-03-04 NOTE — ED Provider Notes (Addendum)
CSN: [id number]     Arrival date & time 03/04/15  1534 History   First MD Initiated Contact with Patient 03/04/15 [facility] Chief Complaint  Patient presents with  . Shortness of Breath    HPI Here with her partner. Acute severe shortness of breath past hour. Has not tried any particular treatment, partner brought her right here to urgent care.  She is not sure if having any chest pain. She is a smoker. Trying to quit. Has been chewing nicotine gum. Associated symptoms:  Felt lightheaded but no syncope. Denies focal weakness or numbness. No nausea or vomiting. Past Medical History  Diagnosis Date  . Fibromyalgia   . Depression   . Allergy   . GERD (gastroesophageal reflux disease)   . Hyperthyroidism 2010    sees Dr Marcello Moores, did RAI  . Colitis, ischemic   . Shingles   . Peripheral vascular disease   . Cancer     skin   Past Surgical History  Procedure Laterality Date  . Appendectomy  1939  . Tonsillectomy    . Breast biopsy  1954  . Abdominal hysterectomy  1975    for dub  . Hemorrhoid surgery  1969  . Orif ankle fracture  1979    left  . Carpal tunnel release  1990    right  . Cataract extraction  2009    right and left.   . Cervical fusion     Family History  Problem Relation Age of Onset  . Hypertension Sister   . Cancer Daughter   . Hyperlipidemia Daughter    History  Substance Use Topics  . Smoking status: Current Every Day Smoker -- 1.00 packs/day for 50 years    Types: Cigarettes  . Smokeless tobacco: Never Used     Comment: pt states that she has been trying to quit but can't seem to quit  . Alcohol Use: No   OB History    No data available     Review of Systems Remainder of Review of Systems negative for acute change except as noted in the HPI.  Allergies  Azithromycin; Celexa; Codeine; Crestor; Disalcid; Elavil; Erythromycin; Lexapro; Livalo; Lortab; Naproxen; Neosporin; Neosporin; Prozac; Talwin; Tetracyclines &  related; Thiamazole; and Wellbutrin  Home Medications   Prior to Admission medications   Medication Sig Start Date End Date Taking? Authorizing Provider  albuterol (PROVENTIL HFA;VENTOLIN HFA) 108 (90 BASE) MCG/ACT inhaler Inhale 2 puffs into the lungs every 6 (six) hours as needed for wheezing or shortness of breath. 03/19/14   Marcial Pacas, DO  ALPRAZolam (XANAX) 0.5 MG tablet Take 1 tablet (0.5 mg total) by mouth every 6 (six) hours as needed. 02/03/15   Marcial Pacas, DO  aspirin EC 81 MG tablet Take 1 tablet (81 mg total) by mouth daily. 01/03/13   Silverio Decamp, MD  B Complex Vitamins (B COMPLEX PO) Take by mouth.    Historical Provider, MD  cilostazol (PLETAL) 100 MG tablet Take 1 tablet by mouth two times a day 11/25/14   Marcial Pacas, DO  cyclobenzaprine (FLEXERIL) 10 MG tablet Take 1 tablet (10 mg total) by mouth 3 (three) times daily as needed for muscle spasms. 11/04/14   Marcial Pacas, DO  fish oil-omega-3 fatty acids 1000 MG capsule Take 2 g by mouth daily.    Historical Provider, MD  fluticasone (FLONASE) 50 MCG/ACT nasal spray Place 2 sprays into both nostrils daily. 04/17/14   Hali Marry, MD  HYDROcodone-acetaminophen (NORCO) 10-325 MG per tablet Take 1 tablet by mouth every 8 (eight) hours as needed. 02/23/15   Sean Hommel, DO  magnesium 30 MG tablet Take 30 mg by mouth 2 (two) times daily.    Historical Provider, MD  Omeprazole-Sodium Bicarbonate (ZEGERID) 20-1100 MG CAPS capsule Take 1 capsule by mouth daily before breakfast.    Historical Provider, MD  polyethylene glycol (MIRALAX / GLYCOLAX) packet Take 17 g by mouth daily.      Historical Provider, MD  pravastatin (PRAVACHOL) 80 MG tablet Take 1 tablet (80 mg total) by mouth daily. 12/02/14   Marcial Pacas, DO  predniSONE (DELTASONE) 20 MG tablet Three tabs daily days 1-3, two tabs daily days 4-6, one tab daily days 7-9, half tab daily days 10-13. 02/19/15 03/04/15  Marcial Pacas, DO  propylthiouracil (PTU) 50 MG tablet Take  50 mg by mouth daily.     Historical Provider, MD  venlafaxine XR (EFFEXOR XR) 75 MG 24 hr capsule Take 1 capsule (75 mg total) by mouth daily with breakfast. To help headache and mood. 11/25/14   Sean Hommel, DO   BP 111/70 mmHg  Pulse 87  Temp(Src) 98.2 F (36.8 C) (Oral)  Ht 5\' 3"  (1.6 m)  Wt 125 lb (56.7 kg)  BMI 22.15 kg/m2  SpO2 92% Physical Exam  Constitutional: She is oriented to person, place, and time. She appears well-developed and well-nourished. She appears distressed (Acutely short of breath, mild respiratory distress).  In acute respiratory distress. She was immediately taken back into exam room and evaluated by nurse and physician.  + Strong Odor of tobacco  HENT:  Head: Normocephalic and atraumatic.  Mouth/Throat: Oropharynx is clear and moist.  Eyes: Conjunctivae and EOM are normal. Pupils are equal, round, and reactive to light. No scleral icterus.  Neck: Normal range of motion. No JVD present.  Cardiovascular: Normal rate, regular rhythm and normal heart sounds.   Pulmonary/Chest: Accessory muscle usage present. She is in respiratory distress.  Diminished breath sounds bilaterally  Abdominal: She exhibits no distension.  Musculoskeletal: Normal range of motion.  Neurological: She is alert and oriented to person, place, and time.  Skin: Skin is warm.  Psychiatric: She has a normal mood and affect.  Nursing note and vitals reviewed.     Procedures (including critical care time) Labs Review Labs Reviewed - No data to display  Imaging Review No results found.   MDM Urgent care Course Initially patient acutely short of breath, felt tight in upper chest and airway. Pulse ox 92%.  Given O2 2 L/m, pulse ox improved to 93-94%. This  improved shortness of breath somewhat but patient stated that she felt tightness in throat and chest.  Vital signs remain stable.  EMS called 3:52 PM. Patient needs transport to hospital emergency room.    1. Acute respiratory  failure with hypoxia   2. Respiratory distress, acute   3. Dyspnea    EMS arrived 4:02 PM, and evaluated the patient briefly, then transported to hospital emergency room.    Jacqulyn Cane, MD 03/04/15 [place], MD 03/10/15 [phone]

## 2015-03-04 NOTE — ED Notes (Signed)
SOB

## 2015-03-08 ENCOUNTER — Telehealth: Payer: Self-pay | Admitting: Emergency Medicine

## 2015-03-15 ENCOUNTER — Ambulatory Visit (INDEPENDENT_AMBULATORY_CARE_PROVIDER_SITE_OTHER): Payer: Commercial Managed Care - HMO | Admitting: Sports Medicine

## 2015-03-15 ENCOUNTER — Encounter: Payer: Self-pay | Admitting: Sports Medicine

## 2015-03-15 DIAGNOSIS — M25551 Pain in right hip: Secondary | ICD-10-CM

## 2015-03-15 NOTE — Assessment & Plan Note (Signed)
Pain has been fairly migratory. Today it is predominantly referable to the lumbar spine with upper lumbar radiculopathy. There is no pain over the trochanteric bursa and no pain referable to the joint. Formal physical therapy, we will try home health PT first, if too expensive she will have to come here. Return in one month, MRI for intervention if no better.

## 2015-03-15 NOTE — Progress Notes (Signed)
  Subjective:    CC: Right hip pain  HPI: This is a pleasant 81 year old female, I have treated her for trochanteric bursitis in the past which has resolved, unfortunately she now has a recurrence of fairly diffuse, difficult to localize pain around her hip, buttock, thigh, and back. No discrete radiation, and no clear precipitating or palliating factors. No bowel or bladder dysfunction, saddle numbness, gelling or morning stiffness.  Peripheral arterial disease: On ABI it seemed predominantly to represent a popliteal obstruction, Pletal has helped significantly. Unfortunately she continues to have symptoms and I have asked her to follow-up with her vascular surgeon considering maximal medical intervention at this time.  Past medical history, Surgical history, Family history not pertinant except as noted below, Social history, Allergies, and medications have been entered into the medical record, reviewed, and no changes needed.   Review of Systems: No fevers, chills, night sweats, weight loss, chest pain, or shortness of breath.   Objective:    General: Well Developed, well nourished, and in no acute distress.  Neuro: Alert and oriented x3, extra-ocular muscles intact, sensation grossly intact.  HEENT: Normocephalic, atraumatic, pupils equal round reactive to light, neck supple, no masses, no lymphadenopathy, thyroid nonpalpable.  Skin: Warm and dry, no rashes. Cardiac: Regular rate and rhythm, no murmurs rubs or gallops, no lower extremity edema.  Respiratory: Clear to auscultation bilaterally. Not using accessory muscles, speaking in full sentences. Right Hip: ROM IR: 60 Deg, ER: 60 Deg, Flexion: 120 Deg, Extension: 100 Deg, Abduction: 45 Deg, Adduction: 45 Deg Strength IR: 5/5, ER: 5/5, Flexion: 5/5, Extension: 5/5, Abduction: 5/5, Adduction: 5/5 Pelvic alignment unremarkable to inspection and palpation. Standing hip rotation and gait without trendelenburg / unsteadiness. Greater  trochanter without tenderness to palpation. No tenderness over piriformis. No SI joint tenderness and normal minimal SI movement.  Impression and Recommendations:

## 2015-03-18 ENCOUNTER — Other Ambulatory Visit: Payer: Self-pay | Admitting: *Deleted

## 2015-03-18 ENCOUNTER — Telehealth: Payer: Self-pay | Admitting: *Deleted

## 2015-03-18 DIAGNOSIS — M25551 Pain in right hip: Secondary | ICD-10-CM

## 2015-03-18 MED ORDER — ALBUTEROL SULFATE HFA 108 (90 BASE) MCG/ACT IN AERS: 2.0000 | INHALATION_SPRAY | Freq: Four times a day (QID) | RESPIRATORY_TRACT | Status: DC | PRN

## 2015-03-18 NOTE — Telephone Encounter (Signed)
New referral placed with specifics of bedbound status.

## 2015-03-18 NOTE — Telephone Encounter (Signed)
A new order will to be sent for this pt to get PT done through home health. Was informed that the new order will need documentation of pt being home bound and documentation for PT, length of PT. Will fwd to Dr. Dianah Field for f/u.Audelia Hives [place]

## 2015-03-19 ENCOUNTER — Encounter: Payer: Self-pay | Admitting: Family Medicine

## 2015-03-19 ENCOUNTER — Ambulatory Visit (INDEPENDENT_AMBULATORY_CARE_PROVIDER_SITE_OTHER): Payer: Commercial Managed Care - HMO | Admitting: Family Medicine

## 2015-03-19 VITALS — BP 149/83 | HR 87 | Wt 129.0 lb

## 2015-03-19 DIAGNOSIS — J449 Chronic obstructive pulmonary disease, unspecified: Secondary | ICD-10-CM

## 2015-03-19 MED ORDER — BUDESONIDE-FORMOTEROL FUMARATE 80-4.5 MCG/ACT IN AERO: 2.0000 | INHALATION_SPRAY | Freq: Two times a day (BID) | RESPIRATORY_TRACT | Status: DC

## 2015-03-19 NOTE — Progress Notes (Signed)
CC: Heather Perkins is a 81 y.o. female is here for f/u sob   Subjective: HPI:  Last week while inside she had a sudden onset of shortness of breath and difficulty breathing due to what she felt was an obstruction in her throat. She had mild hypoxemia at our urgent care center and was transferred by EMS to a local emergency room. By the time she got there she tells me she was feeling somewhat better. She had an EKG which was unremarkable, CT angiogram of the chest which only showed known mild emphysema,blood work was overall unremarkable for her shortness of breath. She was not given any medications and actually felt back to her normal state of health after a few minutes of being in the emergency room. She states she had some wheezing at the time of the shortness of breath. She does not recall coming in contact with any irritant at the time of the shortness of breath. She denies chest pain around the time of the event nor since then. She states that she is back to her normal state of health and denies any cough, wheezing or shortness of breath. She is using albuterol every morning even if she is not having any respiratory symptoms. She never uses it at any other time during the day.   Review Of Systems Outlined In HPI  Past Medical History  Diagnosis Date  . Fibromyalgia   . Depression   . Allergy   . GERD (gastroesophageal reflux disease)   . Hyperthyroidism 2010    sees Dr Marcello Moores, did RAI  . Colitis, ischemic   . Shingles   . Peripheral vascular disease   . Cancer     skin    Past Surgical History  Procedure Laterality Date  . Appendectomy  1939  . Tonsillectomy    . Breast biopsy  1954  . Abdominal hysterectomy  1975    for dub  . Hemorrhoid surgery  1969  . Orif ankle fracture  1979    left  . Carpal tunnel release  1990    right  . Cataract extraction  2009    right and left.   . Cervical fusion     Family History  Problem Relation Age of Onset  . Hypertension Sister    . Cancer Daughter   . Hyperlipidemia Daughter     History   Social History  . Marital Status: Divorced    Spouse Name: N/A  . Number of Children: N/A  . Years of Education: N/A   Occupational History  . Not on file.   Social History Main Topics  . Smoking status: Current Every Day Smoker -- 1.00 packs/day for 50 years    Types: Cigarettes  . Smokeless tobacco: Never Used     Comment: pt states that she has been trying to quit but can't seem to quit  . Alcohol Use: No  . Drug Use: No  . Sexual Activity: Not on file   Other Topics Concern  . Not on file   Social History Narrative     Objective: BP 149/83 mmHg  Pulse 87  Wt 129 lb (58.514 kg)  SpO2 95%  General: Alert and Oriented, No Acute Distress HEENT: Pupils equal, round, reactive to light. Conjunctivae clear.  Moist mucous membranes. Pharynx unremarkable Lungs: Clear to auscultation bilaterally, no wheezing/ronchi/rales.  Comfortable work of breathing. Good air movement. Cardiac: Regular rate and rhythm. Normal S1/S2.  No murmurs, rubs, nor gallops.   Extremities: No  peripheral edema.  Strong peripheral pulses.  Mental Status: No depression, anxiety, nor agitation. Skin: Warm and dry.  Assessment & Plan: Heather Perkins was seen today for f/u sob.  Diagnoses and all orders for this visit:  Chronic obstructive pulmonary disease, unspecified COPD, unspecified chronic bronchitis type  Other orders -     budesonide-formoterol (SYMBICORT) 80-4.5 MCG/ACT inhaler; Inhale 2 puffs into the lungs 2 (two) times daily. COPD with what sounds to be an acute bronchospasm therefore I've recommended she start taking Symbicort on a daily basis to help prevent further events and also to help preserve lung function in general.  25 minutes spent face-to-face during visit today of which at least 50% was counseling or coordinating care regarding: 1. Chronic obstructive pulmonary disease, unspecified COPD, unspecified chronic bronchitis type    And reviewing hospital records and the presence of the patient    Return if symptoms worsen or fail to improve.

## 2015-03-26 ENCOUNTER — Other Ambulatory Visit: Payer: Self-pay | Admitting: Family Medicine

## 2015-03-26 DIAGNOSIS — R51 Headache: Principal | ICD-10-CM

## 2015-03-26 DIAGNOSIS — R519 Headache, unspecified: Secondary | ICD-10-CM

## 2015-03-26 MED ORDER — VENLAFAXINE HCL ER 75 MG PO CP24: 75.0000 mg | ORAL_CAPSULE | Freq: Every day | ORAL | Status: DC

## 2015-04-13 ENCOUNTER — Telehealth: Payer: Self-pay | Admitting: Sports Medicine

## 2015-04-13 ENCOUNTER — Encounter: Payer: Self-pay | Admitting: Sports Medicine

## 2015-04-13 ENCOUNTER — Ambulatory Visit (INDEPENDENT_AMBULATORY_CARE_PROVIDER_SITE_OTHER): Payer: Commercial Managed Care - HMO | Admitting: Sports Medicine

## 2015-04-13 DIAGNOSIS — M25551 Pain in right hip: Secondary | ICD-10-CM | POA: Diagnosis not present

## 2015-04-13 MED ORDER — MELOXICAM 15 MG PO TABS: ORAL_TABLET | ORAL | Status: DC

## 2015-04-13 NOTE — Telephone Encounter (Signed)
Yes I know, she is amenable to do it now.

## 2015-04-13 NOTE — Assessment & Plan Note (Signed)
Pain is migratory, on and off. Today is referable to the lumbar spine with upper lumbar radiculopathy. No pain referable to the joint or the trochanteric bursa. She did have a single session of physical therapy, not much was done, this was home health. I would like to reorder this, however they will need to work with her regularly whether she's hurting or not. I'm also going to switch her from ibuprofen to meloxicam with Vicodin only for breakthrough pain. Return in one month, MRI for interventional injection planning if no better.

## 2015-04-13 NOTE — Progress Notes (Signed)
  Subjective:    CC: Follow-up  HPI: This pleasant 81 year old female returns, she was having migratory pain that I suspected was referable from the lumbar spine with upper lumbar radiculopathy. Unfortunately she has really not had any home health physical therapy yet. She does do a Vicodin, and an occasional ibuprofen for breakthrough pain, she did not realize this is opposite of what she should be doing. Symptoms are mild, she has no pain today.  Past medical history, Surgical history, Family history not pertinant except as noted below, Social history, Allergies, and medications have been entered into the medical record, reviewed, and no changes needed.   Review of Systems: No fevers, chills, night sweats, weight loss, chest pain, or shortness of breath.   Objective:    General: Well Developed, well nourished, and in no acute distress.  Neuro: Alert and oriented x3, extra-ocular muscles intact, sensation grossly intact.  HEENT: Normocephalic, atraumatic, pupils equal round reactive to light, neck supple, no masses, no lymphadenopathy, thyroid nonpalpable.  Skin: Warm and dry, no rashes. Cardiac: Regular rate and rhythm, no murmurs rubs or gallops, no lower extremity edema.  Respiratory: Clear to auscultation bilaterally. Not using accessory muscles, speaking in full sentences.  Impression and Recommendations:

## 2015-04-13 NOTE — Telephone Encounter (Signed)
Dr. Darene Lamer   Ms Tamura told them she did not want PT because the pain in her hip is on and off also her insurance does not want to pay for the service.   Heather Perkins

## 2015-04-13 NOTE — Telephone Encounter (Signed)
-----   Message from Silverio Decamp, MD sent at 04/13/2015  3:19 PM EDT ----- Rexene Edison, This lady got a single session of home health PT but they never really came out after.  She needs more sessions but they need to work with her whether hurting of not. Thanks -T

## 2015-04-14 NOTE — Telephone Encounter (Signed)
I sent the information to [facility] again and they will schedule with Ms. Heyde for good appointment times. - CF

## 2015-04-19 DIAGNOSIS — M25551 Pain in right hip: Secondary | ICD-10-CM | POA: Diagnosis not present

## 2015-04-19 DIAGNOSIS — K2970 Gastritis, unspecified, without bleeding: Secondary | ICD-10-CM | POA: Diagnosis not present

## 2015-04-19 DIAGNOSIS — M47896 Other spondylosis, lumbar region: Secondary | ICD-10-CM | POA: Diagnosis not present

## 2015-04-19 DIAGNOSIS — M71551 Other bursitis, not elsewhere classified, right hip: Secondary | ICD-10-CM | POA: Diagnosis not present

## 2015-04-24 DIAGNOSIS — K2970 Gastritis, unspecified, without bleeding: Secondary | ICD-10-CM | POA: Diagnosis not present

## 2015-04-24 DIAGNOSIS — M25551 Pain in right hip: Secondary | ICD-10-CM | POA: Diagnosis not present

## 2015-04-24 DIAGNOSIS — M71551 Other bursitis, not elsewhere classified, right hip: Secondary | ICD-10-CM | POA: Diagnosis not present

## 2015-04-24 DIAGNOSIS — M47896 Other spondylosis, lumbar region: Secondary | ICD-10-CM | POA: Diagnosis not present

## 2015-04-30 ENCOUNTER — Other Ambulatory Visit: Payer: Self-pay | Admitting: Family Medicine

## 2015-04-30 DIAGNOSIS — M47896 Other spondylosis, lumbar region: Secondary | ICD-10-CM | POA: Diagnosis not present

## 2015-04-30 DIAGNOSIS — M71551 Other bursitis, not elsewhere classified, right hip: Secondary | ICD-10-CM | POA: Diagnosis not present

## 2015-04-30 DIAGNOSIS — I739 Peripheral vascular disease, unspecified: Secondary | ICD-10-CM

## 2015-04-30 DIAGNOSIS — M25551 Pain in right hip: Secondary | ICD-10-CM | POA: Diagnosis not present

## 2015-04-30 DIAGNOSIS — K2970 Gastritis, unspecified, without bleeding: Secondary | ICD-10-CM | POA: Diagnosis not present

## 2015-04-30 MED ORDER — CILOSTAZOL 100 MG PO TABS: ORAL_TABLET | ORAL | Status: DC

## 2015-05-01 DIAGNOSIS — M25551 Pain in right hip: Secondary | ICD-10-CM | POA: Diagnosis not present

## 2015-05-01 DIAGNOSIS — M47896 Other spondylosis, lumbar region: Secondary | ICD-10-CM | POA: Diagnosis not present

## 2015-05-01 DIAGNOSIS — K2970 Gastritis, unspecified, without bleeding: Secondary | ICD-10-CM | POA: Diagnosis not present

## 2015-05-01 DIAGNOSIS — M71551 Other bursitis, not elsewhere classified, right hip: Secondary | ICD-10-CM | POA: Diagnosis not present

## 2015-05-07 DIAGNOSIS — M71551 Other bursitis, not elsewhere classified, right hip: Secondary | ICD-10-CM | POA: Diagnosis not present

## 2015-05-07 DIAGNOSIS — M47896 Other spondylosis, lumbar region: Secondary | ICD-10-CM | POA: Diagnosis not present

## 2015-05-07 DIAGNOSIS — M25551 Pain in right hip: Secondary | ICD-10-CM | POA: Diagnosis not present

## 2015-05-07 DIAGNOSIS — K2970 Gastritis, unspecified, without bleeding: Secondary | ICD-10-CM | POA: Diagnosis not present

## 2015-05-08 DIAGNOSIS — M47896 Other spondylosis, lumbar region: Secondary | ICD-10-CM | POA: Diagnosis not present

## 2015-05-08 DIAGNOSIS — K2970 Gastritis, unspecified, without bleeding: Secondary | ICD-10-CM | POA: Diagnosis not present

## 2015-05-08 DIAGNOSIS — M71551 Other bursitis, not elsewhere classified, right hip: Secondary | ICD-10-CM | POA: Diagnosis not present

## 2015-05-08 DIAGNOSIS — M25551 Pain in right hip: Secondary | ICD-10-CM | POA: Diagnosis not present

## 2015-05-11 ENCOUNTER — Ambulatory Visit (INDEPENDENT_AMBULATORY_CARE_PROVIDER_SITE_OTHER): Payer: Commercial Managed Care - HMO | Admitting: Sports Medicine

## 2015-05-11 ENCOUNTER — Encounter: Payer: Self-pay | Admitting: Sports Medicine

## 2015-05-11 ENCOUNTER — Ambulatory Visit: Payer: Commercial Managed Care - HMO | Admitting: Sports Medicine

## 2015-05-11 VITALS — BP 111/64 | HR 92 | Ht 63.0 in | Wt 130.0 lb

## 2015-05-11 DIAGNOSIS — M25551 Pain in right hip: Secondary | ICD-10-CM

## 2015-05-11 NOTE — Progress Notes (Signed)
  Subjective:    CC: Follow-up  HPI: This relatively pleasant 81 year old female returns to go over multifactorial right and left hip pain, she does have osteoarthritis, an element of trochanteric bursitis, as well as some lumbar radiculitis, she has had a few sessions of home health physical therapy, and tells me her pain is now resolved. I have asked her to continue PT for a full 6 weeks to prevent this from returning.  Past medical history, Surgical history, Family history not pertinant except as noted below, Social history, Allergies, and medications have been entered into the medical record, reviewed, and no changes needed.   Review of Systems: No fevers, chills, night sweats, weight loss, chest pain, or shortness of breath.   Objective:    General: Well Developed, well nourished, and in no acute distress.  Neuro: Alert and oriented x3, extra-ocular muscles intact, sensation grossly intact.  HEENT: Normocephalic, atraumatic, pupils equal round reactive to light, neck supple, no masses, no lymphadenopathy, thyroid nonpalpable.  Skin: Warm and dry, no rashes. Cardiac: Regular rate and rhythm, no murmurs rubs or gallops, no lower extremity edema.  Respiratory: Clear to auscultation bilaterally. Not using accessory muscles, speaking in full sentences. Bilateral hips: ROM IR: 60 Deg, ER: 60 Deg, Flexion: 120 Deg, Extension: 100 Deg, Abduction: 45 Deg, Adduction: 45 Deg Strength IR: 5/5, ER: 5/5, Flexion: 5/5, Extension: 5/5, Abduction: 5/5, Adduction: 5/5 Pelvic alignment unremarkable to inspection and palpation. Standing hip rotation and gait without trendelenburg / unsteadiness. Greater trochanter without tenderness to palpation. No tenderness over piriformis. No SI joint tenderness and normal minimal SI movement.  Impression and Recommendations:    I spent 25 minutes with this patient, greater than 50% was face-to-face time counseling regarding the above diagnoses

## 2015-05-11 NOTE — Assessment & Plan Note (Signed)
Multifactorial and related to lumbar radiculopathy, hip arthritis and trochanteric bursitis, resolved with a few sessions of home health physical therapy, I would like her to do a few more sessions, return as needed

## 2015-05-14 DIAGNOSIS — M47896 Other spondylosis, lumbar region: Secondary | ICD-10-CM | POA: Diagnosis not present

## 2015-05-14 DIAGNOSIS — K2970 Gastritis, unspecified, without bleeding: Secondary | ICD-10-CM | POA: Diagnosis not present

## 2015-05-14 DIAGNOSIS — M25551 Pain in right hip: Secondary | ICD-10-CM | POA: Diagnosis not present

## 2015-05-14 DIAGNOSIS — M71551 Other bursitis, not elsewhere classified, right hip: Secondary | ICD-10-CM | POA: Diagnosis not present

## 2015-05-26 DIAGNOSIS — E049 Nontoxic goiter, unspecified: Secondary | ICD-10-CM | POA: Diagnosis not present

## 2015-05-26 DIAGNOSIS — E0590 Thyrotoxicosis, unspecified without thyrotoxic crisis or storm: Secondary | ICD-10-CM | POA: Diagnosis not present

## 2015-05-28 ENCOUNTER — Telehealth: Payer: Self-pay | Admitting: Family Medicine

## 2015-05-28 MED ORDER — ALPRAZOLAM 0.5 MG PO TABS: 0.5000 mg | ORAL_TABLET | Freq: Four times a day (QID) | ORAL | Status: DC | PRN

## 2015-05-28 NOTE — Telephone Encounter (Signed)
Refill request

## 2015-06-23 ENCOUNTER — Telehealth: Payer: Self-pay

## 2015-06-23 DIAGNOSIS — M5030 Other cervical disc degeneration, unspecified cervical region: Secondary | ICD-10-CM

## 2015-06-23 MED ORDER — HYDROCODONE-ACETAMINOPHEN 10-325 MG PO TABS: 1.0000 | ORAL_TABLET | Freq: Three times a day (TID) | ORAL | Status: DC | PRN

## 2015-06-23 NOTE — Telephone Encounter (Signed)
[facility], Rx placed in in-box ready for pickup/faxing.  

## 2015-06-23 NOTE — Telephone Encounter (Signed)
Patient called for a refill on her Hydrocodone. She left a message stating she will be in town tomorrow.

## 2015-06-24 NOTE — Telephone Encounter (Signed)
rx up front 

## 2015-06-28 ENCOUNTER — Other Ambulatory Visit: Payer: Self-pay | Admitting: *Deleted

## 2015-06-28 MED ORDER — PRAVASTATIN SODIUM 80 MG PO TABS: 80.0000 mg | ORAL_TABLET | Freq: Every day | ORAL | Status: DC

## 2015-07-08 ENCOUNTER — Other Ambulatory Visit: Payer: Self-pay | Admitting: Family Medicine

## 2015-07-08 MED ORDER — FLUTICASONE PROPIONATE 50 MCG/ACT NA SUSP: 2.0000 | Freq: Every day | NASAL | Status: DC

## 2015-07-30 ENCOUNTER — Telehealth: Payer: Self-pay | Admitting: Family Medicine

## 2015-07-30 DIAGNOSIS — E0590 Thyrotoxicosis, unspecified without thyrotoxic crisis or storm: Secondary | ICD-10-CM

## 2015-07-30 NOTE — Telephone Encounter (Signed)
Pt called office stating she needs a new referral to Endocrinology for Hyperthyroidism. She has been seeing Dr. Raul Del and would like to continue her care there.

## 2015-07-30 NOTE — Telephone Encounter (Signed)
Referral placed up front

## 2015-08-03 ENCOUNTER — Other Ambulatory Visit: Payer: Self-pay | Admitting: Family Medicine

## 2015-08-03 DIAGNOSIS — M5030 Other cervical disc degeneration, unspecified cervical region: Secondary | ICD-10-CM

## 2015-08-04 MED ORDER — CYCLOBENZAPRINE HCL 10 MG PO TABS: 10.0000 mg | ORAL_TABLET | Freq: Three times a day (TID) | ORAL | Status: DC | PRN

## 2015-08-05 ENCOUNTER — Other Ambulatory Visit: Payer: Self-pay

## 2015-08-05 DIAGNOSIS — R519 Headache, unspecified: Secondary | ICD-10-CM

## 2015-08-05 DIAGNOSIS — R51 Headache: Principal | ICD-10-CM

## 2015-08-05 MED ORDER — ALPRAZOLAM 0.5 MG PO TABS: 0.5000 mg | ORAL_TABLET | Freq: Four times a day (QID) | ORAL | Status: DC | PRN

## 2015-08-05 MED ORDER — VENLAFAXINE HCL ER 75 MG PO CP24: 75.0000 mg | ORAL_CAPSULE | Freq: Every day | ORAL | Status: DC

## 2015-08-05 NOTE — Telephone Encounter (Signed)
Pt has not has an visit since June.

## 2015-08-05 NOTE — Telephone Encounter (Signed)
[place], Rx placed in in-box ready for pickup/faxing.  

## 2015-08-11 ENCOUNTER — Other Ambulatory Visit: Payer: Self-pay

## 2015-08-11 ENCOUNTER — Encounter: Payer: Self-pay | Admitting: Family Medicine

## 2015-08-11 ENCOUNTER — Ambulatory Visit (INDEPENDENT_AMBULATORY_CARE_PROVIDER_SITE_OTHER): Payer: Commercial Managed Care - HMO | Admitting: Family Medicine

## 2015-08-11 VITALS — BP 169/84 | HR 68 | Wt 131.0 lb

## 2015-08-11 DIAGNOSIS — R4182 Altered mental status, unspecified: Secondary | ICD-10-CM

## 2015-08-11 DIAGNOSIS — J189 Pneumonia, unspecified organism: Secondary | ICD-10-CM

## 2015-08-11 DIAGNOSIS — M5030 Other cervical disc degeneration, unspecified cervical region: Secondary | ICD-10-CM

## 2015-08-11 MED ORDER — LEVOFLOXACIN 500 MG PO TABS: 750.0000 mg | ORAL_TABLET | Freq: Every day | ORAL | Status: DC

## 2015-08-11 NOTE — Telephone Encounter (Signed)
Left a message for patient to call back and schedule an appointment. Could she have a thirty day supply?

## 2015-08-11 NOTE — Progress Notes (Signed)
CC: Heather Perkins is a 81 y.o. female is here for Altered Mental Status; Dizziness; Headache; and Cough   Subjective: HPI:  Complains of a cough that is much more productive than what she is used to. This is been accompanied by fatigue for the past week. Cough is present all hours today and interfering with sleep. She tells me that she is getting waxing and waning myalgias. She tells me that she has an element of confusion where she is forgetful of things that she was just doing. She's also been feeling what she describes as bladder spasms in her lower pelvis. Symptoms came on abruptly and had not been getting better or worse since onset. She tells me it hurts a little bit to breathe behind the sternum but has had no exertional chest pain. She's lost her appetite this morning , threw up yesterday but has been tolerating Gatorade today. She denies any facial pain, hallucinations, falls, joint pain, rash, diarrhea, constipation or abdominal pain.   Review Of Systems Outlined In HPI  Past Medical History  Diagnosis Date  . Fibromyalgia   . Depression   . Allergy   . GERD (gastroesophageal reflux disease)   . Hyperthyroidism 2010    sees Dr Marcello Moores, did RAI  . Colitis, ischemic ([place])   . Shingles   . Peripheral vascular disease ([place])   . Cancer [facility])     skin    Past Surgical History  Procedure Laterality Date  . Appendectomy  1939  . Tonsillectomy    . Breast biopsy  1954  . Abdominal hysterectomy  1975    for dub  . Hemorrhoid surgery  1969  . Orif ankle fracture  1979    left  . Carpal tunnel release  1990    right  . Cataract extraction  2009    right and left.   . Cervical fusion     Family History  Problem Relation Age of Onset  . Hypertension Sister   . Cancer Daughter   . Hyperlipidemia Daughter     Social History   Social History  . Marital Status: Divorced    Spouse Name: N/A  . Number of Children: N/A  . Years of Education: N/A   Occupational History  .  Not on file.   Social History Main Topics  . Smoking status: Current Every Day Smoker -- 1.00 packs/day for 50 years    Types: Cigarettes  . Smokeless tobacco: Never Used     Comment: pt states that she has been trying to quit but can't seem to quit  . Alcohol Use: No  . Drug Use: No  . Sexual Activity: Not on file   Other Topics Concern  . Not on file   Social History Narrative     Objective: BP 169/84 mmHg  Pulse 68  Wt 131 lb (59.421 kg)  General: Alert and Oriented, No Acute Distress but she does appear more fatigued compared to prior visits. HEENT: Pupils equal, round, reactive to light. Conjunctivae clear.  External ears unremarkable, canals clear with intact TMs with appropriate landmarks.  Middle ear appears open without effusion. Pink inferior turbinates.  Moist mucous membranes, pharynx without inflammation nor lesions.  Neck supple without palpable lymphadenopathy nor abnormal masses. Lungs: comfortable work of breathing with distant breath sounds and rales in the right posterior inferior lung fields Cardiac: Regular rate and rhythm. Normal S1/S2.  No murmurs, rubs, nor gallops.   Abdomen: soft nontender Extremities: No peripheral edema.  Strong peripheral pulses.  Mental Status: No depression, anxiety, nor agitation. Skin: Warm and dry.  Assessment & Plan: [name] was seen today for altered mental status, dizziness, headache and cough.  Diagnoses and all orders for this visit:  CAP (community acquired pneumonia)  Altered mental status, unspecified altered mental status type -     Urinalysis, Routine w reflex microscopic -     Urine culture -     COMPLETE METABOLIC PANEL WITH GFR -     CBC  Other orders -     levofloxacin (LEVAQUIN) 500 MG tablet; Take 1.5 tablets (750 mg total) by mouth daily.   Exam suggestive of community acquired pneumonia therefore start levofloxacin. I will to rule out that there is not some other disease process present given that she  doesn't seem nearly as happy as she usually does in the past.   Return if symptoms worsen or fail to improve.

## 2015-08-12 ENCOUNTER — Other Ambulatory Visit: Payer: Self-pay

## 2015-08-12 DIAGNOSIS — I739 Peripheral vascular disease, unspecified: Secondary | ICD-10-CM

## 2015-08-12 LAB — CBC
HCT: 45.1 % (ref 36.0–46.0)
Hemoglobin: 14.8 g/dL (ref 12.0–15.0)
MCH: 29.6 pg (ref 26.0–34.0)
MCHC: 32.8 g/dL (ref 30.0–36.0)
MCV: 90.2 fL (ref 78.0–100.0)
MPV: 11.2 fL (ref 8.6–12.4)
Platelets: 278 10*3/uL (ref 150–400)
RBC: 5 MIL/uL (ref 3.87–5.11)
RDW: 13.9 % (ref 11.5–15.5)
WBC: 6.8 10*3/uL (ref 4.0–10.5)

## 2015-08-12 LAB — COMPLETE METABOLIC PANEL WITH GFR
ALT: 15 U/L (ref 6–29)
AST: 18 U/L (ref 10–35)
Albumin: 4.2 g/dL (ref 3.6–5.1)
Alkaline Phosphatase: 57 U/L (ref 33–130)
BUN: 8 mg/dL (ref 7–25)
CO2: 24 mmol/L (ref 20–31)
Calcium: 9.1 mg/dL (ref 8.6–10.4)
Chloride: 103 mmol/L (ref 98–110)
Creat: 0.68 mg/dL (ref 0.60–0.93)
GFR, Est African American: >89 mL/min (ref >=60)
GFR, Est Non African American: 83 mL/min (ref >=60)
Glucose, Bld: 100 mg/dL — ABNORMAL HIGH (ref 65–99)
Potassium: 4.1 mmol/L (ref 3.5–5.3)
Sodium: 137 mmol/L (ref 135–146)
Total Bilirubin: 0.6 mg/dL (ref 0.2–1.2)
Total Protein: 6.8 g/dL (ref 6.1–8.1)

## 2015-08-12 LAB — URINALYSIS, ROUTINE W REFLEX MICROSCOPIC
Bilirubin Urine: NEGATIVE
Glucose, UA: NEGATIVE
Hgb urine dipstick: NEGATIVE
Ketones, ur: NEGATIVE
Leukocytes, UA: NEGATIVE
Nitrite: NEGATIVE
Protein, ur: NEGATIVE
Specific Gravity, Urine: 1.004 (ref 1.001–1.035)
pH: 7 (ref 5.0–8.0)

## 2015-08-12 MED ORDER — HYDROCODONE-ACETAMINOPHEN 10-325 MG PO TABS: 1.0000 | ORAL_TABLET | Freq: Three times a day (TID) | ORAL | Status: DC | PRN

## 2015-08-12 MED ORDER — CILOSTAZOL 100 MG PO TABS: ORAL_TABLET | ORAL | Status: DC

## 2015-08-12 NOTE — Telephone Encounter (Signed)
[place], Rx placed in in-box ready for pickup/faxing.  

## 2015-08-13 LAB — URINE CULTURE
Colony Count: NO GROWTH
Organism ID, Bacteria: NO GROWTH

## 2015-08-18 ENCOUNTER — Other Ambulatory Visit: Payer: Self-pay

## 2015-08-18 NOTE — Telephone Encounter (Signed)
This was just refilled around the 27th of Oct.

## 2015-08-18 NOTE — Telephone Encounter (Signed)
Denied, too early, [place] database shows this was filled yesterday.

## 2015-08-25 ENCOUNTER — Ambulatory Visit (INDEPENDENT_AMBULATORY_CARE_PROVIDER_SITE_OTHER): Payer: Commercial Managed Care - HMO | Admitting: Family Medicine

## 2015-08-25 ENCOUNTER — Encounter: Payer: Self-pay | Admitting: Family Medicine

## 2015-08-25 VITALS — BP 106/63 | HR 86 | Temp 98.0°F | Wt 133.0 lb

## 2015-08-25 DIAGNOSIS — J189 Pneumonia, unspecified organism: Secondary | ICD-10-CM

## 2015-08-25 NOTE — Progress Notes (Signed)
CC: Heather Perkins is a 81 y.o. female is here for Follow-up and Fatigue   Subjective: HPI:  Patient reports about 5 days after starting Levaquin she began to notice improvement with her cough, she regained energy, she regained her appetite and she overall started feeling better. She believes her cough is back to its baseline daily smoker's cough and she is no longer having shortness of breath. Her daughter has also noticed that she is back to her normal self with respect to energy. Patient's only complaint is that she still feels like she is somewhat drained and feels like she should have more energy. She denies fevers, chills, wheezing, nor chest pain.   Review Of Systems Outlined In HPI  Past Medical History  Diagnosis Date  . Fibromyalgia   . Depression   . Allergy   . GERD (gastroesophageal reflux disease)   . Hyperthyroidism 2010    sees Dr Heather Perkins, did RAI  . Colitis, ischemic ([place])   . Shingles   . Peripheral vascular disease ([place])   . Cancer [facility])     skin    Past Surgical History  Procedure Laterality Date  . Appendectomy  1939  . Tonsillectomy    . Breast biopsy  1954  . Abdominal hysterectomy  1975    for dub  . Hemorrhoid surgery  1969  . Orif ankle fracture  1979    left  . Carpal tunnel release  1990    right  . Cataract extraction  2009    right and left.   . Cervical fusion     Family History  Problem Relation Age of Onset  . Hypertension Sister   . Cancer Daughter   . Hyperlipidemia Daughter     Social History   Social History  . Marital Status: Divorced    Spouse Name: N/A  . Number of Children: N/A  . Years of Education: N/A   Occupational History  . Not on file.   Social History Main Topics  . Smoking status: Current Every Day Smoker -- 1.00 packs/day for 50 years    Types: Cigarettes  . Smokeless tobacco: Never Used     Comment: pt states that she has been trying to quit but can't seem to quit  . Alcohol Use: No  . Drug Use: No  .  Sexual Activity: Not on file   Other Topics Concern  . Not on file   Social History Narrative     Objective: BP 106/63 mmHg  Pulse 86  Temp(Src) 98 F (36.7 C) (Oral)  Wt 133 lb (60.328 kg)  General: Alert and Oriented, No Acute Distress HEENT: Pupils equal, round, reactive to light. Conjunctivae clear.  Moist mucous membranes. Pharynx unremarkable Lungs: Clear to auscultation bilaterally, no wheezing/ronchi/rales.  Comfortable work of breathing. Good air movement. Cardiac: Regular rate and rhythm. Normal S1/S2.  No murmurs, rubs, nor gallops.   Extremities: No peripheral edema.  Strong peripheral pulses.  Mental Status: No depression, anxiety, nor agitation. Skin: Warm and dry.  Assessment & Plan: Heather Perkins was seen today for follow-up and fatigue.  Diagnoses and all orders for this visit:  CAP (community acquired pneumonia)  Community acquired pneumonia: Substantial improvement, discussed that it may take up until next week before she feels like she gets all of her energy back, she does seem much more like herself today though however with being quickwitted and joking. Try to stay hydrated and well fed to help recovery process  Return if symptoms worsen  or fail to improve.

## 2015-09-07 ENCOUNTER — Other Ambulatory Visit: Payer: Self-pay

## 2015-09-07 DIAGNOSIS — R519 Headache, unspecified: Secondary | ICD-10-CM

## 2015-09-07 DIAGNOSIS — R51 Headache: Principal | ICD-10-CM

## 2015-09-07 MED ORDER — VENLAFAXINE HCL ER 75 MG PO CP24: 75.0000 mg | ORAL_CAPSULE | Freq: Every day | ORAL | Status: DC

## 2015-09-16 ENCOUNTER — Other Ambulatory Visit: Payer: Self-pay

## 2015-09-16 MED ORDER — FLUTICASONE PROPIONATE 50 MCG/ACT NA SUSP: 2.0000 | Freq: Every day | NASAL | Status: DC

## 2015-10-21 ENCOUNTER — Telehealth: Payer: Self-pay

## 2015-10-21 DIAGNOSIS — M5030 Other cervical disc degeneration, unspecified cervical region: Secondary | ICD-10-CM

## 2015-10-21 MED ORDER — HYDROCODONE-ACETAMINOPHEN 10-325 MG PO TABS: 1.0000 | ORAL_TABLET | Freq: Three times a day (TID) | ORAL | Status: DC | PRN

## 2015-10-21 NOTE — Telephone Encounter (Signed)
Refill request filled.

## 2015-11-08 ENCOUNTER — Telehealth: Payer: Self-pay

## 2015-11-08 MED ORDER — ALPRAZOLAM 0.5 MG PO TABS: 0.5000 mg | ORAL_TABLET | Freq: Four times a day (QID) | ORAL | Status: DC | PRN

## 2015-11-08 NOTE — Telephone Encounter (Signed)
Refill sent to pharmacy.   

## 2015-11-22 ENCOUNTER — Ambulatory Visit (INDEPENDENT_AMBULATORY_CARE_PROVIDER_SITE_OTHER): Payer: Commercial Managed Care - HMO | Admitting: Family Medicine

## 2015-11-22 ENCOUNTER — Encounter: Payer: Self-pay | Admitting: Family Medicine

## 2015-11-22 VITALS — BP 135/74 | HR 65 | Wt 135.0 lb

## 2015-11-22 DIAGNOSIS — E0590 Thyrotoxicosis, unspecified without thyrotoxic crisis or storm: Secondary | ICD-10-CM

## 2015-11-22 DIAGNOSIS — J449 Chronic obstructive pulmonary disease, unspecified: Secondary | ICD-10-CM

## 2015-11-22 DIAGNOSIS — I739 Peripheral vascular disease, unspecified: Secondary | ICD-10-CM

## 2015-11-22 DIAGNOSIS — M5030 Other cervical disc degeneration, unspecified cervical region: Secondary | ICD-10-CM

## 2015-11-22 DIAGNOSIS — R300 Dysuria: Secondary | ICD-10-CM

## 2015-11-22 MED ORDER — HYDROCODONE-ACETAMINOPHEN 10-325 MG PO TABS: 1.0000 | ORAL_TABLET | Freq: Three times a day (TID) | ORAL | Status: DC | PRN

## 2015-11-22 MED ORDER — CILOSTAZOL 100 MG PO TABS: ORAL_TABLET | ORAL | Status: DC

## 2015-11-22 MED ORDER — TIOTROPIUM BROMIDE MONOHYDRATE 2.5 MCG/ACT IN AERS: INHALATION_SPRAY | RESPIRATORY_TRACT | Status: DC

## 2015-11-22 NOTE — Progress Notes (Signed)
CC: Heather Perkins Perkins is a 82 y.o. female is here for URI and Medication Refill   Subjective: HPI:  Since I saw her last she still has a productive cough occurring most days out of the week. Slightly improved with taking albuterol. This only results for a few hours and then comes right back. It is moderate in severity. She's also noticed increased sputum production. No other interventions other than above and an antibiotic that I gave her in November. She denies any wheezing or chest pain. She denies any confusion or facial pressure.  She like a refill on Pletal for that she takes this on a daily basis she denies any limb claudication.  She would like a referral to urologist in the current bursal area since she can no longer make it out to [place] on her own.  She would also like a new referral for insurance purposes to her endocrinologist.  She would like refills on hydrocodone. She tells me she is not due for refill quite yet but will be due in 1 or 2 weeks and wants to know if she can pick it up today due to the inconvenience of transportation issues. She denies any new component to her chronic low back pain. She denies any radicular pain nor any new motor or sensory disturbances.   Review Of Systems Outlined In HPI  Past Medical History  Diagnosis Date  . Fibromyalgia   . Depression   . Allergy   . GERD (gastroesophageal reflux disease)   . Hyperthyroidism 2010    sees Dr Heather Perkins Perkins, did RAI  . Colitis, ischemic ([place])   . Shingles   . Peripheral vascular disease ([place])   . Cancer [facility])     skin    Past Surgical History  Procedure Laterality Date  . Appendectomy  1939  . Tonsillectomy    . Breast biopsy  1954  . Abdominal hysterectomy  1975    for dub  . Hemorrhoid surgery  1969  . Orif ankle fracture  1979    left  . Carpal tunnel release  1990    right  . Cataract extraction  2009    right and left.   . Cervical fusion     Family History  Problem Relation Age of  Onset  . Hypertension Sister   . Cancer Daughter   . Hyperlipidemia Daughter     Social History   Social History  . Marital Status: Divorced    Spouse Name: N/A  . Number of Children: N/A  . Years of Education: N/A   Occupational History  . Not on file.   Social History Main Topics  . Smoking status: Current Every Day Smoker -- 1.00 packs/day for 50 years    Types: Cigarettes  . Smokeless tobacco: Never Used     Comment: pt states that she has been trying to quit but can't seem to quit  . Alcohol Use: No  . Drug Use: No  . Sexual Activity: Not on file   Other Topics Concern  . Not on file   Social History Narrative     Objective: BP 135/74 mmHg  Pulse 65  Wt 135 lb (61.236 kg)  General: Alert and Oriented, No Acute Distress HEENT: Pupils equal, round, reactive to light. Conjunctivae clear.  External ears unremarkable, canals clear with intact TMs with appropriate landmarks.  Middle ear appears open without effusion. Pink inferior turbinates.  Moist mucous membranes, pharynx without inflammation nor lesions.  Neck supple without palpable  lymphadenopathy nor abnormal masses. Lungs: Clear to auscultation bilaterally, no wheezing/ronchi/rales.  Comfortable work of breathing. Good air movement.occasional coughing Cardiac: Regular rate and rhythm. Normal S1/S2.  No murmurs, rubs, nor gallops.   Extremities: No peripheral edema.  Strong peripheral pulses.  Mental Status: No depression, anxiety, nor agitation. Skin: Warm and dry.  Assessment & Plan: Heather Perkins was seen today for uri and medication refill.  Diagnoses and all orders for this visit:  Chronic obstructive pulmonary disease, unspecified COPD type ([place])  Claudication of right lower extremity (HCC) -     cilostazol (PLETAL) 100 MG tablet; Take 1 tablet by mouth two times a day  Dysuria -     Ambulatory referral to Urology  Hyperthyroidism -     Ambulatory referral to Endocrinology  DDD (degenerative disc  disease), cervical -     HYDROcodone-acetaminophen (NORCO) 10-325 MG tablet; Take 1 tablet by mouth every 8 (eight) hours as needed.  Other orders -     Tiotropium Bromide Monohydrate (SPIRIVA RESPIMAT) 2.5 MCG/ACT AERS; Two inhalations daily to help reduce cough.   COPD: Uncontrolled chronic condition adding Spiriva Limb claudication: Asymptomatic and stable with Pletal Hyperthyroidism: New referral to endocrinology for insurance purposes Dysuria: Per her request a referral to a [facility] urologist has been placed. Degenerative disc disease: Stable and not interfering with quality of life with hydrocodone use.  Return in about 3 months (around 02/19/2016).

## 2015-11-26 DIAGNOSIS — E0590 Thyrotoxicosis, unspecified without thyrotoxic crisis or storm: Secondary | ICD-10-CM | POA: Diagnosis not present

## 2015-11-26 DIAGNOSIS — R5383 Other fatigue: Secondary | ICD-10-CM | POA: Diagnosis not present

## 2015-12-01 ENCOUNTER — Telehealth: Payer: Self-pay

## 2015-12-01 MED ORDER — TIOTROPIUM BROMIDE MONOHYDRATE 2.5 MCG/ACT IN AERS: INHALATION_SPRAY | RESPIRATORY_TRACT | Status: DC

## 2015-12-01 NOTE — Telephone Encounter (Signed)
Pt reports that she believes that spiriva is working for her.  She would like to know can she get a refill?

## 2015-12-01 NOTE — Telephone Encounter (Signed)
Rx sent to [facility].

## 2015-12-01 NOTE — Telephone Encounter (Signed)
Pt.notified

## 2015-12-24 ENCOUNTER — Ambulatory Visit (INDEPENDENT_AMBULATORY_CARE_PROVIDER_SITE_OTHER): Payer: Commercial Managed Care - HMO | Admitting: Family Medicine

## 2015-12-24 ENCOUNTER — Encounter: Payer: Self-pay | Admitting: Family Medicine

## 2015-12-24 VITALS — BP 116/78 | HR 105 | Wt 132.0 lb

## 2015-12-24 DIAGNOSIS — R51 Headache: Secondary | ICD-10-CM | POA: Diagnosis not present

## 2015-12-24 DIAGNOSIS — J329 Chronic sinusitis, unspecified: Secondary | ICD-10-CM | POA: Diagnosis not present

## 2015-12-24 DIAGNOSIS — B9689 Other specified bacterial agents as the cause of diseases classified elsewhere: Secondary | ICD-10-CM

## 2015-12-24 DIAGNOSIS — F411 Generalized anxiety disorder: Secondary | ICD-10-CM

## 2015-12-24 DIAGNOSIS — J449 Chronic obstructive pulmonary disease, unspecified: Secondary | ICD-10-CM | POA: Diagnosis not present

## 2015-12-24 DIAGNOSIS — A499 Bacterial infection, unspecified: Secondary | ICD-10-CM

## 2015-12-24 DIAGNOSIS — R519 Headache, unspecified: Secondary | ICD-10-CM

## 2015-12-24 MED ORDER — VENLAFAXINE HCL ER 150 MG PO CP24: 150.0000 mg | ORAL_CAPSULE | Freq: Every day | ORAL | Status: DC

## 2015-12-24 MED ORDER — AMOXICILLIN-POT CLAVULANATE 500-125 MG PO TABS: ORAL_TABLET | ORAL | Status: AC

## 2015-12-24 NOTE — Progress Notes (Signed)
CC: Heather Perkins is a 82 y.o. female is here for URI   Subjective: HPI:  Facial pressure nasal congestion productive cough fatigue has been present for the past week and a half. Seems to slowly getting worse. No interventions as of yet. Denies fevers, chills, nor chest pain. Denies shortness of breath. She threw up yesterday but it looked like it was nothing but does not mucus. She denies blood in her sputum. She denies any confusion.  After stopping Effexor cold [place] about 5 days ago she developed some nauseousness and irritability. Denies any other known symptoms other than that described above. She was hoping to start cutting back on some of her medications, starting with Effexor.   Review Of Systems Outlined In HPI  Past Medical History  Diagnosis Date  . Fibromyalgia   . Depression   . Allergy   . GERD (gastroesophageal reflux disease)   . Hyperthyroidism 2010    sees Dr Heather Perkins, did RAI  . Colitis, ischemic ([place])   . Shingles   . Peripheral vascular disease ([place])   . Cancer [facility])     skin    Past Surgical History  Procedure Laterality Date  . Appendectomy  1939  . Tonsillectomy    . Breast biopsy  1954  . Abdominal hysterectomy  1975    for dub  . Hemorrhoid surgery  1969  . Orif ankle fracture  1979    left  . Carpal tunnel release  1990    right  . Cataract extraction  2009    right and left.   . Cervical fusion     Family History  Problem Relation Age of Onset  . Hypertension Sister   . Cancer Daughter   . Hyperlipidemia Daughter     Social History   Social History  . Marital Status: Divorced    Spouse Name: N/A  . Number of Children: N/A  . Years of Education: N/A   Occupational History  . Not on file.   Social History Main Topics  . Smoking status: Current Every Day Smoker -- 1.00 packs/day for 50 years    Types: Cigarettes  . Smokeless tobacco: Never Used     Comment: pt states that she has been trying to quit but can't seem to quit  .  Alcohol Use: No  . Drug Use: No  . Sexual Activity: Not on file   Other Topics Concern  . Not on file   Social History Narrative     Objective: BP 116/78 mmHg  Pulse 105  Wt 132 lb (59.875 kg)  General: Alert and Oriented, No Acute Distress HEENT: Pupils equal, round, reactive to light. Conjunctivae clear.  External ears unremarkable, canals clear with intact TMs with appropriate landmarks.  Middle ear appears open without effusion. Pink inferior turbinates.  Moist mucous membranes, pharynx without inflammation nor lesions.  Neck supple without palpable lymphadenopathy nor abnormal masses. Lungs: Clear to auscultation bilaterally, no wheezing/ronchi/rales.  Comfortable work of breathing. Good air movement.. Extremities: No peripheral edema.  Strong peripheral pulses.  Mental Status: No depression, anxiety, nor agitation. Requires frequent redirection Skin: Warm and dry.  Assessment & Plan: Heather Perkins was seen today for uri.  Diagnoses and all orders for this visit:  Anxiety state  Chronic obstructive pulmonary disease, unspecified COPD type ([place])  Nonintractable headache, unspecified chronicity pattern, unspecified headache type -     venlafaxine XR (EFFEXOR-XR) 150 MG 24 hr capsule; Take 1 capsule (150 mg total) by mouth daily with  breakfast. To help headache and mood.  Bacterial sinusitis  Other orders -     amoxicillin-clavulanate (AUGMENTIN) 500-125 MG tablet; Take one by mouth every 8 hours for ten total days.   Anxiety: Controlled without Effexor. Chronic headaches have not returned since stopping Effexor. Spectral sinusitis: Start Augmentin, low suspicion for COPD flare I do believe her cough is primarily due to postnasal drip  25 minutes spent face-to-face during visit today of which at least 50% was counseling or coordinating care regarding: 1. Anxiety state   2. Chronic obstructive pulmonary disease, unspecified COPD type ([place])   3. Nonintractable headache,  unspecified chronicity pattern, unspecified headache type   4. Bacterial sinusitis       No Follow-up on file.

## 2015-12-31 ENCOUNTER — Telehealth: Payer: Self-pay

## 2015-12-31 MED ORDER — LEVOFLOXACIN 500 MG PO TABS: 500.0000 mg | ORAL_TABLET | Freq: Every day | ORAL | Status: DC

## 2015-12-31 MED ORDER — VENLAFAXINE HCL ER 37.5 MG PO CP24: 37.5000 mg | ORAL_CAPSULE | Freq: Every day | ORAL | Status: DC

## 2015-12-31 NOTE — Telephone Encounter (Signed)
Yes take a different antibiotic called levaquin that I've sent to her [place] pharmacy.  Any additional questions will require an office visit.

## 2015-12-31 NOTE — Addendum Note (Signed)
Addended by: Marcial Pacas on: 12/31/2015 04:07 PM   Modules accepted: Orders

## 2015-12-31 NOTE — Telephone Encounter (Signed)
Daughter notified 

## 2015-12-31 NOTE — Telephone Encounter (Signed)
effexor at a dose 1/4 of what she was taking has been sent to [facility]

## 2015-12-31 NOTE — Telephone Encounter (Signed)
Daughter stated that when Heather Perkins was taking the amoxicillin she would vomit a lot and not eat.  Now that she has decided to stop taking it the vomiting stopped.  Should pt take a different antibiotic? Please advise.

## 2015-12-31 NOTE — Telephone Encounter (Signed)
Patient's daughter states she has been off the Effexor for the last two weeks and the withdraw symptoms are getting worse. She has nausea and vomiting. She is concerned that she may need a step down dose of the Effexor.

## 2016-01-24 ENCOUNTER — Other Ambulatory Visit: Payer: Self-pay

## 2016-01-24 MED ORDER — ALPRAZOLAM 0.5 MG PO TABS: 0.5000 mg | ORAL_TABLET | Freq: Four times a day (QID) | ORAL | Status: DC | PRN

## 2016-01-25 ENCOUNTER — Other Ambulatory Visit: Payer: Self-pay

## 2016-01-25 ENCOUNTER — Encounter: Payer: Self-pay | Admitting: Family Medicine

## 2016-01-25 ENCOUNTER — Ambulatory Visit (INDEPENDENT_AMBULATORY_CARE_PROVIDER_SITE_OTHER): Payer: Commercial Managed Care - HMO | Admitting: Family Medicine

## 2016-01-25 VITALS — BP 135/74 | HR 83 | Wt 130.0 lb

## 2016-01-25 DIAGNOSIS — K589 Irritable bowel syndrome without diarrhea: Secondary | ICD-10-CM | POA: Diagnosis not present

## 2016-01-25 DIAGNOSIS — F3178 Bipolar disorder, in full remission, most recent episode mixed: Secondary | ICD-10-CM

## 2016-01-25 DIAGNOSIS — I739 Peripheral vascular disease, unspecified: Secondary | ICD-10-CM

## 2016-01-25 DIAGNOSIS — M5030 Other cervical disc degeneration, unspecified cervical region: Secondary | ICD-10-CM

## 2016-01-25 MED ORDER — DICYCLOMINE HCL 10 MG PO CAPS
10.0000 mg | ORAL_CAPSULE | Freq: Three times a day (TID) | ORAL | Status: DC
Start: 2016-01-25 — End: 2016-03-10

## 2016-01-25 MED ORDER — ALPRAZOLAM 0.5 MG PO TABS: 0.5000 mg | ORAL_TABLET | Freq: Four times a day (QID) | ORAL | Status: DC | PRN

## 2016-01-25 MED ORDER — HYDROCODONE-ACETAMINOPHEN 10-325 MG PO TABS: 1.0000 | ORAL_TABLET | Freq: Three times a day (TID) | ORAL | Status: DC | PRN

## 2016-01-25 NOTE — Progress Notes (Signed)
CC: Heather Perkins is a 82 y.o. female is here for Abdominal Pain   Subjective: HPI:   last week she made this appointment for vomiting and abdominal pain. She tells me that since the week and she's no longer having any vomiting and is slowly getting her appetite back. She tells me she still has occasional abdominal pain is described as a squeezing sensation just below the umbilicus that only occurs after drinking or eating something. It lasts seconds to minutes, nonradiating, nothing particularly makes it better or worse other than the dietary link above. She has some constipationlast week however fixed this with Dulcolax.she denies any genitourinary complaints. All of these symptoms started a few days after taking Augmentin. She denies fevers, chills, short of breath cough or blood in stool.  Follow degenerative disc disease: She is requesting a refill on hydrocodone, 90 pills have lasted her a little over 2 months. She takes at least 1 day first thing in the morning for chronic neck pain but no new radicular symptoms.  Follow claudication of the right large family. She is taking Pletal daily and denies any claudication symptoms. She is not walking as much as she used to due to the weather.  Since I saw her last she stopped taking Effexor and denies any manic activity, depression, anxiety or paranoia.  Review Of Systems Outlined In HPI  Past Medical History  Diagnosis Date  . Fibromyalgia   . Depression   . Allergy   . GERD (gastroesophageal reflux disease)   . Hyperthyroidism 2010    sees Dr Marcello Moores, did RAI  . Colitis, ischemic ([place])   . Shingles   . Peripheral vascular disease ([place])   . Cancer [facility])     skin    Past Surgical History  Procedure Laterality Date  . Appendectomy  1939  . Tonsillectomy    . Breast biopsy  1954  . Abdominal hysterectomy  1975    for dub  . Hemorrhoid surgery  1969  . Orif ankle fracture  1979    left  . Carpal tunnel release  1990    right  .  Cataract extraction  2009    right and left.   . Cervical fusion     Family History  Problem Relation Age of Onset  . Hypertension Sister   . Cancer Daughter   . Hyperlipidemia Daughter     Social History   Social History  . Marital Status: Divorced    Spouse Name: N/A  . Number of Children: N/A  . Years of Education: N/A   Occupational History  . Not on file.   Social History Main Topics  . Smoking status: Current Every Day Smoker -- 1.00 packs/day for 50 years    Types: Cigarettes  . Smokeless tobacco: Never Used     Comment: pt states that she has been trying to quit but can't seem to quit  . Alcohol Use: No  . Drug Use: No  . Sexual Activity: Not on file   Other Topics Concern  . Not on file   Social History Narrative     Objective: BP 135/74 mmHg  Pulse 83  Wt 130 lb (58.968 kg)  Vital signs reviewed. General: Alert and Oriented, No Acute Distress HEENT: Pupils equal, round, reactive to light. Conjunctivae clear.  External ears unremarkable.  Moist mucous membranes. Lungs: Clear and comfortable work of breathing, speaking in full sentences without accessory muscle use. Cardiac: Regular rate and rhythm.  Neuro: CN  II-XII grossly intact, gait normal. Extremities: No peripheral edema.  Strong peripheral pulses.  Mental Status: No depression, anxiety, nor agitation. Logical though process. Skin: Warm and dry.  Assessment & Plan: Kamani was seen today for abdominal pain.  Diagnoses and all orders for this visit:  DDD (degenerative disc disease), cervical -     HYDROcodone-acetaminophen (NORCO) 10-325 MG tablet; Take 1 tablet by mouth every 8 (eight) hours as needed.  Claudication of right lower extremity (HCC)  Bipolar disorder, in full remission, most recent episode mixed ([place])  Spasm of bowel  Other orders -     dicyclomine (BENTYL) 10 MG capsule; Take 1 capsule (10 mg total) by mouth 4 (four) times daily -  before meals and at bedtime.   DDD:  stable with hydrocodone Right lower showing claudication: Stable with Pletal no changes to medication regimen Bipolar disorder: Currently in remission and tolerating being off of Effexor, continues on alprazolam Bowel spasm: Start dicyclomine for the next 10 days, hopefully she will be able to stop this cold [place] without any return of her spasms.   Return if symptoms worsen or fail to improve.

## 2016-02-11 ENCOUNTER — Telehealth: Payer: Self-pay | Admitting: Family Medicine

## 2016-02-11 MED ORDER — ALPRAZOLAM 0.5 MG PO TABS: 0.5000 mg | ORAL_TABLET | Freq: Four times a day (QID) | ORAL | Status: DC | PRN

## 2016-02-11 NOTE — Telephone Encounter (Signed)
Heather Perkins, Rx placed in in-box ready for pickup/faxing.  

## 2016-02-11 NOTE — Telephone Encounter (Signed)
Rx faxed

## 2016-03-08 DIAGNOSIS — R634 Abnormal weight loss: Secondary | ICD-10-CM | POA: Diagnosis not present

## 2016-03-08 DIAGNOSIS — R1310 Dysphagia, unspecified: Secondary | ICD-10-CM | POA: Diagnosis not present

## 2016-03-08 DIAGNOSIS — R1033 Periumbilical pain: Secondary | ICD-10-CM | POA: Diagnosis not present

## 2016-03-10 ENCOUNTER — Other Ambulatory Visit: Payer: Self-pay

## 2016-03-10 DIAGNOSIS — M5030 Other cervical disc degeneration, unspecified cervical region: Secondary | ICD-10-CM

## 2016-03-10 MED ORDER — DICYCLOMINE HCL 10 MG PO CAPS: 10.0000 mg | ORAL_CAPSULE | Freq: Three times a day (TID) | ORAL | Status: DC

## 2016-03-10 MED ORDER — HYDROCODONE-ACETAMINOPHEN 10-325 MG PO TABS: 1.0000 | ORAL_TABLET | Freq: Three times a day (TID) | ORAL | Status: DC | PRN

## 2016-03-14 DIAGNOSIS — R1013 Epigastric pain: Secondary | ICD-10-CM | POA: Diagnosis not present

## 2016-03-14 DIAGNOSIS — R112 Nausea with vomiting, unspecified: Secondary | ICD-10-CM | POA: Diagnosis not present

## 2016-03-14 DIAGNOSIS — R1310 Dysphagia, unspecified: Secondary | ICD-10-CM | POA: Diagnosis not present

## 2016-03-24 ENCOUNTER — Other Ambulatory Visit: Payer: Self-pay

## 2016-03-24 DIAGNOSIS — I739 Peripheral vascular disease, unspecified: Secondary | ICD-10-CM

## 2016-03-24 MED ORDER — CILOSTAZOL 100 MG PO TABS: ORAL_TABLET | ORAL | Status: DC

## 2016-03-27 ENCOUNTER — Telehealth: Payer: Self-pay

## 2016-03-27 ENCOUNTER — Other Ambulatory Visit: Payer: Self-pay

## 2016-03-27 MED ORDER — DICYCLOMINE HCL 10 MG PO CAPS
10.0000 mg | ORAL_CAPSULE | Freq: Three times a day (TID) | ORAL | Status: DC
Start: 2016-03-27 — End: 2016-08-15

## 2016-03-27 NOTE — Telephone Encounter (Signed)
Pt daughter called to inform you that Ms. Streitmatter is having a endoscopy done. Just an FYI.

## 2016-04-06 ENCOUNTER — Telehealth: Payer: Self-pay

## 2016-04-06 NOTE — Telephone Encounter (Signed)
Heather Perkins called and left a message stating she is going to have an Endoscopy on July 5 th. She was advised to call her PCP to find out the schedule for stopping Pletal. Please advise.

## 2016-04-07 NOTE — Telephone Encounter (Signed)
Notified patient of recommendation, and she stated she understood.

## 2016-04-07 NOTE — Telephone Encounter (Signed)
I'd recommend stopping this medication two days prior to her endoscopy and starting it the day after the endoscopy provided there were no bleeding complications from the procedure.

## 2016-04-12 DIAGNOSIS — K449 Diaphragmatic hernia without obstruction or gangrene: Secondary | ICD-10-CM | POA: Diagnosis not present

## 2016-04-12 DIAGNOSIS — K2210 Ulcer of esophagus without bleeding: Secondary | ICD-10-CM | POA: Diagnosis not present

## 2016-04-12 DIAGNOSIS — R1310 Dysphagia, unspecified: Secondary | ICD-10-CM | POA: Diagnosis not present

## 2016-04-12 DIAGNOSIS — K209 Esophagitis, unspecified: Secondary | ICD-10-CM | POA: Diagnosis not present

## 2016-04-12 DIAGNOSIS — K219 Gastro-esophageal reflux disease without esophagitis: Secondary | ICD-10-CM | POA: Diagnosis not present

## 2016-04-27 ENCOUNTER — Encounter: Payer: Self-pay | Admitting: Family Medicine

## 2016-05-11 ENCOUNTER — Other Ambulatory Visit: Payer: Self-pay

## 2016-05-11 DIAGNOSIS — K449 Diaphragmatic hernia without obstruction or gangrene: Secondary | ICD-10-CM | POA: Diagnosis not present

## 2016-05-11 MED ORDER — TIOTROPIUM BROMIDE MONOHYDRATE 2.5 MCG/ACT IN AERS: INHALATION_SPRAY | RESPIRATORY_TRACT | 4 refills | Status: DC

## 2016-05-19 DIAGNOSIS — R112 Nausea with vomiting, unspecified: Secondary | ICD-10-CM | POA: Diagnosis not present

## 2016-05-19 DIAGNOSIS — R1310 Dysphagia, unspecified: Secondary | ICD-10-CM | POA: Diagnosis not present

## 2016-06-08 DIAGNOSIS — E049 Nontoxic goiter, unspecified: Secondary | ICD-10-CM | POA: Diagnosis not present

## 2016-06-08 DIAGNOSIS — E0590 Thyrotoxicosis, unspecified without thyrotoxic crisis or storm: Secondary | ICD-10-CM | POA: Diagnosis not present

## 2016-06-22 ENCOUNTER — Telehealth: Payer: Self-pay

## 2016-06-22 DIAGNOSIS — M5030 Other cervical disc degeneration, unspecified cervical region: Secondary | ICD-10-CM

## 2016-06-22 MED ORDER — HYDROCODONE-ACETAMINOPHEN 10-325 MG PO TABS: 1.0000 | ORAL_TABLET | Freq: Three times a day (TID) | ORAL | 0 refills | Status: DC | PRN

## 2016-06-22 NOTE — Telephone Encounter (Signed)
If due for refill, can give one-week supply, if not seeing next week some time for appointment, will not continue controlled substance prescription

## 2016-06-22 NOTE — Telephone Encounter (Signed)
Heather Perkins called and would like a refill on her hydrocodone. She will make a new patient appointment next week. She is due for a refill. She is also due for a follow up. Please advise.

## 2016-06-22 NOTE — Telephone Encounter (Signed)
Patient advised and will pick up prescription tomorrow.

## 2016-06-28 ENCOUNTER — Ambulatory Visit (INDEPENDENT_AMBULATORY_CARE_PROVIDER_SITE_OTHER): Payer: Commercial Managed Care - HMO | Admitting: Osteopathic Medicine

## 2016-06-28 VITALS — BP 118/79 | HR 87 | Wt 130.0 lb

## 2016-06-28 DIAGNOSIS — E785 Hyperlipidemia, unspecified: Secondary | ICD-10-CM | POA: Diagnosis not present

## 2016-06-28 DIAGNOSIS — Z720 Tobacco use: Secondary | ICD-10-CM

## 2016-06-28 DIAGNOSIS — M25551 Pain in right hip: Secondary | ICD-10-CM | POA: Diagnosis not present

## 2016-06-28 DIAGNOSIS — K219 Gastro-esophageal reflux disease without esophagitis: Secondary | ICD-10-CM

## 2016-06-28 DIAGNOSIS — M791 Myalgia: Secondary | ICD-10-CM | POA: Diagnosis not present

## 2016-06-28 DIAGNOSIS — M609 Myositis, unspecified: Secondary | ICD-10-CM

## 2016-06-28 DIAGNOSIS — IMO0001 Reserved for inherently not codable concepts without codable children: Secondary | ICD-10-CM

## 2016-06-28 DIAGNOSIS — H9193 Unspecified hearing loss, bilateral: Secondary | ICD-10-CM

## 2016-06-28 MED ORDER — PRAVASTATIN SODIUM 40 MG PO TABS: 40.0000 mg | ORAL_TABLET | Freq: Every day | ORAL | 3 refills | Status: DC

## 2016-06-28 NOTE — Progress Notes (Signed)
HPI: Heather Perkins is a 82 y.o. female  who presents to [facility] today, 06/29/16,  for chief complaint of:  Chief Complaint  Patient presents with  . Osteoarthritis    med refill    Patient here for medication refill, previously written prescription for pain medication was up front for her. She is here to establish care with me since Dr. Ileene Rubens left practice.  Patient presents form to fill out for handicap parking, has difficulty walking long distance due to arthritis.  She brings records from recent endoscopy with GI, they have reduced dose statin and opted to continue PPI.    Past medical, surgical, social and family history reviewed: Past Medical History:  Diagnosis Date  . Allergy   . Cancer ([place])    skin  . Colitis, ischemic ([place])   . Depression   . Fibromyalgia   . GERD (gastroesophageal reflux disease)   . Hyperthyroidism 2010   sees Dr Marcello Moores, did RAI  . Peripheral vascular disease ([place])   . Shingles    Past Surgical History:  Procedure Laterality Date  . ABDOMINAL HYSTERECTOMY  1975   for dub  . APPENDECTOMY  1939  . BREAST BIOPSY  1954  . [place]   right  . CATARACT EXTRACTION  2009   right and left.   . CERVICAL FUSION    . [place]  . ORIF ANKLE FRACTURE  1979   left  . TONSILLECTOMY     Social History  Substance Use Topics  . Smoking status: Current Every Day Smoker    Packs/day: 1.00    Years: 50.00    Types: Cigarettes  . Smokeless tobacco: Never Used     Comment: pt states that she has been trying to quit but can't seem to quit  . Alcohol use No   Family History  Problem Relation Age of Onset  . Hypertension Sister   . Cancer Daughter   . Hyperlipidemia Daughter      Current medication list and allergy/intolerance information reviewed:   Current Outpatient Prescriptions  Medication Sig Dispense Refill  . albuterol (PROVENTIL HFA;VENTOLIN HFA) 108 (90 BASE)  MCG/ACT inhaler Inhale 2 puffs into the lungs every 6 (six) hours as needed for wheezing or shortness of breath. 1 Inhaler 3  . ALPRAZolam (XANAX) 0.5 MG tablet Take 1 tablet (0.5 mg total) by mouth every 6 (six) hours as needed. 100 tablet 1  . aspirin EC 81 MG tablet Take 1 tablet (81 mg total) by mouth daily. 90 tablet 3  . B Complex Vitamins (B COMPLEX PO) Take by mouth.    . cilostazol (PLETAL) 100 MG tablet Take 1 tablet by mouth two times a day 60 tablet 2  . cyclobenzaprine (FLEXERIL) 10 MG tablet Take 1 tablet (10 mg total) by mouth 3 (three) times daily as needed for muscle spasms. 90 tablet 2  . dicyclomine (BENTYL) 10 MG capsule Take 1 capsule (10 mg total) by mouth 4 (four) times daily -  before meals and at bedtime. 120 capsule 0  . fish oil-omega-3 fatty acids 1000 MG capsule Take 2 g by mouth daily.    . fluticasone (FLONASE) 50 MCG/ACT nasal spray Place 2 sprays into both nostrils daily. 16 g 1  . HYDROcodone-acetaminophen (NORCO) 10-325 MG tablet Take 1 tablet by mouth every 8 (eight) hours as needed. 23 tablet 0  . magnesium 30 MG tablet Take 30 mg by mouth 2 (  two) times daily.    E[place]ney [place] Bicarbonate (ZEGERID) 20-1100 MG CAPS capsule Take 1 capsule by mouth daily before breakfast.    . polyethylene glycol (MIRALAX / GLYCOLAX) packet Take 17 g by mouth daily.      . pravastatin (PRAVACHOL) 40 MG tablet Take 1 tablet (40 mg total) by mouth daily. 90 tablet 3  . propylthiouracil (PTU) 50 MG tablet Take 50 mg by mouth 2 (two) times daily.     . Tiotropium Bromide Monohydrate (SPIRIVA RESPIMAT) 2.5 MCG/ACT AERS Two inhalations daily to help reduce cough. 1 Inhaler 4   No current facility-administered medications for this visit.    Allergies  Allergen Reactions  . Augmentin [Amoxicillin-Pot Clavulanate]     Vomiting  . Azithromycin   . Celexa [Citalopram Hydrobromide]   . Codeine   . Crestor [Rosuvastatin Calcium] Nausea Only  . Disalcid [Salsalate]   . Elavil  [Amitriptyline Hcl]   . Erythromycin   . Lexapro [Escitalopram Oxalate]   . Livalo [Pitavastatin Calcium] Other (See Comments)    Headache.   Alvina Filbert [Hydrocodone-Acetaminophen]   . Methimazole   . Naproxen     nausea  . Neosporin [Neomycin-Bacitracin Zn-Polymyx]   . Neosporin [Neomycin-Polymyxin-Gramicidin]   . Prozac [Fluoxetine]   . Talwin [Pentazocine]   . Tetracyclines & Related   . Wellbutrin [Bupropion] Other (See Comments)    constipation      Review of Systems:  Constitutional:  No  fever, no chills, No recent illness   HEENT: No  headache  Cardiac: No  chest pain  Respiratory:  No  shortness of breath.  Gastrointestinal: No  abdominal pain, No  nausea  Musculoskeletal: No new myalgia/arthralgia  Psychiatric: No  concerns with depression, No  concerns with anxiety,  Exam:  BP 118/79   Pulse 87   Wt 130 lb (59 kg)   BMI 23.03 kg/m   Constitutional: VS see above. General Appearance: alert, well-developed, well-nourished, NAD  Eyes: Normal lids and conjunctive, non-icteric sclera  Ears, Nose, Mouth, Throat: MMM, Normal external inspection ears/nares/mouth/lips/gums.  Neck: No masses, trachea midline.   Respiratory: Normal respiratory effort. no wheeze, no rhonchi, no rales  Cardiovascular: S1/S2 normal, no murmur, no rub/gallop auscultated. RRR.    Skin: warm, dry, intact.      ASSESSMENT/PLAN: Advised patient bring all medications to all office visits. She is overdue for routine lab work. Advised she come back for annual physical  Gastroesophageal reflux disease, esophagitis presence not specified  Myalgia and myositis  Hyperlipidemia  Right hip pain  Tobacco abuse  Hard of hearing, bilateral     Visit summary with medication list and pertinent instructions was printed for patient to review. All questions at time of visit were answered - patient instructed to contact office with any additional concerns. ER/RTC precautions were reviewed  with the patient. Follow-up plan: Return in about 3 months (around 09/27/2016), or sooner if needed , for ANNUAL PHYSICAL .  Note: Total time spent 25 minutes, greater than 50% of the visit was spent face-to-face counseling and coordinating care for the following: The primary encounter diagnosis was Gastroesophageal reflux disease, esophagitis presence not specified. Diagnoses of Myalgia and myositis, Hyperlipidemia, Right hip pain, and Tobacco abuse were also pertinent to this visit.Marland Kitchen

## 2016-07-12 ENCOUNTER — Other Ambulatory Visit: Payer: Self-pay

## 2016-07-12 DIAGNOSIS — M5030 Other cervical disc degeneration, unspecified cervical region: Secondary | ICD-10-CM

## 2016-07-12 MED ORDER — CYCLOBENZAPRINE HCL 10 MG PO TABS: 10.0000 mg | ORAL_TABLET | Freq: Three times a day (TID) | ORAL | 2 refills | Status: DC | PRN

## 2016-07-12 NOTE — Telephone Encounter (Signed)
Patient request refill for Cyclobenzaprine 10 mg #90 2 refills was sent to [facility].  ,CMA

## 2016-08-11 ENCOUNTER — Other Ambulatory Visit: Payer: Self-pay

## 2016-08-11 DIAGNOSIS — I70219 Atherosclerosis of native arteries of extremities with intermittent claudication, unspecified extremity: Secondary | ICD-10-CM

## 2016-08-11 DIAGNOSIS — K219 Gastro-esophageal reflux disease without esophagitis: Secondary | ICD-10-CM

## 2016-08-11 DIAGNOSIS — M5030 Other cervical disc degeneration, unspecified cervical region: Secondary | ICD-10-CM

## 2016-08-11 DIAGNOSIS — E0590 Thyrotoxicosis, unspecified without thyrotoxic crisis or storm: Secondary | ICD-10-CM

## 2016-08-11 DIAGNOSIS — J449 Chronic obstructive pulmonary disease, unspecified: Secondary | ICD-10-CM

## 2016-08-11 NOTE — Progress Notes (Signed)
Heather Perkins would like a refill on hydrocodone. Is this a long term medication? Also, is it ok to take with xanax?

## 2016-08-14 MED ORDER — HYDROCODONE-ACETAMINOPHEN 10-325 MG PO TABS
1.0000 | ORAL_TABLET | Freq: Three times a day (TID) | ORAL | 0 refills | Status: DC | PRN
Start: 2016-08-14 — End: 2016-10-17

## 2016-08-14 NOTE — Progress Notes (Signed)
Patient scheduled for a follow up appointment.  ?

## 2016-08-14 NOTE — Progress Notes (Signed)
Looks like she's been on the hydrocodone from Dr. Ileene Rubens for several years. I'm okay to continue that, I don't think we talked about concurrent Xanax use at her last visit. Taking both medications together is certainly not advised can we should discuss tapering off of the Xanax and/or reducing the dose of hydrocodone.   At any rate, patient is overdue for routine lab work and was advised to return to clinic anyway for this. Let's have her schedule a visit to get labs done, bring all pill bottles to her visit so we can discuss everything in detail and initiate contract for controlled substances if we are going to continue these medications. Okay to refill hydrocodone, I've signed the order - no additional refills will be authorized without office visit.   Thanks.

## 2016-08-15 ENCOUNTER — Ambulatory Visit (INDEPENDENT_AMBULATORY_CARE_PROVIDER_SITE_OTHER): Payer: Commercial Managed Care - HMO | Admitting: Osteopathic Medicine

## 2016-08-15 ENCOUNTER — Encounter: Payer: Self-pay | Admitting: Osteopathic Medicine

## 2016-08-15 VITALS — BP 111/83 | HR 96 | Ht 62.0 in | Wt 129.0 lb

## 2016-08-15 DIAGNOSIS — F411 Generalized anxiety disorder: Secondary | ICD-10-CM | POA: Diagnosis not present

## 2016-08-15 DIAGNOSIS — L821 Other seborrheic keratosis: Secondary | ICD-10-CM

## 2016-08-15 DIAGNOSIS — I739 Peripheral vascular disease, unspecified: Secondary | ICD-10-CM

## 2016-08-15 DIAGNOSIS — K279 Peptic ulcer, site unspecified, unspecified as acute or chronic, without hemorrhage or perforation: Secondary | ICD-10-CM | POA: Diagnosis not present

## 2016-08-15 MED ORDER — RANITIDINE HCL 300 MG PO TABS: 300.0000 mg | ORAL_TABLET | Freq: Every day | ORAL | 1 refills | Status: DC

## 2016-08-15 MED ORDER — CILOSTAZOL 100 MG PO TABS: 100.0000 mg | ORAL_TABLET | Freq: Two times a day (BID) | ORAL | 3 refills | Status: DC

## 2016-08-15 MED ORDER — ALPRAZOLAM 0.5 MG PO TABS: 0.2500 mg | ORAL_TABLET | Freq: Two times a day (BID) | ORAL | 2 refills | Status: DC | PRN

## 2016-08-15 NOTE — Progress Notes (Signed)
HPI: Heather Perkins is a 82 y.o. female  who presents to [facility] today, 08/15/16,  for chief complaint of:  Chief Complaint  Patient presents with  . Follow-up    MEDICATION     Medication management: Patient requested refill of benzodiazepines, noticed that she had been given a prescription for 100 tablets of Xanax, I was a bit concerned about this. Asked patient to come in for further clarification, given that she is also on 3 times a day opiate therapy at a fairly high doses well. States that she uses the Xanax may be 4-5 days out of the week, typically once per day, often with a half a tablet but usually with a whole tablet. She is a retired [employer] scientist, she is aware of the interaction issues between the benzodiazepines and opiates, she does not take these 2 medications together. Has been on daily antianxiety/antidepressant medications in the past but did not do well with side effects of these other medicines  Additional concern today: Patient states that she was recently treated for peptic ulcer disease. Has been doing well on omeprazole and ranitidine but isn't sure how long she should be staying on these medications.  Patient is also concerned about spots on face, history of skin cancer on nose. She doesn't seem overly concerned about this diagnosis, she is more concerned about cosmetic, like removal of spots/itching areas  Patient is accompanied by daughter, [name], who assists with history.  Past medical, surgical, social and family history reviewed: Past Medical History:  Diagnosis Date  . Allergy   . Cancer ([place])    skin  . Colitis, ischemic ([place])   . Depression   . Fibromyalgia   . GERD (gastroesophageal reflux disease)   . Hyperthyroidism 2010   sees Dr Marcello Moores, did RAI  . Peripheral vascular disease ([place])   . Shingles    Past Surgical History:  Procedure Laterality Date  . ABDOMINAL HYSTERECTOMY  1975   for dub  . APPENDECTOMY  1939   . BREAST BIOPSY  1954  . [place]   right  . CATARACT EXTRACTION  2009   right and left.   . CERVICAL FUSION    . [place]  . ORIF ANKLE FRACTURE  1979   left  . TONSILLECTOMY     Social History  Substance Use Topics  . Smoking status: Current Every Day Smoker    Packs/day: 1.00    Years: 50.00    Types: Cigarettes  . Smokeless tobacco: Never Used     Comment: pt states that she has been trying to quit but can't seem to quit  . Alcohol use No   Family History  Problem Relation Age of Onset  . Hypertension Sister   . Cancer Daughter   . Hyperlipidemia Daughter      Current medication list and allergy/intolerance information reviewed:   Current Outpatient Prescriptions on File Prior to Visit  Medication Sig Dispense Refill  . albuterol (PROVENTIL HFA;VENTOLIN HFA) 108 (90 BASE) MCG/ACT inhaler Inhale 2 puffs into the lungs every 6 (six) hours as needed for wheezing or shortness of breath. 1 Inhaler 3  . ALPRAZolam (XANAX) 0.5 MG tablet Take 1 tablet (0.5 mg total) by mouth every 6 (six) hours as needed. 100 tablet 1  . aspirin EC 81 MG tablet Take 1 tablet (81 mg total) by mouth daily. 90 tablet 3  . B Complex Vitamins (B COMPLEX PO) Take by  mouth.    . cilostazol (PLETAL) 100 MG tablet Take 1 tablet by mouth two times a day 60 tablet 2  . cyclobenzaprine (FLEXERIL) 10 MG tablet Take 1 tablet (10 mg total) by mouth 3 (three) times daily as needed for muscle spasms. 90 tablet 2  . fish oil-omega-3 fatty acids 1000 MG capsule Take 2 g by mouth daily.    . fluticasone (FLONASE) 50 MCG/ACT nasal spray Place 2 sprays into both nostrils daily. 16 g 1  . HYDROcodone-acetaminophen (NORCO) 10-325 MG tablet Take 1 tablet by mouth every 8 (eight) hours as needed. Use sparingly to prevent tolerance/dependence 90 tablet 0  . polyethylene glycol (MIRALAX / GLYCOLAX) packet Take 17 g by mouth daily.      . pravastatin (PRAVACHOL) 40 MG tablet Take 1 tablet  (40 mg total) by mouth daily. 90 tablet 3  . propylthiouracil (PTU) 50 MG tablet Take 50 mg by mouth 2 (two) times daily.     . Tiotropium Bromide Monohydrate (SPIRIVA RESPIMAT) 2.5 MCG/ACT AERS Two inhalations daily to help reduce cough. 1 Inhaler 4   No current facility-administered medications on file prior to visit.    Allergies  Allergen Reactions  . Augmentin [Amoxicillin-Pot Clavulanate]     Vomiting  . Azithromycin   . Celexa [Citalopram Hydrobromide]   . Codeine   . Crestor [Rosuvastatin Calcium] Nausea Only  . Disalcid [Salsalate]   . Elavil [Amitriptyline Hcl]   . Erythromycin   . Lexapro [Escitalopram Oxalate]   . Livalo [Pitavastatin Calcium] Other (See Comments)    Headache.   Alvina Filbert [Hydrocodone-Acetaminophen]   . Methimazole   . Naproxen     nausea  . Neosporin [Neomycin-Bacitracin Zn-Polymyx]   . Neosporin [Neomycin-Polymyxin-Gramicidin]   . Prozac [Fluoxetine]   . Talwin [Pentazocine]   . Tetracyclines & Related   . Wellbutrin [Bupropion] Other (See Comments)    constipation      Review of Systems:  Constitutional: No recent illness  HEENT: No  headache, no vision change  Cardiac: No  chest pain, No  pressure, No palpitations  Respiratory:  No  shortness of breath. No  Cough  Gastrointestinal: No  abdominal pain,  Musculoskeletal: No new myalgia/arthralgia  Skin: No  Rash, +other concerning lesions  Neurologic: No  weakness, No  Dizziness  Psychiatric: No  concerns with depression, +concerns with anxiety  Exam:  BP 111/83   Pulse 96   Ht 5\' 2"  (1.575 m)   Wt 129 lb (58.5 kg)   BMI 23.59 kg/m   Constitutional: VS see above. General Appearance: alert, well-developed, well-nourished, NAD  Eyes: Normal lids and conjunctive, non-icteric sclera  Ears, Nose, Mouth, Throat: MMM, Normal external inspection ears/nares/mouth/lips/gums.  Neck: No masses, trachea midline.   Respiratory: Normal respiratory effort. no wheeze, no rhonchi, no  rales  Cardiovascular: S1/S2 normal, no murmur, no rub/gallop auscultated. RRR.   Musculoskeletal: Gait normal. Symmetric and independent movement of all extremities  Neurological: Normal balance/coordination. No tremor.  Skin: warm, dry, intact. Multiple scattered seborrheic keratoses on face. Mild reddish raised spot on tip of nose, question actinic change versus dysplasia  Psychiatric: Normal judgment/insight. Normal mood and affect. Oriented x3.      ASSESSMENT/PLAN:    Generalized anxiety disorder - Extreme caution with benzodiazepines. I will not be prescribing 100 at a time, patient is alert and oriented and understands risks of medications. I advised if she is curious is any concerning symptoms such as delirium/fall which may be attributable to opiate/benzodiazepine therapy,  we will need to discuss a stiff/discontinuation of medications, patient is amenable to this plan and voices understanding. - Plan: ALPRAZolam (XANAX) 0.5 MG tablet  Claudication of right lower extremity (HCC) - Requests refill of cilostazol - Plan: cilostazol (PLETAL) 100 MG tablet  Peptic ulcer disease - Would continue Zantac plus Prilosec for now until GI says otherwise - Plan: ranitidine (ZANTAC) 300 MG tablet  Seborrheic keratoses - Offered dermatology referral. Patient declines. Plan to schedule visit to freeze lesions of concern. Declines biopsy. Patient does not seem concerned about previous cancer diagnosis.     Visit summary with medication list and pertinent instructions was printed for patient to review. All questions at time of visit were answered - patient instructed to contact office with any additional concerns. ER/RTC precautions were reviewed with the patient. Follow-up plan: Return in about 3 months (around 11/15/2016) for [place] SOONER IF NEEDED.

## 2016-09-08 DIAGNOSIS — E0590 Thyrotoxicosis, unspecified without thyrotoxic crisis or storm: Secondary | ICD-10-CM | POA: Diagnosis not present

## 2016-09-27 ENCOUNTER — Encounter: Payer: Commercial Managed Care - HMO | Admitting: Osteopathic Medicine

## 2016-10-13 ENCOUNTER — Telehealth: Payer: Self-pay

## 2016-10-13 DIAGNOSIS — M5030 Other cervical disc degeneration, unspecified cervical region: Secondary | ICD-10-CM

## 2016-10-13 NOTE — Telephone Encounter (Signed)
Please see note below. Rhonda Cunningham,CMA  

## 2016-10-13 NOTE — Telephone Encounter (Signed)
Heather Perkins is calling for a refill on the Hydrocodone. She is ok with waiting until Tuesday when Dr Sheppard Coil returns. She also needs to sign a controlled substance contract. Thank you Rhonda.

## 2016-10-17 MED ORDER — HYDROCODONE-ACETAMINOPHEN 10-325 MG PO TABS: 1.0000 | ORAL_TABLET | Freq: Three times a day (TID) | ORAL | 0 refills | Status: DC | PRN

## 2016-10-17 NOTE — Telephone Encounter (Signed)
State controlled substance database reviewed, last fill for opiate prescription medications was 08/17/2016 for 90 tablets of hydrocodone-acetaminophen. Refill is appropriate, patient will need to come in for nurse visit to complete controlled substance contract

## 2016-10-17 NOTE — Telephone Encounter (Signed)
patient has been informed. Heather Perkins,CMA

## 2016-11-15 ENCOUNTER — Encounter: Payer: Self-pay | Admitting: Osteopathic Medicine

## 2016-11-15 ENCOUNTER — Ambulatory Visit (INDEPENDENT_AMBULATORY_CARE_PROVIDER_SITE_OTHER): Payer: Commercial Managed Care - HMO | Admitting: Osteopathic Medicine

## 2016-11-15 VITALS — BP 137/81 | HR 77 | Ht 62.0 in | Wt 133.0 lb

## 2016-11-15 DIAGNOSIS — F411 Generalized anxiety disorder: Secondary | ICD-10-CM

## 2016-11-15 DIAGNOSIS — F341 Dysthymic disorder: Secondary | ICD-10-CM

## 2016-11-15 DIAGNOSIS — J302 Other seasonal allergic rhinitis: Secondary | ICD-10-CM

## 2016-11-15 DIAGNOSIS — E0590 Thyrotoxicosis, unspecified without thyrotoxic crisis or storm: Secondary | ICD-10-CM

## 2016-11-15 DIAGNOSIS — F3178 Bipolar disorder, in full remission, most recent episode mixed: Secondary | ICD-10-CM

## 2016-11-15 DIAGNOSIS — Z720 Tobacco use: Secondary | ICD-10-CM | POA: Diagnosis not present

## 2016-11-15 DIAGNOSIS — Z23 Encounter for immunization: Secondary | ICD-10-CM | POA: Diagnosis not present

## 2016-11-15 DIAGNOSIS — J449 Chronic obstructive pulmonary disease, unspecified: Secondary | ICD-10-CM | POA: Diagnosis not present

## 2016-11-15 MED ORDER — FLUTICASONE PROPIONATE 50 MCG/ACT NA SUSP: 2.0000 | Freq: Every day | NASAL | 11 refills | Status: DC

## 2016-11-15 MED ORDER — ALBUTEROL SULFATE HFA 108 (90 BASE) MCG/ACT IN AERS
2.0000 | INHALATION_SPRAY | Freq: Four times a day (QID) | RESPIRATORY_TRACT | 3 refills | Status: DC | PRN
Start: 2016-11-15 — End: 2019-06-23

## 2016-11-15 MED ORDER — VARENICLINE TARTRATE 1 MG PO TABS
1.0000 mg | ORAL_TABLET | Freq: Two times a day (BID) | ORAL | 1 refills | Status: DC
Start: 2016-11-15 — End: 2017-04-19

## 2016-11-15 MED ORDER — VARENICLINE TARTRATE 0.5 MG X 11 & 1 MG X 42 PO MISC: ORAL | 0 refills | Status: DC

## 2016-11-15 MED ORDER — TIOTROPIUM BROMIDE MONOHYDRATE 2.5 MCG/ACT IN AERS: INHALATION_SPRAY | RESPIRATORY_TRACT | 4 refills | Status: DC

## 2016-11-15 MED ORDER — ALPRAZOLAM 0.5 MG PO TABS: 0.2500 mg | ORAL_TABLET | Freq: Two times a day (BID) | ORAL | 2 refills | Status: DC | PRN

## 2016-11-15 NOTE — Patient Instructions (Addendum)
Plan:  Labs today - we will call you with these results   Plan to start Chantix  Flu shot and pneumonia shot today.  Plan to come back to the office in 3 months for medicare wellness visit with our RN and a visit with me after that to follow up on breathing  Please come back sooner if needed

## 2016-11-15 NOTE — Progress Notes (Signed)
HPI: Heather Perkins is a 83 y.o. female  who presents to [facility] today, 11/15/16,  for chief complaint of:  Chief Complaint  Patient presents with  . COPD    Initially on schedule for AWV but is concerned about COPD issues, breathing, fatigue  COPD/SOB: chronic cough. Pt is resistant to getting influenza and pneumonia vaccines, some confusion about not wanting to get sick from the shots. Is taking Spiriva, had some problems with infection d/t ICS (?thrush?). On and off SOB. Feeling like cough a bit worse lately but no fever/chills, no new/worsening SOB.   Tobacco abuse - unable to afford Chantix in the past. Would like to try this medication to help quit smoking.   Hyperthyroidism: due for labs.   Depression/Dysthymia/Anxiety: requests refill on mediations.   Past medical history, surgical history, social history and family history reviewed.  Patient Active Problem List   Diagnosis Date Noted  . Hyperlipidemia 02/23/2015  . Hard of hearing 01/23/2014  . COPD (chronic obstructive pulmonary disease) ([place]) 08/12/2013  . Tobacco abuse 06/12/2013  . Atherosclerosis of native arteries of extremity with intermittent claudication ([place]) 02/26/2013  . Claudication of right lower extremity ([place]) 01/03/2013  . Recurrent UTI 08/08/2012  . Right hip pain 08/08/2012  . GERD (gastroesophageal reflux disease) 06/16/2011  . Hypercholesteremia 04/16/2011  . Hyperthyroidism 03/27/2011  . DDD (degenerative disc disease), cervical 03/27/2011  . Bipolar disorder ([place]) 03/03/2011  . Brachial neuritis 10/25/2010  . Myalgia and myositis 10/25/2010  . Peptic ulcer 07/14/2009  . Anxiety state 12/03/2007  . Dysthymic disorder 11/04/2007  . Malignant neoplasm of skin 05/24/2007  . Rosacea 05/24/2007  . Glaucoma 05/24/2007  . Herpes zoster 05/13/2007    Current medication list and allergy/intolerance information reviewed.   Current Outpatient Prescriptions on File  Prior to Visit  Medication Sig Dispense Refill  . aspirin EC 81 MG tablet Take 1 tablet (81 mg total) by mouth daily. 90 tablet 3  . B Complex Vitamins (B COMPLEX PO) Take by mouth.    . Calcium-Magnesium-Zinc 500-250-12.5 MG TABS Take by mouth daily. TAKE 1 CAPSULE BY MOUTH 2 TIMES DAILY    . cilostazol (PLETAL) 100 MG tablet Take 1 tablet (100 mg total) by mouth 2 (two) times daily. Take 1 tablet by mouth two times a day 180 tablet 3  . cyclobenzaprine (FLEXERIL) 10 MG tablet Take 1 tablet (10 mg total) by mouth 3 (three) times daily as needed for muscle spasms. 90 tablet 2  . fish oil-omega-3 fatty acids 1000 MG capsule Take 2 g by mouth daily.    Marland Kitchen HYDROcodone-acetaminophen (NORCO) 10-325 MG tablet Take 1 tablet by mouth every 8 (eight) hours as needed. Use sparingly to prevent tolerance/dependence 90 tablet 0  . meloxicam (MOBIC) 15 MG tablet Take 15 mg by mouth daily.    Marland Kitchen omeprazole (PRILOSEC) 20 MG capsule     . polyethylene glycol (MIRALAX / GLYCOLAX) packet Take 17 g by mouth daily.      . pravastatin (PRAVACHOL) 40 MG tablet Take 1 tablet (40 mg total) by mouth daily. 90 tablet 3  . propylthiouracil (PTU) 50 MG tablet Take 50 mg by mouth 2 (two) times daily.     . ranitidine (ZANTAC) 300 MG tablet Take 1 tablet (300 mg total) by mouth at bedtime. 90 tablet 1   No current facility-administered medications on file prior to visit.    Allergies  Allergen Reactions  . Augmentin [Amoxicillin-Pot Clavulanate]  Vomiting  . Azithromycin   . Celexa [Citalopram Hydrobromide]   . Codeine   . Crestor [Rosuvastatin Calcium] Nausea Only  . Disalcid [Salsalate]   . Elavil [Amitriptyline Hcl]   . Erythromycin   . Lexapro [Escitalopram Oxalate]   . Livalo [Pitavastatin Calcium] Other (See Comments)    Headache.   Alvina Filbert [Hydrocodone-Acetaminophen]   . Methimazole   . Naproxen     nausea  . Neosporin [Neomycin-Bacitracin Zn-Polymyx]   . Neosporin [Neomycin-Polymyxin-Gramicidin]   .  Prozac [Fluoxetine]   . Talwin [Pentazocine]   . Tetracyclines & Related   . Wellbutrin [Bupropion] Other (See Comments)    constipation      Review of Systems:  Constitutional: No recent illness  HEENT: No  headache, no vision change  Cardiac: No  chest pain  Respiratory:  +shortness of breath. +Cough  Musculoskeletal: No new myalgia/arthralgia  Psychiatric: + concerns with depression, +concerns with anxiety  Exam:  BP 137/81   Pulse 77   Ht 5\' 2"  (1.575 m)   Wt 133 lb (60.3 kg)   BMI 24.33 kg/m   Constitutional: VS see above. General Appearance: alert, well-developed, well-nourished, NAD  Eyes: Normal lids and conjunctive, non-icteric sclera  Ears, Nose, Mouth, Throat: MMM, Normal external inspection ears/nares/mouth/lips/gums.  Neck: No masses, trachea midline.   Respiratory: Normal respiratory effort. no wheeze, no rhonchi, no rales. Diminished breath sounds  Cardiovascular: S1/S2 normal, no murmur, no rub/gallop auscultated. RRR.   Musculoskeletal: Gait normal. Symmetric and independent movement of all extremities  Neurological: Normal balance/coordination. No tremor.  Skin: warm, dry, intact.   Psychiatric: Normal judgment/insight. Normal mood and affect. Oriented x3.     Testing for oxygen therapy  O2 sat at rest on room air: 94 If this is 88% or below, stop, no further testing is needed. Test with exertion: O2 sat at exertion on room air: 94, then O2 sat at exertion on 2 L/min of O2: 96.      ASSESSMENT/PLAN:   Chronic obstructive pulmonary disease, unspecified COPD type ([place]) - Plan: Pneumococcal conjugate vaccine 13-valent, albuterol (PROVENTIL HFA;VENTOLIN HFA) 108 (90 Base) MCG/ACT inhaler, Tiotropium Bromide Monohydrate (SPIRIVA RESPIMAT) 2.5 MCG/ACT AERS, CBC with Differential/Platelet, COMPLETE METABOLIC PANEL WITH GFR, Lipid panel, Thyroid Panel With TSH  Chronic seasonal allergic rhinitis due to other allergen - Plan: fluticasone  (FLONASE) 50 MCG/ACT nasal spray  Tobacco abuse - Plan: Pneumococcal conjugate vaccine 13-valent, varenicline (CHANTIX STARTING MONTH PAK) 0.5 MG X 11 & 1 MG X 42 tablet, varenicline (CHANTIX CONTINUING MONTH PAK) 1 MG tablet  Hyperthyroidism - Plan: Thyroid Panel With TSH  Generalized anxiety disorder - Plan: ALPRAZolam (XANAX) 0.5 MG tablet  Dysthymic disorder  Need for influenza vaccination - Plan: Flu Vaccine QUAD 36+ mos IM    Patient Instructions  Plan:  Labs today - we will call you with these results   Plan to start Chantix  Flu shot and pneumonia shot today.  Plan to come back to the office in 3 months for medicare wellness visit with our RN and a visit with me after that to follow up on breathing  Please come back sooner if needed    Follow-up plan: Return in about 3 months (around 02/12/2017) for [place], SOONER IF NEEDED.  Visit summary with medication list and pertinent instructions was printed for patient to review, alert [place] if any changes needed. All questions at time of visit were answered - patient instructed to contact office with any additional concerns.  ER/RTC precautions were reviewed with the patient and understanding verbalized.

## 2016-11-16 ENCOUNTER — Telehealth: Payer: Self-pay | Admitting: Osteopathic Medicine

## 2016-11-16 NOTE — Telephone Encounter (Signed)
LM for pt that her AWV is scheduled for 4/9 at 1pm, [place]

## 2016-11-17 LAB — CBC WITH DIFFERENTIAL/PLATELET
Basophils Absolute: 0 cells/uL (ref 0–200)
Basophils Relative: 0 %
Eosinophils Absolute: 170 cells/uL (ref 15–500)
Eosinophils Relative: 2 %
HCT: 42.4 % (ref 35.0–45.0)
Hemoglobin: 13.9 g/dL (ref 11.7–15.5)
Lymphocytes Relative: 27 %
Lymphs Abs: 2295 cells/uL (ref 850–3900)
MCH: 29.2 pg (ref 27.0–33.0)
MCHC: 32.8 g/dL (ref 32.0–36.0)
MCV: 89.1 fL (ref 80.0–100.0)
MPV: 10.8 fL (ref 7.5–12.5)
Monocytes Absolute: 595 cells/uL (ref 200–950)
Monocytes Relative: 7 %
Neutro Abs: 5440 cells/uL (ref 1500–7800)
Neutrophils Relative %: 64 %
Platelets: 247 10*3/uL (ref 140–400)
RBC: 4.76 MIL/uL (ref 3.80–5.10)
RDW: 14.4 % (ref 11.0–15.0)
WBC: 8.5 10*3/uL (ref 3.8–10.8)

## 2016-11-18 LAB — LIPID PANEL
Cholesterol: 183 mg/dL (ref <200)
HDL: 64 mg/dL (ref >50)
LDL Cholesterol: 94 mg/dL (ref <100)
Total CHOL/HDL Ratio: 2.9 Ratio (ref <5.0)
Triglycerides: 123 mg/dL (ref <150)
VLDL: 25 mg/dL (ref <30)

## 2016-11-18 LAB — THYROID PANEL WITH TSH
Free Thyroxine Index: 2.8 (ref 1.4–3.8)
T3 Uptake: 24 % (ref 22–35)
T4, Total: 11.8 ug/dL (ref 4.5–12.0)
TSH: 2.24 mIU/L

## 2016-11-18 LAB — COMPLETE METABOLIC PANEL WITH GFR
ALT: 14 U/L (ref 6–29)
AST: 17 U/L (ref 10–35)
Albumin: 4.2 g/dL (ref 3.6–5.1)
Alkaline Phosphatase: 59 U/L (ref 33–130)
BUN: 16 mg/dL (ref 7–25)
CO2: 21 mmol/L (ref 20–31)
Calcium: 9.1 mg/dL (ref 8.6–10.4)
Chloride: 103 mmol/L (ref 98–110)
Creat: 1 mg/dL — ABNORMAL HIGH (ref 0.60–0.88)
GFR, Est African American: 61 mL/min (ref >=60)
GFR, Est Non African American: 53 mL/min — ABNORMAL LOW (ref >=60)
Glucose, Bld: 96 mg/dL (ref 65–99)
Potassium: 4.3 mmol/L (ref 3.5–5.3)
Sodium: 134 mmol/L — ABNORMAL LOW (ref 135–146)
Total Bilirubin: 0.7 mg/dL (ref 0.2–1.2)
Total Protein: 7.2 g/dL (ref 6.1–8.1)

## 2016-12-12 ENCOUNTER — Other Ambulatory Visit: Payer: Self-pay

## 2016-12-12 DIAGNOSIS — M5030 Other cervical disc degeneration, unspecified cervical region: Secondary | ICD-10-CM

## 2016-12-12 MED ORDER — CYCLOBENZAPRINE HCL 10 MG PO TABS: 10.0000 mg | ORAL_TABLET | Freq: Three times a day (TID) | ORAL | 0 refills | Status: DC | PRN

## 2016-12-12 NOTE — Telephone Encounter (Signed)
Patient request refill for Cyclobenaprine 10 mg. #[phone] refills sent to [facility].  ,CMA

## 2016-12-26 ENCOUNTER — Telehealth: Payer: Self-pay

## 2016-12-26 DIAGNOSIS — M5030 Other cervical disc degeneration, unspecified cervical region: Secondary | ICD-10-CM

## 2016-12-26 NOTE — Telephone Encounter (Signed)
Pt is requesting a refill on hydrocodone.  Please advise.

## 2016-12-27 MED ORDER — HYDROCODONE-ACETAMINOPHEN 10-325 MG PO TABS: 0.5000 | ORAL_TABLET | Freq: Three times a day (TID) | ORAL | 0 refills | Status: DC | PRN

## 2016-12-27 NOTE — Telephone Encounter (Signed)
SPOKE TO PATIENT ADVISED HER THAT RX IS READY FOR PICKUP.  ,CMA

## 2016-12-27 NOTE — Telephone Encounter (Signed)
Can let patient know this will be faxed to pharmacy  Clinical note: [place] database reviewed: last filled 11/02/16 x90 tablets

## 2017-01-11 ENCOUNTER — Telehealth: Payer: Self-pay

## 2017-01-11 DIAGNOSIS — K279 Peptic ulcer, site unspecified, unspecified as acute or chronic, without hemorrhage or perforation: Secondary | ICD-10-CM

## 2017-01-11 MED ORDER — RANITIDINE HCL 300 MG PO TABS: 300.0000 mg | ORAL_TABLET | Freq: Every day | ORAL | 3 refills | Status: DC

## 2017-01-11 NOTE — Telephone Encounter (Signed)
Patient request refill for Zantac 300 mg. #[phone] refills sent to [facility].  ,CMA

## 2017-01-15 ENCOUNTER — Ambulatory Visit: Payer: Commercial Managed Care - HMO

## 2017-01-22 DIAGNOSIS — H5203 Hypermetropia, bilateral: Secondary | ICD-10-CM | POA: Diagnosis not present

## 2017-02-08 ENCOUNTER — Ambulatory Visit (INDEPENDENT_AMBULATORY_CARE_PROVIDER_SITE_OTHER): Payer: Medicare HMO | Admitting: Osteopathic Medicine

## 2017-02-08 ENCOUNTER — Encounter: Payer: Self-pay | Admitting: Osteopathic Medicine

## 2017-02-08 VITALS — BP 117/85 | HR 99 | Wt 134.0 lb

## 2017-02-08 DIAGNOSIS — E870 Hyperosmolality and hypernatremia: Secondary | ICD-10-CM | POA: Diagnosis not present

## 2017-02-08 DIAGNOSIS — J449 Chronic obstructive pulmonary disease, unspecified: Secondary | ICD-10-CM | POA: Diagnosis not present

## 2017-02-08 DIAGNOSIS — R7989 Other specified abnormal findings of blood chemistry: Secondary | ICD-10-CM | POA: Diagnosis not present

## 2017-02-08 DIAGNOSIS — Z720 Tobacco use: Secondary | ICD-10-CM | POA: Diagnosis not present

## 2017-02-08 DIAGNOSIS — I7300 Raynaud's syndrome without gangrene: Secondary | ICD-10-CM | POA: Diagnosis not present

## 2017-02-08 MED ORDER — FLUTICASONE-SALMETEROL 45-21 MCG/ACT IN AERO: 2.0000 | INHALATION_SPRAY | Freq: Two times a day (BID) | RESPIRATORY_TRACT | 12 refills | Status: DC

## 2017-02-08 NOTE — Patient Instructions (Addendum)
Plan: 1. Ask your pharmacy about arranging a prior authorization for the Chantix  2. Will try adding an inhaler and see if that helps breathing 3. Recheck labs today 4. Plan to return for annual wellness visit in 3 months, come see me sooner if needed  5. See below re: Raynaud's disease    Raynaud Phenomenon Raynaud phenomenon is a condition that affects the blood vessels (arteries) that carry blood to your fingers and toes. The arteries that supply blood to your ears or the tip of your nose might also be affected. Raynaud phenomenon causes the arteries to temporarily narrow. As a result, the flow of blood to the affected areas is temporarily decreased. This usually occurs in response to cold temperatures or stress. During an attack, the skin in the affected areas turns white. You may also feel tingling or numbness in those areas. Attacks usually last for only a brief period, and then the blood flow to the area returns to normal. In most cases, Raynaud phenomenon does not cause serious health problems. What are the causes? For many people with this condition, the cause is not known. Raynaud phenomenon is sometimes associated with other diseases, such as scleroderma or lupus. What increases the risk? Raynaud phenomenon can affect anyone, but it develops most often in people who are 46-29 years old. It affects more females than males. What are the signs or symptoms? Symptoms of Raynaud phenomenon may occur when you are exposed to cold temperatures or when you have emotional stress. The symptoms may last for a few minutes or up to several hours. They usually affect your fingers but may also affect your toes, ears, or the tip of your nose. Symptoms may include:  Changes in skin color. The skin in the affected areas will turn pale or white. The skin may then change from white to bluish to red as normal blood flow returns to the area.  Numbness, tingling, or pain in the affected areas. In severe  cases, sores may develop in the affected areas. How is this diagnosed? Your health care provider will do a physical exam and take your medical history. You may be asked to put your hands in cold water to check for a reaction to cold temperature. Blood tests may be done to check for other diseases or conditions. Your health care provider may also order a test to check the movement of blood through your arteries and veins (vascular ultrasound). How is this treated? Treatment often involves making lifestyle changes and taking steps to control your exposure to cold temperatures. For more severe cases, medicine (calcium channel blockers) may be used to improve blood flow. Surgery is sometimes done to block the nerves that control the affected arteries, but this is rare. Follow these instructions at home:  Avoid exposure to cold by taking these steps:  If possible, stay indoors during cold weather.  When you go outside during cold weather, dress in layers and wear mittens, a hat, a scarf, and warm footwear.  Wear mittens or gloves when handling ice or frozen food.  Use holders for glasses or cans containing cold drinks.  Let warm water run for a while before taking a shower or bath.  Warm up the car before driving in cold weather.  If possible, avoid stressful and emotional situations. Exercise, meditation, and yoga may help you cope with stress. Biofeedback may be useful.  Do not use any tobacco products, including cigarettes, chewing tobacco, or electronic cigarettes. If you need help quitting,  ask your health care provider.  Avoid secondhand smoke.  Limit your use of caffeine. Switch to decaffeinated coffee, tea, and soda. Avoid chocolate.  Wear loose fitting socks and comfortable, roomy shoes.  Avoid vibrating tools and machinery.  Take medicines only as directed by your health care provider. Contact a health care provider if:  Your discomfort becomes worse despite lifestyle  changes.  You develop sores on your fingers or toes that do not heal.  Your fingers or toes turn black.  You have breaks in the skin on your fingers or toes.  You have a fever.  You have pain or swelling in your joints.  You have a rash.  Your symptoms occur on only one side of your body. This information is not intended to replace advice given to you by your health care provider. Make sure you discuss any questions you have with your health care provider. Document Released: 09/22/2000 Document Revised: 03/02/2016 Document Reviewed: 03/29/2016 Elsevier Interactive Patient Education  2017 [employer].

## 2017-02-08 NOTE — Progress Notes (Signed)
HPI: Heather Perkins is a 82 y.o. female  who presents to [facility] today, 02/08/17,  for chief complaint of:  Chief Complaint  Patient presents with  . Follow-up    COPD: Still smoking, was unable to get Chantix. States that her family pharmacy is incapable of initiating prior authorization process. Patient has tried nicotine replacement in the past which was ineffective, has tried Wellbutrin which cause serious side effects, patient states very bad constipation and GI upset. Patient denies difficulty breathing with activities of daily living though family certainly notices some shortness of breath. Patient's most recent visit, 6 minute walk test did not qualify her for oxygen. Reports chronic dry cough.  Skin: Occasionally fingers will turn pale and then can get a dusky blue color. This happened today in the office, see physical exam as noted below. Patient denies numbness/tingling, cold extremities.  Patient is accompanied by daughters who assists with history-taking.   Past medical history, surgical history, social history and family history reviewed.  Patient Active Problem List   Diagnosis Date Noted  . Hyperlipidemia 02/23/2015  . Hard of hearing 01/23/2014  . COPD (chronic obstructive pulmonary disease) ([place]) 08/12/2013  . Tobacco abuse 06/12/2013  . Atherosclerosis of native arteries of extremity with intermittent claudication ([place]) 02/26/2013  . Claudication of right lower extremity ([place]) 01/03/2013  . Recurrent UTI 08/08/2012  . Right hip pain 08/08/2012  . GERD (gastroesophageal reflux disease) 06/16/2011  . Hypercholesteremia 04/16/2011  . Hyperthyroidism 03/27/2011  . DDD (degenerative disc disease), cervical 03/27/2011  . Bipolar disorder ([place]) 03/03/2011  . Brachial neuritis 10/25/2010  . Myalgia and myositis 10/25/2010  . Peptic ulcer 07/14/2009  . Anxiety state 12/03/2007  . Dysthymic disorder 11/04/2007  . Malignant neoplasm of  skin 05/24/2007  . Rosacea 05/24/2007  . Glaucoma 05/24/2007  . Herpes zoster 05/13/2007    Current medication list and allergy/intolerance information reviewed.   Current Outpatient Prescriptions on File Prior to Visit  Medication Sig Dispense Refill  . albuterol (PROVENTIL HFA;VENTOLIN HFA) 108 (90 Base) MCG/ACT inhaler Inhale 2 puffs into the lungs every 6 (six) hours as needed for wheezing or shortness of breath. 1 Inhaler 3  . ALPRAZolam (XANAX) 0.5 MG tablet Take 0.5-1 tablets (0.25-0.5 mg total) by mouth 2 (two) times daily as needed (severe anxiety). 30 tablet 2  . aspirin EC 81 MG tablet Take 1 tablet (81 mg total) by mouth daily. 90 tablet 3  . B Complex Vitamins (B COMPLEX PO) Take by mouth.    . Calcium-Magnesium-Zinc 500-250-12.5 MG TABS Take by mouth daily. TAKE 1 CAPSULE BY MOUTH 2 TIMES DAILY    . cilostazol (PLETAL) 100 MG tablet Take 1 tablet (100 mg total) by mouth 2 (two) times daily. Take 1 tablet by mouth two times a day 180 tablet 3  . cyclobenzaprine (FLEXERIL) 10 MG tablet Take 1 tablet (10 mg total) by mouth 3 (three) times daily as needed for muscle spasms. 90 tablet 0  . fish oil-omega-3 fatty acids 1000 MG capsule Take 2 g by mouth daily.    . fluticasone (FLONASE) 50 MCG/ACT nasal spray Place 2 sprays into both nostrils daily. 16 g 11  . HYDROcodone-acetaminophen (NORCO) 10-325 MG tablet Take 0.5-1 tablets by mouth every 8 (eight) hours as needed. Use sparingly to prevent tolerance/dependence 90 tablet 0  . omeprazole (PRILOSEC) 20 MG capsule     . polyethylene glycol (MIRALAX / GLYCOLAX) packet Take 17 g by mouth daily.      Marland Kitchen  pravastatin (PRAVACHOL) 40 MG tablet Take 1 tablet (40 mg total) by mouth daily. 90 tablet 3  . propylthiouracil (PTU) 50 MG tablet Take 50 mg by mouth 2 (two) times daily.     . ranitidine (ZANTAC) 300 MG tablet Take 1 tablet (300 mg total) by mouth at bedtime. 90 tablet 3  . Tiotropium Bromide Monohydrate (SPIRIVA RESPIMAT) 2.5 MCG/ACT  AERS Two inhalations daily to help reduce cough. 1 Inhaler 4  . varenicline (CHANTIX CONTINUING MONTH PAK) 1 MG tablet Take 1 tablet (1 mg total) by mouth 2 (two) times daily. 30 tablet 1  . varenicline (CHANTIX STARTING MONTH PAK) 0.5 MG X 11 & 1 MG X 42 tablet Take one 0.5 mg tablet by mouth once daily for 3 days, then increase to one 0.5 mg tablet twice daily for 4 days, then increase to one 1 mg tablet twice daily. 42 tablet 0   No current facility-administered medications on file prior to visit.    Allergies  Allergen Reactions  . Augmentin [Amoxicillin-Pot Clavulanate]     Vomiting  . Azithromycin   . Celexa [Citalopram Hydrobromide]   . Codeine   . Crestor [Rosuvastatin Calcium] Nausea Only  . Disalcid [Salsalate]   . Elavil [Amitriptyline Hcl]   . Erythromycin   . Lexapro [Escitalopram Oxalate]   . Livalo [Pitavastatin Calcium] Other (See Comments)    Headache.   Alvina Filbert [Hydrocodone-Acetaminophen]   . Methimazole   . Naproxen     nausea  . Neosporin [Neomycin-Bacitracin Zn-Polymyx]   . Neosporin [Neomycin-Polymyxin-Gramicidin]   . Prozac [Fluoxetine]   . Talwin [Pentazocine]   . Tetracyclines & Related   . Wellbutrin [Bupropion] Other (See Comments)    constipation      Review of Systems:  Constitutional: No recent illness  HEENT: No  headache, no vision change  Cardiac: No  chest pain, No  pressure, No palpitations  Respiratory:  No  shortness of breath. +Cough  Musculoskeletal: No new myalgia/arthralgia  Skin: No  Rash  Neurologic: No  weakness, No  Dizziness   Exam:  BP 117/85   Pulse 99   Wt 134 lb (60.8 kg)   SpO2 97%   BMI 24.51 kg/m   Constitutional: VS see above. General Appearance: alert, well-developed, well-nourished, NAD  Eyes: Normal lids and conjunctive, non-icteric sclera  Ears, Nose, Mouth, Throat: MMM, Normal external inspection ears/nares/mouth/lips/gums.  Neck: No masses, trachea midline.   Respiratory: Normal respiratory  effort. no wheeze, no rhonchi, no rales  Cardiovascular: S1/S2 normal, no murmur, no rub/gallop auscultated. RRR.   Musculoskeletal: Gait normal. Symmetric and independent movement of all extremities  Neurological: Normal balance/coordination. No tremor.  Skin: warm, dry, intact. Dusky skin on toes and fingers, capillary refill 3-4 seconds, extremities are warm, pulses are normal. Patient denies pain/tenderness. By the end of the visit after confirming normal oxygen levels skin was back to normal color  Psychiatric: Normal judgment/insight. Normal mood and affect. Oriented x3.    Recent Results (from the past 2160 hour(s))  CBC with Differential/Platelet     Status: None   Collection Time: 11/15/16  2:00 PM  Result Value Ref Range   WBC 8.5 3.8 - 10.8 K/uL   RBC 4.76 3.80 - 5.10 MIL/uL   Hemoglobin 13.9 11.7 - 15.5 g/dL   HCT 42.4 35.0 - 45.0 %   MCV 89.1 80.0 - 100.0 fL   MCH 29.2 27.0 - 33.0 pg   MCHC 32.8 32.0 - 36.0 g/dL   RDW 14.4 11.0 -  15.0 %   Platelets 247 140 - 400 K/uL   MPV 10.8 7.5 - 12.5 fL   Neutro Abs 5,440 1,500 - 7,800 cells/uL   Lymphs Abs 2,295 850 - 3,900 cells/uL   Monocytes Absolute 595 200 - 950 cells/uL   Eosinophils Absolute 170 15 - 500 cells/uL   Basophils Absolute 0 0 - 200 cells/uL   Neutrophils Relative % 64 %   Lymphocytes Relative 27 %   Monocytes Relative 7 %   Eosinophils Relative 2 %   Basophils Relative 0 %   Smear Review Criteria for review not met   COMPLETE METABOLIC PANEL WITH GFR     Status: Abnormal   Collection Time: 11/15/16  2:00 PM  Result Value Ref Range   Sodium 134 (L) 135 - 146 mmol/L   Potassium 4.3 3.5 - 5.3 mmol/L   Chloride 103 98 - 110 mmol/L   CO2 21 20 - 31 mmol/L   Glucose, Bld 96 65 - 99 mg/dL   BUN 16 7 - 25 mg/dL   Creat 1.00 (H) 0.60 - 0.88 mg/dL    Comment:   For patients > or = 81 years of age: The upper reference limit for Creatinine is approximately 13% higher for people identified  as African-American.      Total Bilirubin 0.7 0.2 - 1.2 mg/dL   Alkaline Phosphatase 59 33 - 130 U/L   AST 17 10 - 35 U/L   ALT 14 6 - 29 U/L   Total Protein 7.2 6.1 - 8.1 g/dL   Albumin 4.2 3.6 - 5.1 g/dL   Calcium 9.1 8.6 - 10.4 mg/dL   GFR, Est African American 61 >=60 mL/min   GFR, Est Non African American 53 (L) >=60 mL/min  Lipid panel     Status: None   Collection Time: 11/15/16  2:00 PM  Result Value Ref Range   Cholesterol 183 <200 mg/dL   Triglycerides 123 <150 mg/dL   HDL 64 >50 mg/dL   Total CHOL/HDL Ratio 2.9 <5.0 Ratio   VLDL 25 <30 mg/dL   LDL Cholesterol 94 <100 mg/dL  Thyroid Panel With TSH     Status: None   Collection Time: 11/15/16  2:00 PM  Result Value Ref Range   T4, Total 11.8 4.5 - 12.0 ug/dL   T3 Uptake 24 22 - 35 %   Free Thyroxine Index 2.8 1.4 - 3.8   TSH 2.24 mIU/L    Comment:   Reference Range   > or = 20 Years  0.40-4.50   Pregnancy Range First trimester  0.26-2.66 Second trimester 0.55-2.73 Third trimester  0.43-2.91        ASSESSMENT/PLAN:   Seems unusual to me that their pharmacy won't initiate a prior authorization for Chantix this is something that is necessary. Advised if they would like to take prescription elsewhere, just let me know  Will trial addition of ICS/LABA to anticholinergic   Chronic obstructive pulmonary disease, unspecified COPD type ([place]) - Plan: fluticasone-salmeterol (ADVAIR HFA) 45-21 MCG/ACT inhaler, CBC with Differential/Platelet, COMPLETE METABOLIC PANEL WITH GFR  Tobacco abuse  Elevated serum creatinine - Plan: COMPLETE METABOLIC PANEL WITH GFR  Hypernatremia - Plan: COMPLETE METABOLIC PANEL WITH GFR  Raynaud's disease without gangrene    Patient Instructions  Plan: 1. Ask your pharmacy about arranging a prior authorization for the Chantix  2. Will try adding an inhaler and see if that helps breathing 3. Recheck labs today 4. Plan to return for annual wellness visit in  3 months, come see me  sooner if needed  5. See below re: Raynaud's disease        Follow-up plan: Return in about 3 months (around 05/11/2017) for [place], sooner if needed .  Visit summary with medication list and pertinent instructions was printed for patient to review, alert [place] if any changes needed. All questions at time of visit were answered - patient instructed to contact office with any additional concerns. ER/RTC precautions were reviewed with the patient and understanding verbalized.

## 2017-02-09 LAB — COMPLETE METABOLIC PANEL WITH GFR
ALT: 10 U/L (ref 6–29)
AST: 16 U/L (ref 10–35)
Albumin: 4.6 g/dL (ref 3.6–5.1)
Alkaline Phosphatase: 67 U/L (ref 33–130)
BUN: 12 mg/dL (ref 7–25)
CO2: 23 mmol/L (ref 20–31)
Calcium: 9.7 mg/dL (ref 8.6–10.4)
Chloride: 104 mmol/L (ref 98–110)
Creat: 1 mg/dL — ABNORMAL HIGH (ref 0.60–0.88)
GFR, Est African American: 61 mL/min (ref >=60)
GFR, Est Non African American: 53 mL/min — ABNORMAL LOW (ref >=60)
Glucose, Bld: 83 mg/dL (ref 65–99)
Potassium: 4.5 mmol/L (ref 3.5–5.3)
Sodium: 137 mmol/L (ref 135–146)
Total Bilirubin: 0.5 mg/dL (ref 0.2–1.2)
Total Protein: 7.6 g/dL (ref 6.1–8.1)

## 2017-02-09 LAB — CBC WITH DIFFERENTIAL/PLATELET
Basophils Absolute: 0 cells/uL (ref 0–200)
Basophils Relative: 0 %
Eosinophils Absolute: 154 cells/uL (ref 15–500)
Eosinophils Relative: 2 %
HCT: 45 % (ref 35.0–45.0)
Hemoglobin: 14.7 g/dL (ref 11.7–15.5)
Lymphocytes Relative: 38 %
Lymphs Abs: 2926 cells/uL (ref 850–3900)
MCH: 29.2 pg (ref 27.0–33.0)
MCHC: 32.7 g/dL (ref 32.0–36.0)
MCV: 89.3 fL (ref 80.0–100.0)
MPV: 11 fL (ref 7.5–12.5)
Monocytes Absolute: 462 cells/uL (ref 200–950)
Monocytes Relative: 6 %
Neutro Abs: 4158 cells/uL (ref 1500–7800)
Neutrophils Relative %: 54 %
Platelets: 298 10*3/uL (ref 140–400)
RBC: 5.04 MIL/uL (ref 3.80–5.10)
RDW: 14.7 % (ref 11.0–15.0)
WBC: 7.7 10*3/uL (ref 3.8–10.8)

## 2017-02-14 ENCOUNTER — Telehealth: Payer: Self-pay | Admitting: Osteopathic Medicine

## 2017-02-14 DIAGNOSIS — M5030 Other cervical disc degeneration, unspecified cervical region: Secondary | ICD-10-CM

## 2017-02-14 MED ORDER — HYDROCODONE-ACETAMINOPHEN 10-325 MG PO TABS: 0.5000 | ORAL_TABLET | Freq: Three times a day (TID) | ORAL | 0 refills | Status: DC | PRN

## 2017-02-14 NOTE — Telephone Encounter (Signed)
Signed Rx under Provider in office today.

## 2017-02-15 ENCOUNTER — Telehealth: Payer: Self-pay | Admitting: Emergency Medicine

## 2017-03-09 DIAGNOSIS — E0590 Thyrotoxicosis, unspecified without thyrotoxic crisis or storm: Secondary | ICD-10-CM | POA: Diagnosis not present

## 2017-04-14 ENCOUNTER — Other Ambulatory Visit: Payer: Self-pay | Admitting: Osteopathic Medicine

## 2017-04-14 DIAGNOSIS — M5030 Other cervical disc degeneration, unspecified cervical region: Secondary | ICD-10-CM

## 2017-04-19 ENCOUNTER — Other Ambulatory Visit: Payer: Self-pay | Admitting: Osteopathic Medicine

## 2017-04-19 ENCOUNTER — Telehealth: Payer: Self-pay | Admitting: Osteopathic Medicine

## 2017-04-19 DIAGNOSIS — Z720 Tobacco use: Secondary | ICD-10-CM

## 2017-04-19 MED ORDER — VARENICLINE TARTRATE 0.5 MG X 11 & 1 MG X 42 PO MISC
ORAL | 0 refills | Status: DC
Start: 2017-04-19 — End: 2017-05-15

## 2017-04-19 MED ORDER — VARENICLINE TARTRATE 1 MG PO TABS: 1.0000 mg | ORAL_TABLET | Freq: Two times a day (BID) | ORAL | 1 refills | Status: DC

## 2017-04-19 NOTE — Progress Notes (Signed)
Left VM for Pt's daughter advising she needs to complete form. Callback information provided for any questions.

## 2017-04-19 NOTE — Telephone Encounter (Signed)
In February, she was initially on my schedule for an annual wellness visit but we deferred this since she was having a lot of concerns with breathing that day, so I transitioned into a problem-based visit for insurance/billing purposes since insurance will not let us do both in the same day. Would recommend that she come in for her wellness visit when she has no other concerns to address

## 2017-04-19 NOTE — Progress Notes (Unsigned)
Reviewed insurance paperwork, there isn't anything for me to sign - I See records release on file, insurance company may ask [place] for additional information once they receive the forms from the patient. I reprinted the prescriptions in case they need these, there are actually a few portions of the paperwork that the patient has not completed. Left paperwork up front for them to pick up.

## 2017-04-19 NOTE — Telephone Encounter (Signed)
Dr A: Kathe Becton, pt's daughter called. At  Ms Randol last visit she was told to return for a Physical but she had a Physical already in Feb of this year. Is there anything else you would like her to follow up on?

## 2017-04-30 ENCOUNTER — Other Ambulatory Visit: Payer: Self-pay | Admitting: Osteopathic Medicine

## 2017-04-30 DIAGNOSIS — F411 Generalized anxiety disorder: Secondary | ICD-10-CM

## 2017-05-04 ENCOUNTER — Other Ambulatory Visit: Payer: Self-pay | Admitting: Osteopathic Medicine

## 2017-05-04 DIAGNOSIS — M5030 Other cervical disc degeneration, unspecified cervical region: Secondary | ICD-10-CM

## 2017-05-10 ENCOUNTER — Encounter: Payer: Medicare HMO | Admitting: Osteopathic Medicine

## 2017-05-11 ENCOUNTER — Encounter: Payer: Medicare HMO | Admitting: Osteopathic Medicine

## 2017-05-15 ENCOUNTER — Ambulatory Visit (INDEPENDENT_AMBULATORY_CARE_PROVIDER_SITE_OTHER): Payer: Medicare HMO | Admitting: Osteopathic Medicine

## 2017-05-15 ENCOUNTER — Encounter: Payer: Self-pay | Admitting: Osteopathic Medicine

## 2017-05-15 VITALS — BP 120/76 | HR 99 | Resp 18 | Wt 134.5 lb

## 2017-05-15 DIAGNOSIS — Z1389 Encounter for screening for other disorder: Secondary | ICD-10-CM | POA: Diagnosis not present

## 2017-05-15 DIAGNOSIS — M5030 Other cervical disc degeneration, unspecified cervical region: Secondary | ICD-10-CM

## 2017-05-15 DIAGNOSIS — Z1331 Encounter for screening for depression: Secondary | ICD-10-CM

## 2017-05-15 DIAGNOSIS — Z0000 Encounter for general adult medical examination without abnormal findings: Secondary | ICD-10-CM | POA: Diagnosis not present

## 2017-05-15 DIAGNOSIS — J449 Chronic obstructive pulmonary disease, unspecified: Secondary | ICD-10-CM

## 2017-05-15 DIAGNOSIS — Z720 Tobacco use: Secondary | ICD-10-CM | POA: Diagnosis not present

## 2017-05-15 MED ORDER — VARENICLINE TARTRATE 0.5 MG X 11 & 1 MG X 42 PO MISC: ORAL | 0 refills | Status: DC

## 2017-05-15 MED ORDER — HYDROCODONE-ACETAMINOPHEN 10-325 MG PO TABS
0.5000 | ORAL_TABLET | Freq: Three times a day (TID) | ORAL | 0 refills | Status: DC | PRN
Start: 2017-05-15 — End: 2017-07-25

## 2017-05-15 MED ORDER — VARENICLINE TARTRATE 1 MG PO TABS: 1.0000 mg | ORAL_TABLET | Freq: Two times a day (BID) | ORAL | 1 refills | Status: DC

## 2017-05-15 NOTE — Progress Notes (Signed)
HPI: Heather Perkins is a 83 y.o. female  who presents to [facility] today, 05/15/17,  for Medicare Annual Wellness Exam  Patient presents for annual physical/Medicare wellness exam. No other complaints today.  Advair causing waxy feeling in the mouth so she stopped this. Still breathing okay but requires some assistance with ADL, transport. Daughters are helpful. There were some issues getting Chantix   Past medical, surgical, social and family history reviewed:  Patient Active Problem List   Diagnosis Date Noted  . Hyperlipidemia 02/23/2015  . Hard of hearing 01/23/2014  . COPD (chronic obstructive pulmonary disease) ([place]) 08/12/2013  . Tobacco abuse 06/12/2013  . Atherosclerosis of native arteries of extremity with intermittent claudication ([place]) 02/26/2013  . Claudication of right lower extremity ([place]) 01/03/2013  . Recurrent UTI 08/08/2012  . Right hip pain 08/08/2012  . GERD (gastroesophageal reflux disease) 06/16/2011  . Hypercholesteremia 04/16/2011  . Hyperthyroidism 03/27/2011  . DDD (degenerative disc disease), cervical 03/27/2011  . Bipolar disorder ([place]) 03/03/2011  . Brachial neuritis 10/25/2010  . Myalgia and myositis 10/25/2010  . Peptic ulcer 07/14/2009  . Anxiety state 12/03/2007  . Dysthymic disorder 11/04/2007  . Malignant neoplasm of skin 05/24/2007  . Rosacea 05/24/2007  . Glaucoma 05/24/2007  . Herpes zoster 05/13/2007    Past Surgical History:  Procedure Laterality Date  . ABDOMINAL HYSTERECTOMY  1975   for dub  . APPENDECTOMY  1939  . BREAST BIOPSY  1954  . [place]   right  . CATARACT EXTRACTION  2009   right and left.   . CERVICAL FUSION    . [place]  . ORIF ANKLE FRACTURE  1979   left  . TONSILLECTOMY      Social History   Social History  . Marital status: Divorced    Spouse name: N/A  . Number of children: N/A  . Years of education: N/A   Occupational  History  . Not on file.   Social History Main Topics  . Smoking status: Current Every Day Smoker    Packs/day: 1.00    Years: 50.00    Types: Cigarettes  . Smokeless tobacco: Never Used     Comment: pt states that she has been trying to quit but can't seem to quit  . Alcohol use No  . Drug use: No  . Sexual activity: Not on file   Other Topics Concern  . Not on file   Social History Narrative  . No narrative on file    Family History  Problem Relation Age of Onset  . Hypertension Sister   . Cancer Daughter   . Hyperlipidemia Daughter      Current medication list and allergy/intolerance information reviewed:    Outpatient Encounter Prescriptions as of 05/15/2017  Medication Sig Note  . albuterol (PROVENTIL HFA;VENTOLIN HFA) 108 (90 Base) MCG/ACT inhaler Inhale 2 puffs into the lungs every 6 (six) hours as needed for wheezing or shortness of breath.   . ALPRAZolam (XANAX) 0.5 MG tablet TAKE 1/2 TO ONE TABLET BY MOUTH TWICE DAILY AS NEEDED (SEVERE ANXIETY)   . aspirin EC 81 MG tablet Take 1 tablet (81 mg total) by mouth daily.   . B Complex Vitamins (B COMPLEX PO) Take by mouth.   . Calcium-Magnesium-Zinc 500-250-12.5 MG TABS Take by mouth daily. TAKE 1 CAPSULE BY MOUTH 2 TIMES DAILY   . cilostazol (PLETAL) 100 MG tablet Take 1 tablet (100 mg total) by mouth  2 (two) times daily. Take 1 tablet by mouth two times a day   . cyclobenzaprine (FLEXERIL) 10 MG tablet TAKE ONE TABLET BY MOUTH 3 TIMES DAILY AS NEEDED FOR MUSCLE SPASMS   . fish oil-omega-3 fatty acids 1000 MG capsule Take 2 g by mouth daily.   . fluticasone (FLONASE) 50 MCG/ACT nasal spray Place 2 sprays into both nostrils daily.   Marland Kitchen HYDROcodone-acetaminophen (NORCO) 10-325 MG tablet Take 0.5-1 tablets by mouth every 8 (eight) hours as needed. Use sparingly to prevent tolerance/dependence   . omeprazole (PRILOSEC) 20 MG capsule  08/15/2016: Received from: External Pharmacy  . polyethylene glycol (MIRALAX / GLYCOLAX)  packet Take 17 g by mouth daily.     . pravastatin (PRAVACHOL) 40 MG tablet Take 1 tablet (40 mg total) by mouth daily.   Marland Kitchen propylthiouracil (PTU) 50 MG tablet Take 50 mg by mouth 2 (two) times daily.    . ranitidine (ZANTAC) 300 MG tablet Take 1 tablet (300 mg total) by mouth at bedtime.   . Tiotropium Bromide Monohydrate (SPIRIVA RESPIMAT) 2.5 MCG/ACT AERS Two inhalations daily to help reduce cough.   . fluticasone-salmeterol (ADVAIR HFA) 45-21 MCG/ACT inhaler Inhale 2 puffs into the lungs 2 (two) times daily. (Patient not taking: Reported on 05/15/2017)   . varenicline (CHANTIX CONTINUING MONTH PAK) 1 MG tablet Take 1 tablet (1 mg total) by mouth 2 (two) times daily. (Patient not taking: Reported on 05/15/2017)   . varenicline (CHANTIX STARTING MONTH PAK) 0.5 MG X 11 & 1 MG X 42 tablet Take one 0.5 mg tablet by mouth once daily for 3 days, then increase to one 0.5 mg tablet twice daily for 4 days, then increase to one 1 mg tablet twice daily. (Patient not taking: Reported on 05/15/2017)    No facility-administered encounter medications on file as of 05/15/2017.     Allergies  Allergen Reactions  . Augmentin [Amoxicillin-Pot Clavulanate]     Vomiting  . Azithromycin   . Celexa [Citalopram Hydrobromide]   . Codeine   . Crestor [Rosuvastatin Calcium] Nausea Only  . Disalcid [Salsalate]   . Elavil [Amitriptyline Hcl]   . Erythromycin   . Lexapro [Escitalopram Oxalate]   . Livalo [Pitavastatin Calcium] Other (See Comments)    Headache.   Alvina Filbert [Hydrocodone-Acetaminophen]   . Methimazole   . Naproxen     nausea  . Neosporin [Neomycin-Bacitracin Zn-Polymyx]   . Neosporin [Neomycin-Polymyxin-Gramicidin]   . Prozac [Fluoxetine]   . Talwin [Pentazocine]   . Tetracyclines & Related   . Wellbutrin [Bupropion] Other (See Comments)    constipation       Review of Systems: Review of Systems - Negative except chronic cough and arthritis   Medicare Wellness Questionnaire  Are there  smokers in your home (other than you)? yes  Depression Screen (Note: if answer to either of the following is "Yes", a more complete depression screening is indicated)   Q1: Over the past two weeks, have you felt down, depressed or hopeless? yes  Q2: Over the past two weeks, have you felt little interest or pleasure in doing things? yes  Have you lost interest or pleasure in daily life? yes  Do you often feel hopeless? yes  Do you cry easily over simple problems? yes  Activities of Daily Living In your present state of health, do you have any difficulty performing the following activities?:  Driving? yes Managing money?  yes Feeding yourself? no Getting from bed to chair? no Climbing a flight of  stairs? yes Preparing food and eating?: no Bathing or showering? no Getting dressed: no Getting to the toilet? no Using the toilet: no Moving around from place to place: yes In the past year have you fallen or had a near fall?: yes  Hearing Difficulties:  Do you often ask people to speak up or repeat themselves? yes Do you experience ringing or noises in your ears? yes  Do you have difficulty understanding soft or whispered voices? yes  Memory Difficulties:  Do you feel that you have a problem with memory? yes  Do you often misplace items? yes  Do you feel safe at home?  yes  Sexual Health:   Are you sexually active?  Yes  Do you have more than one partner?  No  Advanced Directives:   Advanced directives discussed: has an advanced directive - a copy HAS NOT been provided.  Additional information provided: no  Risk Factors  Current exercise habits: minimal  Dietary issues discussed:no major concerns  Cardiac risk factors: smoker, hypertension, generalized debilitation, COPD   Exam:  BP 120/76   Pulse 99   Resp 18   Wt 134 lb 8 oz (61 kg)   BMI 24.60 kg/m   Constitutional: VS see above. General Appearance: alert, well-developed, well-nourished, NAD  Ears, Nose, Mouth,  Throat: MMM  Neck: No masses, trachea midline.   Respiratory: Normal respiratory effort. no wheeze, no rhonchi, no rales  Cardiovascular:No lower extremity edema.   Musculoskeletal: Gait normal. No clubbing/cyanosis of digits.   Neurological: Normal balance/coordination. No tremor. Recalls 3 objects and able to read face of watch with correct time.   Skin: warm, dry, intact. No rash/ulcer.   Psychiatric: Normal judgment/insight. Normal mood and affect. Oriented x3.     ASSESSMENT/PLAN:   Encounter for Medicare annual wellness exam  Medicare annual wellness visit, subsequent  DDD (degenerative disc disease), cervical - Plan: HYDROcodone-acetaminophen (NORCO) 10-325 MG tablet  Tobacco abuse - Plan: varenicline (CHANTIX CONTINUING MONTH PAK) 1 MG tablet, varenicline (CHANTIX STARTING MONTH PAK) 0.5 MG X 11 & 1 MG X 42 tablet  Positive depression screening - Patient's daughters filled out form for her, she can't read it. She states she feels down since she is getting older but doesn't want to seek counseling/medicat  Chronic obstructive pulmonary disease, unspecified COPD type ([place]) - Discussed alternative inhalers aren't option, most important thing is going to be to quit smoking.   Immunization History  Administered Date(s) Administered  . Influenza,inj,Quad PF,36+ Mos 11/15/2016  . Pneumococcal Conjugate-13 11/15/2016  . Pneumococcal Polysaccharide-23 10/09/2008  . Tdap 03/27/2012   OTHER  Fall - exercise and Vit D age 51+ - needs  Advanced Directives -  Discussed as above   During the course of the visit the patient was educated and counseled about appropriate screening and preventive services as noted above.   Patient Instructions (the written plan) was given to the patient.  Medicare Attestation I have personally reviewed: The patient's medical and social history Their use of alcohol, tobacco or illicit drugs Their current medications and supplements The  patient's functional ability including ADLs,fall risks, home safety risks, cognitive, and hearing and visual impairment Diet and physical activities Evidence for depression or mood disorders  The patient's weight, height, BMI, and visual acuity have been recorded in the chart.  I have made referrals, counseling, and provided education to the patient based on review of the above and I have provided the patient with a written personalized care plan for preventive services.  Emeterio Reeve, DO   05/15/17   Visit summary with medication list and pertinent instructions was printed for patient to review. All questions at time of visit were answered - patient instructed to contact office with any additional concerns. ER/RTC precautions were reviewed with the patient. Follow-up plan: Return in about 3 months (around 08/15/2017) for recheck breathing .

## 2017-06-13 ENCOUNTER — Other Ambulatory Visit: Payer: Self-pay | Admitting: Osteopathic Medicine

## 2017-06-13 DIAGNOSIS — I739 Peripheral vascular disease, unspecified: Secondary | ICD-10-CM

## 2017-07-03 ENCOUNTER — Telehealth: Payer: Self-pay

## 2017-07-03 ENCOUNTER — Other Ambulatory Visit: Payer: Self-pay

## 2017-07-03 MED ORDER — OMEPRAZOLE 20 MG PO CPDR: 20.0000 mg | DELAYED_RELEASE_CAPSULE | Freq: Every day | ORAL | 2 refills | Status: DC

## 2017-07-03 NOTE — Telephone Encounter (Signed)
Pt's daughter is requesting a refill on omeprazole, it doesn't look like you have filled this in the past.  Please advise.

## 2017-07-03 NOTE — Telephone Encounter (Signed)
Sent!

## 2017-07-03 NOTE — Telephone Encounter (Signed)
Ok to refill 

## 2017-07-04 ENCOUNTER — Other Ambulatory Visit: Payer: Self-pay | Admitting: Osteopathic Medicine

## 2017-07-04 DIAGNOSIS — F411 Generalized anxiety disorder: Secondary | ICD-10-CM

## 2017-07-13 ENCOUNTER — Other Ambulatory Visit: Payer: Self-pay | Admitting: Osteopathic Medicine

## 2017-07-13 ENCOUNTER — Other Ambulatory Visit: Payer: Self-pay

## 2017-07-13 DIAGNOSIS — F411 Generalized anxiety disorder: Secondary | ICD-10-CM

## 2017-07-13 MED ORDER — ALPRAZOLAM 0.5 MG PO TABS: ORAL_TABLET | ORAL | 0 refills | Status: DC

## 2017-07-13 NOTE — Telephone Encounter (Signed)
Patient request refill for xanax. #[phone] refills printed and placed in Dr. Darene Lamer basket for signature. Rhonda Cunningham,CMA

## 2017-07-25 ENCOUNTER — Other Ambulatory Visit: Payer: Self-pay | Admitting: Osteopathic Medicine

## 2017-07-25 DIAGNOSIS — M5030 Other cervical disc degeneration, unspecified cervical region: Secondary | ICD-10-CM

## 2017-07-27 ENCOUNTER — Other Ambulatory Visit: Payer: Self-pay | Admitting: Osteopathic Medicine

## 2017-07-27 MED ORDER — HYDROCODONE-ACETAMINOPHEN 10-325 MG PO TABS: 0.5000 | ORAL_TABLET | Freq: Three times a day (TID) | ORAL | 0 refills | Status: DC | PRN

## 2017-07-27 NOTE — Telephone Encounter (Signed)
Last Rx 05/28/17 OK to refill. Due to follow up every 3 months for continued refills - keep appt to see me in November     Fill Date ID Written Drug Qty Days Prescriber Rx # Pharmacy Refill Daily Dose * Pymt Type PMP 05/28/2017 1 05/01/2017 ALPRAZOLAM 0.5 MG TABLET 30 15 Na Ale [id number]([id number]) 1 2.00 LME Comm Ins [place] 05/28/2017 1 05/15/2017 HYDROCODONE-ACETAMIN 10-325 MG 90 30 Na Ale [id number]([id number]) 0 30.00 MME Comm Ins [place] 05/01/2017 1 05/01/2017 ALPRAZOLAM 0.5 MG TABLET 30 15 Na Ale [id number] [facility]([id number]) 0 2.00 LME Comm Ins [place] 03/27/2017 1 01/01/2017 ALPRAZOLAM 0.5 MG TABLET 30 15 Na Ale [id number] [facility]([id number]) 2 2.00 LME Comm Ins [place] 02/24/2017 1 01/01/2017 ALPRAZOLAM 0.5 MG TABLET 30 15 Na Ale [id number] [facility]([id number]) 1 2.00 LME Comm Ins [place] 02/15/2017 1 02/15/2017 HYDROCODONE-ACETAMIN 10-325 MG 90 30 Ev Cor [id number] [facility]([id number]) 0 30.00 MME Comm Ins [place] 01/01/2017 1 12/27/2016 HYDROCODONE-ACETAMIN 10-325 MG 90 30 Na Ale [id number] [facility]([id number]) 0 30.00 MME Comm Ins [place] 01/01/2017 1 01/01/2017 ALPRAZOLAM 0.5 MG TABLET 30 15 Na Ale [id number]([id number]) 0

## 2017-07-30 NOTE — Telephone Encounter (Signed)
appt scheduled in November. 

## 2017-08-15 ENCOUNTER — Ambulatory Visit: Payer: Medicare HMO | Admitting: Osteopathic Medicine

## 2017-08-15 ENCOUNTER — Other Ambulatory Visit: Payer: Self-pay | Admitting: Sports Medicine

## 2017-08-15 ENCOUNTER — Encounter: Payer: Self-pay | Admitting: Osteopathic Medicine

## 2017-08-15 VITALS — BP 101/65 | HR 99 | Temp 98.2°F | Resp 18 | Wt 132.9 lb

## 2017-08-15 DIAGNOSIS — J449 Chronic obstructive pulmonary disease, unspecified: Secondary | ICD-10-CM | POA: Diagnosis not present

## 2017-08-15 DIAGNOSIS — Z720 Tobacco use: Secondary | ICD-10-CM

## 2017-08-15 DIAGNOSIS — E0590 Thyrotoxicosis, unspecified without thyrotoxic crisis or storm: Secondary | ICD-10-CM

## 2017-08-15 DIAGNOSIS — F411 Generalized anxiety disorder: Secondary | ICD-10-CM | POA: Diagnosis not present

## 2017-08-15 DIAGNOSIS — K589 Irritable bowel syndrome without diarrhea: Secondary | ICD-10-CM | POA: Diagnosis not present

## 2017-08-15 DIAGNOSIS — K279 Peptic ulcer, site unspecified, unspecified as acute or chronic, without hemorrhage or perforation: Secondary | ICD-10-CM | POA: Diagnosis not present

## 2017-08-15 DIAGNOSIS — Z23 Encounter for immunization: Secondary | ICD-10-CM | POA: Diagnosis not present

## 2017-08-15 MED ORDER — ALPRAZOLAM 0.5 MG PO TABS: ORAL_TABLET | ORAL | 0 refills | Status: DC

## 2017-08-15 MED ORDER — OMEPRAZOLE 20 MG PO CPDR: 20.0000 mg | DELAYED_RELEASE_CAPSULE | Freq: Two times a day (BID) | ORAL | 3 refills | Status: DC

## 2017-08-15 NOTE — Progress Notes (Signed)
HPI: Heather Perkins is a 83 y.o. female who  has a past medical history of Allergy, Cancer ([place]), Colitis, ischemic ([place]), Depression, Fibromyalgia, GERD (gastroesophageal reflux disease), Hyperthyroidism (2010), Peripheral vascular disease ([place]), and Shingles.  she presents to [facility] today, 08/15/17,  for chief complaint of:  Chief Complaint  Patient presents with  . Breathing Problem    3 mth follow up    Insomnia - new issue. Can get to sleep okay, but waking up every hour or so in the night.   GERD - okay overall but takes extra Omeprazole sometimes. Running out of medications early due to this.   COPD - breathing okay, taking inhalers in the evening seems to help. No new coughing. She is cutting back on smoking but finding she needs a Xanax now and then to help with the jitters.   Anxiety - I got a request for her Alprazolam refill. Looks like a Social worker also filled this while I was out of town but I didn't see it filled on the prescription monitoring program. Pt states she's not sure when her last fill was but has been cutting back on this medicine.   Following with GI next week Following with Endocrine next week   Patient is accompanied by daughter who assists with history-taking.   Past medical, surgical, social and family history reviewed:  Patient Active Problem List   Diagnosis Date Noted  . Hyperlipidemia 02/23/2015  . Hard of hearing 01/23/2014  . COPD (chronic obstructive pulmonary disease) ([place]) 08/12/2013  . Tobacco abuse 06/12/2013  . Atherosclerosis of native arteries of extremity with intermittent claudication ([place]) 02/26/2013  . Claudication of right lower extremity ([place]) 01/03/2013  . Recurrent UTI 08/08/2012  . Right hip pain 08/08/2012  . GERD (gastroesophageal reflux disease) 06/16/2011  . Hypercholesteremia 04/16/2011  . Hyperthyroidism 03/27/2011  . DDD (degenerative disc disease), cervical 03/27/2011  . Bipolar  disorder ([place]) 03/03/2011  . Brachial neuritis 10/25/2010  . Myalgia and myositis 10/25/2010  . Peptic ulcer 07/14/2009  . Anxiety state 12/03/2007  . Dysthymic disorder 11/04/2007  . Malignant neoplasm of skin 05/24/2007  . Rosacea 05/24/2007  . Glaucoma 05/24/2007  . Herpes zoster 05/13/2007    Past Surgical History:  Procedure Laterality Date  . ABDOMINAL HYSTERECTOMY  1975   for dub  . APPENDECTOMY  1939  . BREAST BIOPSY  1954  . [place]   right  . CATARACT EXTRACTION  2009   right and left.   . CERVICAL FUSION    . [place]  . ORIF ANKLE FRACTURE  1979   left  . TONSILLECTOMY      Social History   Tobacco Use  . Smoking status: Current Every Day Smoker    Packs/day: 1.00    Years: 50.00    Pack years: 50.00    Types: Cigarettes  . Smokeless tobacco: Never Used  . Tobacco comment: pt states that she has been trying to quit but can't seem to quit  Substance Use Topics  . Alcohol use: No    Family History  Problem Relation Age of Onset  . Hypertension Sister   . Cancer Daughter   . Hyperlipidemia Daughter      Current medication list and allergy/intolerance information reviewed:    Current Outpatient Medications  Medication Sig Dispense Refill  . albuterol (PROVENTIL HFA;VENTOLIN HFA) 108 (90 Base) MCG/ACT inhaler Inhale 2 puffs into the lungs every 6 (six) hours  as needed for wheezing or shortness of breath. 1 Inhaler 3  . ALPRAZolam (XANAX) 0.5 MG tablet TAKE 1/2-1 TABLET BY MOUTH TWICE DAILY AS NEEDED (SEVERE ANXIETY) #30 for thirty days 30 tablet 0  . aspirin EC 81 MG tablet Take 1 tablet (81 mg total) by mouth daily. 90 tablet 3  . B Complex Vitamins (B COMPLEX PO) Take by mouth.    . Calcium-Magnesium-Zinc 500-250-12.5 MG TABS Take by mouth daily. TAKE 1 CAPSULE BY MOUTH 2 TIMES DAILY    . cilostazol (PLETAL) 100 MG tablet TAKE ONE TABLET BY MOUTH TWICE DAILY 180 tablet 0  . cyclobenzaprine (FLEXERIL) 10 MG tablet  TAKE ONE TABLET BY MOUTH 3 TIMES DAILY AS NEEDED FOR MUSCLE SPASMS 90 tablet 0  . fish oil-omega-3 fatty acids 1000 MG capsule Take 2 g by mouth daily.    . fluticasone (FLONASE) 50 MCG/ACT nasal spray Place 2 sprays into both nostrils daily. 16 g 11  . HYDROcodone-acetaminophen (NORCO) 10-325 MG tablet Take 0.5-1 tablets by mouth every 8 (eight) hours as needed. Use sparingly to prevent tolerance/dependence 90 tablet 0  . omeprazole (PRILOSEC) 20 MG capsule Take 1 capsule (20 mg total) by mouth daily. 30 capsule 2  . polyethylene glycol (MIRALAX / GLYCOLAX) packet Take 17 g by mouth daily.      . pravastatin (PRAVACHOL) 40 MG tablet TAKE ONE TABLET BY MOUTH DAILY 90 tablet 0  . propylthiouracil (PTU) 50 MG tablet Take 50 mg by mouth 2 (two) times daily.     . ranitidine (ZANTAC) 300 MG tablet Take 1 tablet (300 mg total) by mouth at bedtime. 90 tablet 3  . Tiotropium Bromide Monohydrate (SPIRIVA RESPIMAT) 2.5 MCG/ACT AERS Two inhalations daily to help reduce cough. 1 Inhaler 4  . varenicline (CHANTIX CONTINUING MONTH PAK) 1 MG tablet Take 1 tablet (1 mg total) by mouth 2 (two) times daily. 30 tablet 1  . varenicline (CHANTIX STARTING MONTH PAK) 0.5 MG X 11 & 1 MG X 42 tablet Take one 0.5 mg tablet by mouth once daily for 3 days, then increase to one 0.5 mg tablet twice daily for 4 days, then increase to one 1 mg tablet twice daily. 42 tablet 0  . fluticasone-salmeterol (ADVAIR HFA) 45-21 MCG/ACT inhaler Inhale 2 puffs into the lungs 2 (two) times daily. (Patient not taking: Reported on 05/15/2017) 1 Inhaler 12   No current facility-administered medications for this visit.     Allergies  Allergen Reactions  . Augmentin [Amoxicillin-Pot Clavulanate]     Vomiting  . Azithromycin   . Celexa [Citalopram Hydrobromide]   . Codeine   . Crestor [Rosuvastatin Calcium] Nausea Only  . Disalcid [Salsalate]   . Elavil [Amitriptyline Hcl]   . Erythromycin   . Lexapro [Escitalopram Oxalate]   . Livalo  [Pitavastatin Calcium] Other (See Comments)    Headache.   Alvina Filbert [Hydrocodone-Acetaminophen]   . Methimazole   . Naproxen     nausea  . Neosporin [Neomycin-Bacitracin Zn-Polymyx]   . Neosporin [Neomycin-Polymyxin-Gramicidin]   . Prozac [Fluoxetine]   . Talwin [Pentazocine]   . Tetracyclines & Related   . Wellbutrin [Bupropion] Other (See Comments)    constipation      Review of Systems:  Constitutional:  No  fever, no chills, No recent illness  HEENT: No  headache, no vision change  Cardiac: No  chest pain, No  pressure  Respiratory:  No  shortness of breath. +Cough  Gastrointestinal: +epigastric abdominal pain, No  nausea, No  vomiting,  No  blood in stool, No  diarrhea, No  constipation   Musculoskeletal: No new myalgia/arthralgia  Skin: No  Rash,  Neurologic: No  weakness, No  dizziness,   Psychiatric: No  concerns with depression, +concerns with anxiety, +sleep problems, No mood problems  Exam:  BP 101/65 (BP Location: Right Arm, Patient Position: Sitting, Cuff Size: Large)   Pulse 99   Temp 98.2 F (36.8 C) (Oral)   Resp 18   Wt 132 lb 14.4 oz (60.3 kg)   SpO2 93%   BMI 24.31 kg/m   Constitutional: VS see above. General Appearance: alert, well-developed, well-nourished, NAD  Eyes: Normal lids and conjunctive, non-icteric sclera  Ears, Nose, Mouth, Throat: MMM, Normal external inspection ears/nares/mouth/lips/gums.   Neck: No masses, trachea midline. No thyroid enlargement. No tenderness/mass appreciated. No lymphadenopathy  Respiratory: Normal respiratory effort. no wheeze, no rhonchi, no rales, diminished breath sounds bilaterally   Cardiovascular: S1/S2 normal, no murmur, no rub/gallop auscultated. RRR.  Gastrointestinal: Nontender, no masses.   Musculoskeletal: Gait normal.   Neurological: Normal balance/coordination. No tremor.   Skin: warm, dry, intact. No rash/ulcer.  Psychiatric: Normal judgment/insight. Normal mood and affect.  Oriented x3.    ASSESSMENT/PLAN:   Chronic obstructive pulmonary disease, unspecified COPD type ([place]) - doing well on inhalers, cutting back on cigarettes  Generalized anxiety disorder - Plan: ALPRAZolam (XANAX) 0.5 MG tablet  Tobacco abuse - will see if we can check up on Chantix status   Peptic ulcer disease  Spasm of bowel - seeing GI next week   Hyperthyroidism - following with endocrine next week   Flu vaccine need - Plan: Flu vaccine HIGH DOSE PF (Fluzone High dose)    Patient Instructions  Plan:  OK to continue the Xanax with plans to cut back to only as-needed use.   Will see what GI has to say about this stomach issues. For now, twice-daily Omeprazole plus once-daily Ranitidine is OK to continue     Visit summary with medication list and pertinent instructions was printed for patient to review. All questions at time of visit were answered - patient instructed to contact office with any additional concerns. ER/RTC precautions were reviewed with the patient. Follow-up plan: Return in about 6 months (around 02/12/2018) for recheck COPD and other chronic issues, sooner if needed .  Note: Total time spent 25 minutes, greater than 50% of the visit was spent face-to-face counseling and coordinating care for the following: The primary encounter diagnosis was Chronic obstructive pulmonary disease, unspecified COPD type ([place]). Diagnoses of Generalized anxiety disorder, Tobacco abuse, Peptic ulcer disease, Spasm of bowel, Hyperthyroidism, and Flu vaccine need were also pertinent to this visit.Marland Kitchen  Please note: voice recognition software was used to produce this document, and typos may escape review. Please contact Dr. Sheppard Coil for any needed clarifications.

## 2017-08-15 NOTE — Telephone Encounter (Signed)
Please call patient: alprazolam faxed to [place] Can we confirm if she filled the prescription written in October by Dr T while I was out, and which pharmacy she used? It's not in the database.      05/28/2017 1  05/15/2017 Hydrocodone-Acetamin 10-325 MG 90 30 Na Ale [id number] [facility] ([id number]) 0 30.00 MME Comm Ins [place] 05/28/2017 1  05/01/2017 Alprazolam 0.5 MG Tablet 30 15 Na Ale [id number] [facility] ([id number]) 1 2.00 LME Comm Ins [place] 05/01/2017 1  05/01/2017 Alprazolam 0.5 MG Tablet 30 15 Na Ale [id number] [facility] ([id number]) 0 2.00 LME Comm Ins [place] 03/27/2017 1  01/01/2017 Alprazolam 0.5 MG Tablet 30 15 Na Ale [id number] [facility] ([id number]) 2 2.00 LME Comm Ins [place] 02/24/2017 1  01/01/2017 Alprazolam 0.5 MG Tablet 30 15 Na Ale [id number] [facility] ([id number]) 1 2.00 LME Comm Ins [place] 02/15/2017 1  02/15/2017 Hydrocodone-Acetamin 10-325 MG 90 30 Ev Cor [id number] [facility] ([id number]) 0 30.00 MME Comm Ins [place] 01/01/2017 1  12/27/2016 Hydrocodone-Acetamin 10-325 MG 90 30 Na Ale [id number] [facility] ([id number]) 0 30.00 MME Comm Ins [place] 01/01/2017 1  01/01/2017 Alprazolam 0.5 MG Tablet 30 15 Na Ale [id number] [facility] ([id number]) 0 2.00 LME Comm Ins [place]

## 2017-08-15 NOTE — Patient Instructions (Addendum)
Plan:  OK to continue the Xanax with plans to cut back to only as-needed use.   Will see what GI has to say about this stomach issues. For now, twice-daily Omeprazole plus once-daily Ranitidine is OK to continue

## 2017-08-15 NOTE — Telephone Encounter (Signed)
To PCP

## 2017-08-16 NOTE — Telephone Encounter (Signed)
[place] - confirmed/verified patient did pick up Xanax in October. Verbally approved for medication refill with no additional refills.

## 2017-08-17 NOTE — Telephone Encounter (Signed)
Patient called thinking she had missed a call from [place]; questioned provider and nurse and no such call made; there was a call from her pharmacy.

## 2017-08-21 DIAGNOSIS — K5900 Constipation, unspecified: Secondary | ICD-10-CM | POA: Diagnosis not present

## 2017-08-21 DIAGNOSIS — K210 Gastro-esophageal reflux disease with esophagitis: Secondary | ICD-10-CM | POA: Diagnosis not present

## 2017-08-21 DIAGNOSIS — R1013 Epigastric pain: Secondary | ICD-10-CM | POA: Diagnosis not present

## 2017-08-22 ENCOUNTER — Other Ambulatory Visit: Payer: Self-pay | Admitting: Osteopathic Medicine

## 2017-08-22 DIAGNOSIS — M5030 Other cervical disc degeneration, unspecified cervical region: Secondary | ICD-10-CM

## 2017-09-05 DIAGNOSIS — R1013 Epigastric pain: Secondary | ICD-10-CM | POA: Diagnosis not present

## 2017-09-05 DIAGNOSIS — K210 Gastro-esophageal reflux disease with esophagitis: Secondary | ICD-10-CM | POA: Diagnosis not present

## 2017-09-05 DIAGNOSIS — K449 Diaphragmatic hernia without obstruction or gangrene: Secondary | ICD-10-CM | POA: Diagnosis not present

## 2017-09-05 DIAGNOSIS — R0789 Other chest pain: Secondary | ICD-10-CM | POA: Diagnosis not present

## 2017-09-13 DIAGNOSIS — E0590 Thyrotoxicosis, unspecified without thyrotoxic crisis or storm: Secondary | ICD-10-CM | POA: Diagnosis not present

## 2017-09-13 DIAGNOSIS — M810 Age-related osteoporosis without current pathological fracture: Secondary | ICD-10-CM | POA: Diagnosis not present

## 2017-10-10 DIAGNOSIS — L821 Other seborrheic keratosis: Secondary | ICD-10-CM | POA: Diagnosis not present

## 2017-10-10 DIAGNOSIS — L570 Actinic keratosis: Secondary | ICD-10-CM | POA: Diagnosis not present

## 2017-10-11 ENCOUNTER — Other Ambulatory Visit: Payer: Self-pay | Admitting: Osteopathic Medicine

## 2017-10-11 DIAGNOSIS — M5030 Other cervical disc degeneration, unspecified cervical region: Secondary | ICD-10-CM

## 2017-10-12 NOTE — Telephone Encounter (Signed)
Is it ok to refill this medication. Please advise.

## 2017-10-16 ENCOUNTER — Other Ambulatory Visit: Payer: Self-pay | Admitting: Osteopathic Medicine

## 2017-10-16 ENCOUNTER — Telehealth: Payer: Self-pay

## 2017-10-16 DIAGNOSIS — I739 Peripheral vascular disease, unspecified: Secondary | ICD-10-CM

## 2017-10-16 DIAGNOSIS — M5030 Other cervical disc degeneration, unspecified cervical region: Secondary | ICD-10-CM

## 2017-10-16 MED ORDER — CILOSTAZOL 100 MG PO TABS: 100.0000 mg | ORAL_TABLET | Freq: Two times a day (BID) | ORAL | 3 refills | Status: DC

## 2017-10-16 MED ORDER — HYDROCODONE-ACETAMINOPHEN 10-325 MG PO TABS: 0.5000 | ORAL_TABLET | Freq: Three times a day (TID) | ORAL | 0 refills | Status: DC | PRN

## 2017-10-16 NOTE — Telephone Encounter (Signed)
Left vm msg for pt's daughter to return call back regarding medications refills.

## 2017-10-16 NOTE — Telephone Encounter (Signed)
Pt's daughter informed of PCP's note and has been transferred to front desk for scheduling. No other inquiries during phone call.

## 2017-10-16 NOTE — Telephone Encounter (Signed)
Sent meds to [facility] requires every 3 months follow up for continuation of controlled substance pain medications, her last visit was 08/15/17 so she will be due for an appointment around 11/15/17 or whenever she needs another pain medication refill.   PMP reviewed 08/22/2017 1  08/22/2017 Alprazolam 0.5 MG Tablet 30 15 Na Ale [id number] [facility] ([id number]) 0 2.00 LME Comm Ins [place] 08/01/2017 1  08/01/2017 Hydrocodone-Acetamin 10-325 MG 90 30 Na Ale [id number] [facility] ([id number]) 0 30.00 MME Comm Ins [place] 07/20/2017 1  07/13/2017 Alprazolam 0.5 MG Tablet 30 15 Mo Con [id number] [facility] ([id number]) 0 2.00 LME Comm Ins [place] 07/05/2017 1  07/05/2017 Alprazolam 0.5 MG Tablet 30 15 Na Ale [id number] [facility] ([id number]) 0 2.00 LME Comm Ins [place] 05/28/2017 1  05/15/2017 Hydrocodone-Acetamin 10-325 MG 90 30 Na Ale [id number] [facility] ([id number]) 0 30.00 MME Comm Ins [place]

## 2017-10-16 NOTE — Telephone Encounter (Signed)
Pt's daughter called today requesting refills for cilostazol and hydrocodone-ace to be sent to [facility]. Pt is currently out of these medications. Pls advise, thanks.

## 2017-10-29 ENCOUNTER — Other Ambulatory Visit: Payer: Self-pay | Admitting: Osteopathic Medicine

## 2017-11-15 ENCOUNTER — Ambulatory Visit (INDEPENDENT_AMBULATORY_CARE_PROVIDER_SITE_OTHER): Payer: Medicare HMO | Admitting: Osteopathic Medicine

## 2017-11-15 ENCOUNTER — Encounter: Payer: Self-pay | Admitting: Osteopathic Medicine

## 2017-11-15 VITALS — BP 117/57 | HR 102 | Temp 98.2°F | Wt 133.0 lb

## 2017-11-15 DIAGNOSIS — Z79899 Other long term (current) drug therapy: Secondary | ICD-10-CM | POA: Diagnosis not present

## 2017-11-15 DIAGNOSIS — M5030 Other cervical disc degeneration, unspecified cervical region: Secondary | ICD-10-CM

## 2017-11-15 DIAGNOSIS — R52 Pain, unspecified: Secondary | ICD-10-CM | POA: Diagnosis not present

## 2017-11-15 DIAGNOSIS — M25551 Pain in right hip: Secondary | ICD-10-CM

## 2017-11-15 MED ORDER — CYCLOBENZAPRINE HCL 10 MG PO TABS: ORAL_TABLET | ORAL | 2 refills | Status: DC

## 2017-11-15 NOTE — Progress Notes (Signed)
HPI: Heather Perkins is a 84 y.o. female who  has a past medical history of Allergy, Cancer ([place]), Colitis, ischemic ([place]), Depression, Fibromyalgia, GERD (gastroesophageal reflux disease), Hyperthyroidism (2010), Peripheral vascular disease ([place]), and Shingles.  she presents to [facility] today, 11/15/17,  for chief complaint of:  Refill pain medications - 3 months follow-up   Controlled substances currently prescribed: Hydrocodone-APAP 10-325 #90, last filled 10/16/2017, prior to that was 08/01/2017, she is here for routine 3 months follow-up. Rx prescribed by myself, Emeterio Reeve DO, for diagnosis cervical degenerative disc disease w/ radiculopathy, hip pain. Last refill sent 10/14/2017 for 90 tablets take 0.5-1 tablet q8 h prn  [place] controlled substance database reviewed, is consistent with the above history.  Controlled substance contract on file as of 11/2016, renewed today        Past medical history, surgical history, social history and family history reviewed. No updates needed.   Current medication list and allergy/intolerance information reviewed.    Current Outpatient Medications on File Prior to Visit  Medication Sig Dispense Refill  . albuterol (PROVENTIL HFA;VENTOLIN HFA) 108 (90 Base) MCG/ACT inhaler Inhale 2 puffs into the lungs every 6 (six) hours as needed for wheezing or shortness of breath. 1 Inhaler 3  . ALPRAZolam (XANAX) 0.5 MG tablet Take 1 tablet (0.5 mg total) by mouth 2 (two) times daily as needed for anxiety. #30 for 30 days 30 tablet 1  . aspirin EC 81 MG tablet Take 1 tablet (81 mg total) by mouth daily. 90 tablet 3  . B Complex Vitamins (B COMPLEX PO) Take by mouth.    . Calcium-Magnesium-Zinc 500-250-12.5 MG TABS Take by mouth daily. TAKE 1 CAPSULE BY MOUTH 2 TIMES DAILY    . cilostazol (PLETAL) 100 MG tablet Take 1 tablet (100 mg total) by mouth 2 (two) times daily. 180 tablet 3  . cyclobenzaprine (FLEXERIL) 10 MG  tablet TAKE ONE TABLET BY MOUTH 3 TIMES DAILY AS NEEDED FOR MUSCLE SPASMS 90 tablet 0  . fish oil-omega-3 fatty acids 1000 MG capsule Take 2 g by mouth daily.    . fluticasone (FLONASE) 50 MCG/ACT nasal spray Place 2 sprays into both nostrils daily. 16 g 11  . HYDROcodone-acetaminophen (NORCO) 10-325 MG tablet Take 0.5-1 tablets by mouth every 8 (eight) hours as needed. Use sparingly to prevent tolerance/dependence 90 tablet 0  . omeprazole (PRILOSEC) 20 MG capsule Take 1 capsule (20 mg total) 2 (two) times daily before a meal by mouth. 180 capsule 3  . polyethylene glycol (MIRALAX / GLYCOLAX) packet Take 17 g by mouth daily.      . pravastatin (PRAVACHOL) 40 MG tablet TAKE ONE TABLET BY MOUTH DAILY 90 tablet 0  . propylthiouracil (PTU) 50 MG tablet Take 50 mg by mouth 2 (two) times daily.     . ranitidine (ZANTAC) 300 MG tablet Take 1 tablet (300 mg total) by mouth at bedtime. 90 tablet 3  . Tiotropium Bromide Monohydrate (SPIRIVA RESPIMAT) 2.5 MCG/ACT AERS Two inhalations daily to help reduce cough. 1 Inhaler 4  . varenicline (CHANTIX CONTINUING MONTH PAK) 1 MG tablet Take 1 tablet (1 mg total) by mouth 2 (two) times daily. 30 tablet 1  . varenicline (CHANTIX STARTING MONTH PAK) 0.5 MG X 11 & 1 MG X 42 tablet Take one 0.5 mg tablet by mouth once daily for 3 days, then increase to one 0.5 mg tablet twice daily for 4 days, then increase to one 1 mg tablet twice daily.  42 tablet 0   No current facility-administered medications on file prior to visit.    Allergies  Allergen Reactions  . Augmentin [Amoxicillin-Pot Clavulanate]     Vomiting  . Azithromycin   . Celexa [Citalopram Hydrobromide]   . Codeine   . Crestor [Rosuvastatin Calcium] Nausea Only  . Disalcid [Salsalate]   . Elavil [Amitriptyline Hcl]   . Erythromycin   . Lexapro [Escitalopram Oxalate]   . Livalo [Pitavastatin Calcium] Other (See Comments)    Headache.   . Methimazole   . Naproxen     nausea  . Neosporin  [Neomycin-Bacitracin Zn-Polymyx]   . Neosporin [Neomycin-Polymyxin-Gramicidin]   . Prozac [Fluoxetine]   . Talwin [Pentazocine]   . Tetracyclines & Related   . Wellbutrin [Bupropion] Other (See Comments)    constipation      Review of Systems:  Constitutional: No recent illness  HEENT: No  headache, no vision change  Cardiac: No  chest pain  Respiratory:  No  shortness of breath.  Gastrointestinal: No  abdominal pain, no change on bowel habits  Musculoskeletal: No new myalgia/arthralgia  Skin: No  Rash  Neurologic: No  weakness, No  Dizziness  Psychiatric: No  concerns with depression, No  concerns with anxiety  Exam:  BP (!) 117/57   Pulse (!) 102   Temp 98.2 F (36.8 C) (Oral)   Wt 133 lb 0.6 oz (60.3 kg)   BMI 24.33 kg/m   Constitutional: VS see above. General Appearance: alert, well-developed, well-nourished, NAD  Eyes: Normal lids and conjunctive, non-icteric sclera  Neck: No masses, trachea midline.   Respiratory: Normal respiratory effort. no wheeze, no rhonchi, no rales  Cardiovascular: S1/S2 normal, no murmur, no rub/gallop auscultated. RRR.   Musculoskeletal: Gait normal. Symmetric and independent movement of all extremities  Neurological: Normal balance/coordination. No tremor.  Skin: warm, dry, intact.   Psychiatric: Normal judgment/insight. Normal mood and affect. Oriented x3.     ASSESSMENT/PLAN:   DDD (degenerative disc disease), cervical - Plan: cyclobenzaprine (FLEXERIL) 10 MG tablet  Right hip pain  Pain management  Controlled substance agreement signed   Meds ordered this encounter  Medications  . cyclobenzaprine (FLEXERIL) 10 MG tablet    Sig: TAKE ONE TABLET BY MOUTH 3 TIMES DAILY AS NEEDED FOR MUSCLE SPASMS    Dispense:  90 tablet    Refill:  2      Follow-up plan: Return in about 3 months (around 02/12/2018) for FASTING ROUTINE LABS, ROUTINE PAIN MEDICATION FOLLOW-UP .  Visit summary with medication list and  pertinent instructions was printed for patient to review, alert [place] if any changes needed. All questions at time of visit were answered - patient instructed to contact office with any additional concerns. ER/RTC precautions were reviewed with the patient and understanding verbalized.   Note: Total time spent 25 minutes, greater than 50% of the visit was spent face-to-face counseling and coordinating care for the following: The primary encounter diagnosis was DDD (degenerative disc disease), cervical. Diagnoses of Right hip pain, Pain management, and Controlled substance agreement signed were also pertinent to this visit.Marland Kitchen  Please note: voice recognition software was used to produce this document, and typos may escape review. Please contact Dr. Sheppard Coil for any needed clarifications.

## 2017-12-04 ENCOUNTER — Other Ambulatory Visit: Payer: Self-pay | Admitting: Osteopathic Medicine

## 2017-12-04 DIAGNOSIS — J449 Chronic obstructive pulmonary disease, unspecified: Secondary | ICD-10-CM

## 2017-12-04 NOTE — Telephone Encounter (Signed)
Pharmacy requesting medications refills. Thanks.

## 2017-12-17 ENCOUNTER — Other Ambulatory Visit: Payer: Self-pay | Admitting: Osteopathic Medicine

## 2017-12-24 ENCOUNTER — Other Ambulatory Visit: Payer: Self-pay | Admitting: Osteopathic Medicine

## 2017-12-24 DIAGNOSIS — M5030 Other cervical disc degeneration, unspecified cervical region: Secondary | ICD-10-CM

## 2017-12-24 NOTE — Telephone Encounter (Signed)
Pharmacy requesting med refill. Thanks.

## 2017-12-25 NOTE — Telephone Encounter (Signed)
Pt updated regarding med refill sent to [facility].

## 2018-02-13 ENCOUNTER — Ambulatory Visit: Payer: Medicare HMO | Admitting: Osteopathic Medicine

## 2018-02-20 ENCOUNTER — Ambulatory Visit (INDEPENDENT_AMBULATORY_CARE_PROVIDER_SITE_OTHER): Payer: Medicare HMO | Admitting: Osteopathic Medicine

## 2018-02-20 ENCOUNTER — Encounter: Payer: Self-pay | Admitting: Osteopathic Medicine

## 2018-02-20 VITALS — BP 122/52 | HR 115 | Temp 97.9°F | Wt 135.1 lb

## 2018-02-20 DIAGNOSIS — I70219 Atherosclerosis of native arteries of extremities with intermittent claudication, unspecified extremity: Secondary | ICD-10-CM | POA: Diagnosis not present

## 2018-02-20 DIAGNOSIS — E7800 Pure hypercholesterolemia, unspecified: Secondary | ICD-10-CM | POA: Diagnosis not present

## 2018-02-20 DIAGNOSIS — E0590 Thyrotoxicosis, unspecified without thyrotoxic crisis or storm: Secondary | ICD-10-CM

## 2018-02-20 DIAGNOSIS — R52 Pain, unspecified: Secondary | ICD-10-CM

## 2018-02-20 DIAGNOSIS — M25551 Pain in right hip: Secondary | ICD-10-CM | POA: Diagnosis not present

## 2018-02-20 DIAGNOSIS — M5030 Other cervical disc degeneration, unspecified cervical region: Secondary | ICD-10-CM

## 2018-02-20 DIAGNOSIS — J449 Chronic obstructive pulmonary disease, unspecified: Secondary | ICD-10-CM

## 2018-02-20 DIAGNOSIS — M810 Age-related osteoporosis without current pathological fracture: Secondary | ICD-10-CM | POA: Diagnosis not present

## 2018-02-20 MED ORDER — PROPYLTHIOURACIL 50 MG PO TABS: 50.0000 mg | ORAL_TABLET | Freq: Two times a day (BID) | ORAL | 1 refills | Status: DC

## 2018-02-20 MED ORDER — HYDROCODONE-ACETAMINOPHEN 10-325 MG PO TABS
0.5000 | ORAL_TABLET | Freq: Three times a day (TID) | ORAL | 0 refills | Status: DC | PRN
Start: 2018-02-20 — End: 2018-06-19

## 2018-02-20 NOTE — Progress Notes (Signed)
HPI: Heather Perkins is a 84 y.o. female who  has a past medical history of Allergy, Cancer ([place]), Colitis, ischemic ([place]), Depression, Fibromyalgia, GERD (gastroesophageal reflux disease), Hyperthyroidism (2010), Peripheral vascular disease ([place]), and Shingles.  she presents to [facility] today, 02/20/18,  for chief complaint of:  Refill pain medications - 3 months follow-up   Controlled substances currently prescribed: Hydrocodone-APAP 10-325 #90, last filled 01/02/2018, she is here for routine 3 months follow-up. Rx prescribed by myself, Emeterio Reeve DO, for diagnosis cervical degenerative disc disease w/ radiculopathy, hip pain. Last refill sent 12/25/17 for 90 tablets take 0.5-1 tablet q8 h prn  [place] controlled substance database reviewed, is consistent with the above history.  Controlled substance contract on file as of 11/2017   No concerns today from patient and daughter other than her endocrinologst retired and she needs refills on meds for hyperthyroid   Also due for routine labs, has been >1 year   She has cut back a lot on smoking!       Past medical history, surgical history, social history and family history reviewed. No updates needed.   Current medication list and allergy/intolerance information reviewed.    Current Outpatient Medications on File Prior to Visit  Medication Sig Dispense Refill  . albuterol (PROVENTIL HFA;VENTOLIN HFA) 108 (90 Base) MCG/ACT inhaler Inhale 2 puffs into the lungs every 6 (six) hours as needed for wheezing or shortness of breath. 1 Inhaler 3  . ALPRAZolam (XANAX) 0.5 MG tablet TAKE 1/2-1 TABLET BY MOUTH TWICE DAILY AS NEEDED (SEVERE ANXIETY) FOR30 DAYS 30 tablet 2  . aspirin EC 81 MG tablet Take 1 tablet (81 mg total) by mouth daily. 90 tablet 3  . B Complex Vitamins (B COMPLEX PO) Take by mouth.    . Calcium-Magnesium-Zinc 500-250-12.5 MG TABS Take by mouth daily. TAKE 1 CAPSULE BY MOUTH 2 TIMES DAILY     . cilostazol (PLETAL) 100 MG tablet Take 1 tablet (100 mg total) by mouth 2 (two) times daily. 180 tablet 3  . cyclobenzaprine (FLEXERIL) 10 MG tablet TAKE ONE TABLET BY MOUTH 3 TIMES DAILY AS NEEDED FOR MUSCLE SPASMS 90 tablet 2  . fish oil-omega-3 fatty acids 1000 MG capsule Take 2 g by mouth daily.    . fluticasone (FLONASE) 50 MCG/ACT nasal spray PLACE 2 SPRAYS INTO BOTH NOSTRILS DAILY 16 g 11  . HYDROcodone-acetaminophen (NORCO) 10-325 MG tablet TAKE 1/2-1 TABLET BY MOUTH EVERY 8 HOURSAS NEEDED. USE SPARINGLY TO PRECENT TOLERANCE/DEPENDENCE 90 tablet 0  . omeprazole (PRILOSEC) 20 MG capsule Take 1 capsule (20 mg total) 2 (two) times daily before a meal by mouth. 180 capsule 3  . polyethylene glycol (MIRALAX / GLYCOLAX) packet Take 17 g by mouth daily.      . pravastatin (PRAVACHOL) 40 MG tablet TAKE ONE TABLET BY MOUTH DAILY 90 tablet 0  . propylthiouracil (PTU) 50 MG tablet Take 50 mg by mouth 2 (two) times daily.     . ranitidine (ZANTAC) 300 MG tablet Take 1 tablet (300 mg total) by mouth at bedtime. 90 tablet 3  . SPIRIVA RESPIMAT 2.5 MCG/ACT AERS 2 INHALATIONS DAILY TO HELP REDUCE COUGH 4 g 11  . varenicline (CHANTIX CONTINUING MONTH PAK) 1 MG tablet Take 1 tablet (1 mg total) by mouth 2 (two) times daily. 30 tablet 1  . varenicline (CHANTIX STARTING MONTH PAK) 0.5 MG X 11 & 1 MG X 42 tablet Take one 0.5 mg tablet by mouth once daily for  3 days, then increase to one 0.5 mg tablet twice daily for 4 days, then increase to one 1 mg tablet twice daily. 42 tablet 0   No current facility-administered medications on file prior to visit.    Allergies  Allergen Reactions  . Augmentin [Amoxicillin-Pot Clavulanate]     Vomiting  . Azithromycin   . Celexa [Citalopram Hydrobromide]   . Codeine   . Crestor [Rosuvastatin Calcium] Nausea Only  . Disalcid [Salsalate]   . Elavil [Amitriptyline Hcl]   . Erythromycin   . Lexapro [Escitalopram Oxalate]   . Livalo [Pitavastatin Calcium] Other  (See Comments)    Headache.   . Methimazole   . Naproxen     nausea  . Neosporin [Neomycin-Bacitracin Zn-Polymyx]   . Neosporin [Neomycin-Polymyxin-Gramicidin]   . Prozac [Fluoxetine]   . [place]win [Pentazocine]   . Tetracyclines & Related   . Wellbutrin [Bupropion] Other (See Comments)    constipation      Review of Systems:  Constitutional: No recent illness  HEENT: No  headache, no vision change  Cardiac: No  chest pain  Respiratory:  No  shortness of breath.  Gastrointestinal: No  abdominal pain, no change on bowel habits  Musculoskeletal: No new myalgia/arthralgia  Skin: No  Rash  Neurologic: No  weakness, No  Dizziness  Psychiatric: No  concerns with depression, No  concerns with anxiety  Exam:  BP (!) 122/52 (BP Location: Left Arm, Patient Position: Sitting, Cuff Size: Normal)   Pulse (!) 115   Temp 97.9 F (36.6 C) (Oral)   Wt 135 lb 1.6 oz (61.3 kg)   BMI 24.71 kg/m   Constitutional: VS see above. General Appearance: alert, well-developed, well-nourished, NAD  Eyes: Normal lids and conjunctive, non-icteric sclera  Neck: No masses, trachea midline.   Respiratory: Normal respiratory effort. no wheeze, no rhonchi, no rales  Cardiovascular: S1/S2 normal, no murmur, no rub/gallop auscultated. RRR.   Musculoskeletal: Gait normal. Symmetric and independent movement of all extremities  Neurological: Normal balance/coordination. No tremor.  Skin: warm, dry, intact.   Psychiatric: Normal judgment/insight. Normal mood and affect. Oriented x3.     ASSESSMENT/PLAN:   DDD (degenerative disc disease), cervical - Plan: HYDROcodone-acetaminophen (NORCO) 10-325 MG tablet  Right hip pain  Pain management  Atherosclerosis of native artery of extremity with intermittent claudication, unspecified extremity (HCC)  Hypercholesteremia - Plan: Lipid panel  Osteoporosis without current pathological fracture, unspecified osteoporosis type - Plan: VITAMIN D 25  Hydroxy (Vit-D Deficiency, Fractures)  Hyperthyroidism - Plan: propylthiouracil (PTU) 50 MG tablet, CBC, TSH, T4, free, Lipid panel  Chronic obstructive pulmonary disease, unspecified COPD type ([place]) - Plan: CBC, COMPLETE METABOLIC PANEL WITH GFR   Meds ordered this encounter  Medications  . HYDROcodone-acetaminophen (NORCO) 10-325 MG tablet    Sig: Take 0.5-1 tablets by mouth every 8 (eight) hours as needed.    Dispense:  90 tablet    Refill:  0  . propylthiouracil (PTU) 50 MG tablet    Sig: Take 1 tablet (50 mg total) by mouth 2 (two) times daily.    Dispense:  90 tablet    Refill:  1      Follow-up plan: Return in about 3 months (around 05/23/2018) for pain medication refills, sooner if needed .  Visit summary with medication list and pertinent instructions was printed for patient to review, alert [place] if any changes needed. All questions at time of visit were answered - patient instructed to contact office with any additional concerns. ER/RTC precautions were  reviewed with the patient and understanding verbalized.   Note: Total time spent 25 minutes, greater than 50% of the visit was spent face-to-face counseling and coordinating care for the following: The primary encounter diagnosis was DDD (degenerative disc disease), cervical. Diagnoses of Right hip pain, Pain management, Atherosclerosis of native artery of extremity with intermittent claudication, unspecified extremity ([place]), Hypercholesteremia, Osteoporosis without current pathological fracture, unspecified osteoporosis type, Hyperthyroidism, and Chronic obstructive pulmonary disease, unspecified COPD type ([place]) were also pertinent to this visit.Marland Kitchen  Please note: voice recognition software was used to produce this document, and typos may escape review. Please contact Dr. Sheppard Coil for any needed clarifications.

## 2018-02-21 LAB — LIPID PANEL
Cholesterol: 184 mg/dL (ref <200)
HDL: 66 mg/dL (ref >50)
LDL Cholesterol (Calc): 91 mg/dL (calc)
Non-HDL Cholesterol (Calc): 118 mg/dL (calc) (ref <130)
Total CHOL/HDL Ratio: 2.8 (calc) (ref <5.0)
Triglycerides: 170 mg/dL — ABNORMAL HIGH (ref <150)

## 2018-02-21 LAB — COMPLETE METABOLIC PANEL WITH GFR
AG Ratio: 1.8 (calc) (ref 1.0–2.5)
ALT: 14 U/L (ref 6–29)
AST: 18 U/L (ref 10–35)
Albumin: 4.6 g/dL (ref 3.6–5.1)
Alkaline phosphatase (APISO): 73 U/L (ref 33–130)
BUN/Creatinine Ratio: 11 (calc) (ref 6–22)
BUN: 12 mg/dL (ref 7–25)
CO2: 26 mmol/L (ref 20–32)
Calcium: 9.7 mg/dL (ref 8.6–10.4)
Chloride: 104 mmol/L (ref 98–110)
Creat: 1.07 mg/dL — ABNORMAL HIGH (ref 0.60–0.88)
GFR, Est African American: 56 mL/min/{1.73_m2} — ABNORMAL LOW (ref >=60)
GFR, Est Non African American: 48 mL/min/{1.73_m2} — ABNORMAL LOW (ref >=60)
Globulin: 2.6 g/dL (calc) (ref 1.9–3.7)
Glucose, Bld: 106 mg/dL — ABNORMAL HIGH (ref 65–99)
Potassium: 4.6 mmol/L (ref 3.5–5.3)
Sodium: 137 mmol/L (ref 135–146)
Total Bilirubin: 0.6 mg/dL (ref 0.2–1.2)
Total Protein: 7.2 g/dL (ref 6.1–8.1)

## 2018-02-21 LAB — CBC
HCT: 43.3 % (ref 35.0–45.0)
Hemoglobin: 14.7 g/dL (ref 11.7–15.5)
MCH: 29.4 pg (ref 27.0–33.0)
MCHC: 33.9 g/dL (ref 32.0–36.0)
MCV: 86.6 fL (ref 80.0–100.0)
MPV: 11.8 fL (ref 7.5–12.5)
Platelets: 297 10*3/uL (ref 140–400)
RBC: 5 10*6/uL (ref 3.80–5.10)
RDW: 13.5 % (ref 11.0–15.0)
WBC: 7.4 10*3/uL (ref 3.8–10.8)

## 2018-02-21 LAB — T4, FREE: Free T4: 1.3 ng/dL (ref 0.8–1.8)

## 2018-02-21 LAB — VITAMIN D 25 HYDROXY (VIT D DEFICIENCY, FRACTURES): Vit D, 25-Hydroxy: 20 ng/mL — ABNORMAL LOW (ref 30–100)

## 2018-02-21 LAB — TSH: TSH: 1.29 mIU/L (ref 0.40–4.50)

## 2018-03-20 ENCOUNTER — Other Ambulatory Visit: Payer: Self-pay | Admitting: Osteopathic Medicine

## 2018-04-23 ENCOUNTER — Encounter: Payer: Self-pay | Admitting: Osteopathic Medicine

## 2018-04-23 ENCOUNTER — Ambulatory Visit (INDEPENDENT_AMBULATORY_CARE_PROVIDER_SITE_OTHER): Payer: Medicare HMO

## 2018-04-23 ENCOUNTER — Ambulatory Visit (INDEPENDENT_AMBULATORY_CARE_PROVIDER_SITE_OTHER): Payer: Medicare HMO | Admitting: Osteopathic Medicine

## 2018-04-23 VITALS — BP 128/82 | HR 91 | Temp 98.0°F | Wt 135.2 lb

## 2018-04-23 DIAGNOSIS — F17200 Nicotine dependence, unspecified, uncomplicated: Secondary | ICD-10-CM | POA: Diagnosis not present

## 2018-04-23 DIAGNOSIS — R053 Chronic cough: Secondary | ICD-10-CM

## 2018-04-23 DIAGNOSIS — J449 Chronic obstructive pulmonary disease, unspecified: Secondary | ICD-10-CM | POA: Diagnosis not present

## 2018-04-23 DIAGNOSIS — I700 Atherosclerosis of aorta: Secondary | ICD-10-CM

## 2018-04-23 DIAGNOSIS — R682 Dry mouth, unspecified: Secondary | ICD-10-CM

## 2018-04-23 DIAGNOSIS — R1310 Dysphagia, unspecified: Secondary | ICD-10-CM | POA: Diagnosis not present

## 2018-04-23 DIAGNOSIS — R05 Cough: Secondary | ICD-10-CM

## 2018-04-23 NOTE — Patient Instructions (Signed)
Plan:  Refer to ENT to look down the throat to evaluate for swallowing issues or damage from smoking   Can try some citrus if it doesn't upset stomach

## 2018-04-23 NOTE — Progress Notes (Signed)
Dg chest HPI: Heather Perkins is a 84 y.o. female who  has a past medical history of Allergy, Cancer ([place]), Colitis, ischemic ([place]), Depression, Fibromyalgia, GERD (gastroesophageal reflux disease), Hyperthyroidism (2010), Peripheral vascular disease ([place]), and Shingles.  she presents to [facility] today, 04/23/18,  for chief complaint of:  Dry mouth  Dry mouth and difficulty swallowing seems to be getting worse over the past few weeks.  She thinks it may be because she is on so many medications.  Has not really noticed any significant pain with swallowing but feels like things are getting stuck, chronic cough as well.  Has cut back a good bit on smoking but has not managed to quit.  Regurgitation.  No swollen lymph nodes in neck.  No fever/chills or night sweats.  No weight changes.  We have made a few attempts to try to get Chantix covered for her.  We also spent some time talking about some of her complex issues with her daughters.  Not relevant to current medical complaints.  Patient is not interested in counseling/medication change.   Past medical history, surgical history, and family history reviewed.  Current medication list and allergy/intolerance information reviewed.   (See remainder of HPI, ROS, Phys Exam below)  Dg Chest 2 View  Result Date: 04/24/2018 CLINICAL DATA:  Chronic cough for the past 2 months. EXAM: CHEST - 2 VIEW COMPARISON:  None. FINDINGS: The heart size and mediastinal contours are within normal limits. Normal pulmonary vascularity. Atherosclerotic calcification of the aortic arch. Mild hyperinflation. Linear scarring in the left lower lobe. No focal consolidation, pleural effusion, or pneumothorax. Eventration of the right hemidiaphragm. No acute osseous abnormality. IMPRESSION: 1. COPD.  No active cardiopulmonary disease. 2.  Aortic atherosclerosis (ICD10-I70.0). Electronically Signed   By: Titus Dubin M.D.   On: 04/24/2018 08:48       ASSESSMENT/PLAN: Advised to continue hard candies to stimulate salivation, would also encourage working on citrus slices such as orange, lemon.  Not on any strongly anticholinergic medicines which would cause dry mouth.  Tobacco dependence certainly a factor.  Will send to ENT for consultation regarding need for laryngoscope?  Dry mouth - Plan: Ambulatory referral to ENT  Dysphagia, unspecified type - Plan: Ambulatory referral to ENT  Tobacco dependence - Plan: Ambulatory referral to ENT  Chronic cough - Plan: DG Chest 2 View     Patient Instructions  Plan:  Refer to ENT to look down the throat to evaluate for swallowing issues or damage from smoking   Can try some citrus if it doesn't upset stomach    Follow-up plan: Return if symptoms worsen or fail to improve.     ############################################ ############################################ ############################################ ############################################    Outpatient Encounter Medications as of 04/23/2018  Medication Sig Note  . albuterol (PROVENTIL HFA;VENTOLIN HFA) 108 (90 Base) MCG/ACT inhaler Inhale 2 puffs into the lungs every 6 (six) hours as needed for wheezing or shortness of breath.   . ALPRAZolam (XANAX) 0.5 MG tablet TAKE 1/2-1 TABLET BY MOUTH TWICE DAILY AS NEEDED (SEVERE ANXIETY) FOR30 DAYS   . aspirin EC 81 MG tablet Take 1 tablet (81 mg total) by mouth daily.   . B Complex Vitamins (B COMPLEX PO) Take by mouth.   . Calcium-Magnesium-Zinc 500-250-12.5 MG TABS Take by mouth daily. TAKE 1 CAPSULE BY MOUTH 2 TIMES DAILY   . cilostazol (PLETAL) 100 MG tablet Take 1 tablet (100 mg total) by mouth 2 (two) times daily.   Marland Kitchen  cyclobenzaprine (FLEXERIL) 10 MG tablet TAKE ONE TABLET BY MOUTH 3 TIMES DAILY AS NEEDED FOR MUSCLE SPASMS   . fish oil-omega-3 fatty acids 1000 MG capsule Take 2 g by mouth daily.   . fluticasone (FLONASE) 50 MCG/ACT nasal spray PLACE 2 SPRAYS INTO  BOTH NOSTRILS DAILY   . HYDROcodone-acetaminophen (NORCO) 10-325 MG tablet Take 0.5-1 tablets by mouth every 8 (eight) hours as needed.   Marland Kitchen omeprazole (PRILOSEC) 20 MG capsule Take 1 capsule (20 mg total) 2 (two) times daily before a meal by mouth.   . polyethylene glycol (MIRALAX / GLYCOLAX) packet Take 17 g by mouth daily.   02/20/2018: As per pt, taking med qod  . pravastatin (PRAVACHOL) 40 MG tablet TAKE ONE TABLET BY MOUTH DAILY   . propylthiouracil (PTU) 50 MG tablet Take 1 tablet (50 mg total) by mouth 2 (two) times daily.   . ranitidine (ZANTAC) 300 MG tablet Take 1 tablet (300 mg total) by mouth at bedtime.   Marland Kitchen SPIRIVA RESPIMAT 2.5 MCG/ACT AERS 2 INHALATIONS DAILY TO HELP REDUCE COUGH   . varenicline (CHANTIX CONTINUING MONTH PAK) 1 MG tablet Take 1 tablet (1 mg total) by mouth 2 (two) times daily. (Patient not taking: Reported on 04/23/2018)   . varenicline (CHANTIX STARTING MONTH PAK) 0.5 MG X 11 & 1 MG X 42 tablet Take one 0.5 mg tablet by mouth once daily for 3 days, then increase to one 0.5 mg tablet twice daily for 4 days, then increase to one 1 mg tablet twice daily. (Patient not taking: Reported on 04/23/2018) 02/20/2018: As per pt, has not taken med yet.   No facility-administered encounter medications on file as of 04/23/2018.    Allergies  Allergen Reactions  . Amitriptyline Other (See Comments)  . Amoxicillin-Pot Clavulanate Nausea And Vomiting    Vomiting   . Bupropion Other (See Comments)    constipation   . Citalopram Hydrobromide Other (See Comments)  . Codeine Nausea And Vomiting and Nausea Only  . Erythromycin Nausea And Vomiting  . Escitalopram Oxalate Nausea And Vomiting  . Neomycin-Bacitracin Zn-Polymyx Other (See Comments)  . Other Nausea And Vomiting, Nausea Only and Rash    All Mycins  . Pentazocine Nausea And Vomiting  . Pitavastatin Other (See Comments)    Headache.   . Salsalate Other (See Comments)  . Tetracyclines & Related Rash  . Azithromycin   .  Crestor [Rosuvastatin Calcium] Nausea Only  . Elavil [Amitriptyline Hcl]   . Livalo [Pitavastatin Calcium] Other (See Comments)    Headache.   . Methimazole   . Naproxen     nausea  . Neosporin [Neomycin-Polymyxin-Gramicidin]   . Prozac [Fluoxetine]   . Rosuvastatin       Review of Systems:  Constitutional: No recent illness  HEENT: No  headache, no vision change, dry mouth as per HPI   cardiac: No  chest pain, No  pressure, No palpitations  Respiratory:  No  shortness of breath. No  Cough  Gastrointestinal: No  abdominal pain, no change on bowel habits  Hem/Onc: No  easy bruising/bleeding, No  abnormal lumps/bumps  Neurologic: No  weakness, No  Dizziness   Exam:  BP 128/82 (BP Location: Left Arm, Patient Position: Sitting, Cuff Size: Normal)   Pulse 91   Temp 98 F (36.7 C) (Oral)   Wt 135 lb 3.2 oz (61.3 kg)   BMI 24.73 kg/m   Constitutional: VS see above. General Appearance: alert, well-developed, well-nourished, NAD  Eyes: Normal lids and conjunctive,  non-icteric sclera  Ears, Nose, Mouth, Throat: MMM, Normal external inspection ears/nares/mouth/lips/gums.  Normal visible pharyngeal tissues.  TMs normal bilaterally.  Neck: No masses, trachea midline.  No tenderness or lymphadenopathy.  Respiratory: Normal respiratory effort. no wheeze, no rhonchi, no rales  Cardiovascular: S1/S2 normal, no murmur, no rub/gallop auscultated. RRR.   Musculoskeletal: Gait normal. Symmetric and independent movement of all extremities  Neurological: Normal balance/coordination. No tremor.  Skin: warm, dry, intact.   Psychiatric: Normal judgment/insight. Normal mood and affect. Oriented x3.   Visit summary with medication list and pertinent instructions was printed for patient to review, advised to alert [place] if any changes needed. All questions at time of visit were answered - patient instructed to contact office with any additional concerns. ER/RTC precautions were reviewed  with the patient and understanding verbalized.   Follow-up plan: Return if symptoms worsen or fail to improve.  Note: Total time spent 25 minutes, greater than 50% of the visit was spent face-to-face counseling and coordinating care for the following: The primary encounter diagnosis was Dry mouth. Diagnoses of Dysphagia, unspecified type, Tobacco dependence, and Chronic cough were also pertinent to this visit.Marland Kitchen  Please note: voice recognition software was used to produce this document, and typos may escape review. Please contact Dr. Sheppard Coil for any needed clarifications.

## 2018-04-24 ENCOUNTER — Encounter: Payer: Self-pay | Admitting: Osteopathic Medicine

## 2018-04-24 DIAGNOSIS — R682 Dry mouth, unspecified: Secondary | ICD-10-CM | POA: Insufficient documentation

## 2018-04-24 DIAGNOSIS — R1310 Dysphagia, unspecified: Secondary | ICD-10-CM | POA: Insufficient documentation

## 2018-04-26 ENCOUNTER — Other Ambulatory Visit: Payer: Self-pay | Admitting: Osteopathic Medicine

## 2018-05-20 ENCOUNTER — Ambulatory Visit (INDEPENDENT_AMBULATORY_CARE_PROVIDER_SITE_OTHER): Payer: Medicare HMO | Admitting: Osteopathic Medicine

## 2018-05-20 VITALS — BP 153/74 | HR 78 | Ht 62.0 in | Wt 131.0 lb

## 2018-05-20 DIAGNOSIS — R0602 Shortness of breath: Secondary | ICD-10-CM | POA: Diagnosis not present

## 2018-05-20 DIAGNOSIS — R053 Chronic cough: Secondary | ICD-10-CM

## 2018-05-20 DIAGNOSIS — R05 Cough: Secondary | ICD-10-CM | POA: Diagnosis not present

## 2018-05-20 NOTE — Patient Instructions (Addendum)
Will try to get Chantix covered Will work on quitting smoking Will continue current medications  Call me when due for pain medicine refill

## 2018-05-20 NOTE — Progress Notes (Signed)
HPI: Heather Perkins is a 84 y.o. female who  has a past medical history of Allergy, Cancer ([place]), Colitis, ischemic ([place]), Depression, Fibromyalgia, GERD (gastroesophageal reflux disease), Hyperthyroidism (2010), Peripheral vascular disease ([place]), and Shingles.  she presents to [facility] today, 05/20/18,  for chief complaint of:  PFT, chronic cough  Patient here today for spirometry/lung function test for chronic cough, suspicion COPD.  Test had to be stopped, patient started taking deep breaths in and out and immediately felt short winded, chest tightness, panicky.   Reports anxiety, 1 of her daughters, that she does not particularly get along with, brought her to her visit today.  This daughter is not currently in the room with the patient.  She thinks this has something to do with it -thinks if the other daughter was here she would feel a little bit better.  After a few minutes, she was able to calm down.  EKG showed no concerns.     Past medical history, surgical history, and family history reviewed.  Current medication list and allergy/intolerance information reviewed.   (See remainder of HPI, ROS, Phys Exam below)    ASSESSMENT/PLAN:   SOB (shortness of breath) - Plan: EKG 12-Lead  Chronic cough      Patient Instructions  Will try to get Chantix covered Will work on quitting smoking Will continue current medications  Call me when due for pain medicine refill    Follow-up plan: Return in about 3 months (around 08/20/2018) for recheck thyroid levels and breathing, sooner if needed! .          ############################################ ############################################ ############################################ ############################################    Outpatient Encounter Medications as of 05/20/2018  Medication Sig Note  . albuterol (PROVENTIL HFA;VENTOLIN HFA) 108 (90 Base) MCG/ACT inhaler Inhale 2  puffs into the lungs every 6 (six) hours as needed for wheezing or shortness of breath.   . ALPRAZolam (XANAX) 0.5 MG tablet TAKE 1/2-1 TABLET BY MOUTH TWICE DAILY AS NEEDED (SEVERE ANXIETY) FOR30 DAYS   . aspirin EC 81 MG tablet Take 1 tablet (81 mg total) by mouth daily.   . B Complex Vitamins (B COMPLEX PO) Take by mouth.   . Calcium-Magnesium-Zinc 500-250-12.5 MG TABS Take by mouth daily. TAKE 1 CAPSULE BY MOUTH 2 TIMES DAILY   . cilostazol (PLETAL) 100 MG tablet Take 1 tablet (100 mg total) by mouth 2 (two) times daily.   . cyclobenzaprine (FLEXERIL) 10 MG tablet TAKE ONE TABLET BY MOUTH 3 TIMES DAILY AS NEEDED FOR MUSCLE SPASMS   . fish oil-omega-3 fatty acids 1000 MG capsule Take 2 g by mouth daily.   . fluticasone (FLONASE) 50 MCG/ACT nasal spray PLACE 2 SPRAYS INTO BOTH NOSTRILS DAILY   . HYDROcodone-acetaminophen (NORCO) 10-325 MG tablet Take 0.5-1 tablets by mouth every 8 (eight) hours as needed.   Marland Kitchen omeprazole (PRILOSEC) 20 MG capsule Take 1 capsule (20 mg total) 2 (two) times daily before a meal by mouth.   . polyethylene glycol (MIRALAX / GLYCOLAX) packet Take 17 g by mouth daily.   02/20/2018: As per pt, taking med qod  . pravastatin (PRAVACHOL) 40 MG tablet TAKE ONE TABLET BY MOUTH DAILY   . propylthiouracil (PTU) 50 MG tablet Take 1 tablet (50 mg total) by mouth 2 (two) times daily.   . ranitidine (ZANTAC) 300 MG tablet Take 1 tablet (300 mg total) by mouth at bedtime.   Marland Kitchen SPIRIVA RESPIMAT 2.5 MCG/ACT AERS 2 INHALATIONS DAILY TO HELP REDUCE COUGH   .  varenicline (CHANTIX CONTINUING MONTH PAK) 1 MG tablet Take 1 tablet (1 mg total) by mouth 2 (two) times daily.   . varenicline (CHANTIX STARTING MONTH PAK) 0.5 MG X 11 & 1 MG X 42 tablet Take one 0.5 mg tablet by mouth once daily for 3 days, then increase to one 0.5 mg tablet twice daily for 4 days, then increase to one 1 mg tablet twice daily. 02/20/2018: As per pt, has not taken med yet.   No facility-administered encounter  medications on file as of 05/20/2018.    Allergies  Allergen Reactions  . Amitriptyline Other (See Comments)  . Amoxicillin-Pot Clavulanate Nausea And Vomiting    Vomiting   . Bupropion Other (See Comments)    constipation   . Citalopram Hydrobromide Other (See Comments)  . Codeine Nausea And Vomiting and Nausea Only  . Erythromycin Nausea And Vomiting  . Escitalopram Oxalate Nausea And Vomiting  . Neomycin-Bacitracin Zn-Polymyx Other (See Comments)  . Other Nausea And Vomiting, Nausea Only and Rash    All Mycins  . Pentazocine Nausea And Vomiting  . Pitavastatin Other (See Comments)    Headache.   . Salsalate Other (See Comments)  . Tetracyclines & Related Rash  . Azithromycin   . Crestor [Rosuvastatin Calcium] Nausea Only  . Elavil [Amitriptyline Hcl]   . Livalo [Pitavastatin Calcium] Other (See Comments)    Headache.   . Methimazole   . Naproxen     nausea  . Neosporin [Neomycin-Polymyxin-Gramicidin]   . Prozac [Fluoxetine]   . Rosuvastatin       Review of Systems:  Constitutional: No recent illness  HEENT: No  headache, no vision change  Cardiac: No  chest pain, No  pressure, No palpitations  Respiratory:  +shortness of breath. +Cough  Gastrointestinal: No  abdominal pain, no change on bowel habits  Psychiatric: No  concerns with depression, +concerns with anxiety  Exam:  BP (!) 153/74   Pulse 78   Ht 5\' 2"  (1.575 m)   Wt 131 lb (59.4 kg)   SpO2 100%   BMI 23.96 kg/m   Constitutional: VS see above. General Appearance: alert, well-developed, well-nourished, NAD  Eyes: Normal lids and conjunctive, non-icteric sclera  Ears, Nose, Mouth, Throat: MMM, Normal external inspection ears/nares/mouth/lips/gums.  Neck: No masses, trachea midline.   Respiratory: Normal respiratory effort. no wheeze, no rhonchi, no rales.  On auscultation during the "episode" patient was audibly making some wheezing sounds more in the throat, lungs themselves sounded find  there was a bit difficult to ascertain.  In between, she was speaking in full sentences, no increased respiratory rate.  Cardiovascular: S1/S2 normal, no murmur, no rub/gallop auscultated. RRR.   Musculoskeletal: Gait normal. Symmetric and independent movement of all extremities  Neurological: Normal balance/coordination. No tremor.  Skin: warm, dry, intact.   Psychiatric: Normal judgment/insight. Normal mood and affect. Oriented x3.   Visit summary with medication list and pertinent instructions was printed for patient to review, advised to alert [place] if any changes needed. All questions at time of visit were answered - patient instructed to contact office with any additional concerns. ER/RTC precautions were reviewed with the patient and understanding verbalized.    Follow-up plan: Return in about 3 months (around 08/20/2018) for recheck thyroid levels and breathing, sooner if needed! .  Note: Total time spent 25 minutes, greater than 50% of the visit was spent face-to-face counseling and coordinating care for the following: The primary encounter diagnosis was SOB (shortness of breath). A diagnosis of  Chronic cough was also pertinent to this visit.Marland Kitchen  Please note: voice recognition software was used to produce this document, and typos may escape review. Please contact Dr. Sheppard Coil for any needed clarifications.

## 2018-05-21 ENCOUNTER — Encounter: Payer: Self-pay | Admitting: Osteopathic Medicine

## 2018-05-22 ENCOUNTER — Other Ambulatory Visit: Payer: Self-pay | Admitting: Osteopathic Medicine

## 2018-05-22 DIAGNOSIS — M5030 Other cervical disc degeneration, unspecified cervical region: Secondary | ICD-10-CM

## 2018-05-22 NOTE — Telephone Encounter (Signed)
[place] requesting med RF for cyclobenzaprine med.

## 2018-05-23 ENCOUNTER — Ambulatory Visit: Payer: Medicare HMO | Admitting: Osteopathic Medicine

## 2018-05-23 NOTE — Telephone Encounter (Signed)
Pt's daughter has been updated of medication RF.

## 2018-05-31 ENCOUNTER — Other Ambulatory Visit: Payer: Self-pay | Admitting: Osteopathic Medicine

## 2018-05-31 NOTE — Telephone Encounter (Signed)
Heather Perkins requesting med RF for alprazolam rx.

## 2018-05-31 NOTE — Telephone Encounter (Signed)
Pt's daughter has been updated of med RF. No other inquiries during call.

## 2018-06-19 ENCOUNTER — Other Ambulatory Visit: Payer: Self-pay

## 2018-06-19 DIAGNOSIS — M5030 Other cervical disc degeneration, unspecified cervical region: Secondary | ICD-10-CM

## 2018-06-19 MED ORDER — HYDROCODONE-ACETAMINOPHEN 10-325 MG PO TABS: 0.5000 | ORAL_TABLET | Freq: Three times a day (TID) | ORAL | 0 refills | Status: DC | PRN

## 2018-06-19 NOTE — Telephone Encounter (Signed)
Pt's daughter Denissa Cozart called requesting RF on pt's Norco.   Last RX sent on 02-20-18.  Wanting RX to go to [facility] in [place].   RX pended, please review and send if appropriate  Thanks!

## 2018-06-25 ENCOUNTER — Other Ambulatory Visit: Payer: Self-pay | Admitting: Osteopathic Medicine

## 2018-06-25 DIAGNOSIS — E0590 Thyrotoxicosis, unspecified without thyrotoxic crisis or storm: Secondary | ICD-10-CM

## 2018-06-26 NOTE — Progress Notes (Signed)
Subjective:   Heather Perkins is a 84 y.o. female who presents for Medicare Annual (Subsequent) preventive examination.  Review of Systems:  No ROS.  Medicare Wellness Visit. Additional risk factors are reflected in the social history.  Cardiac Risk Factors include: advanced age (>31men, >5 women);dyslipidemia;sedentary lifestyle Sleep patterns: Sleeps 3-4 hours a night. Having difficulty falling asleep. Home Safety/Smoke Alarms: Feels safe in home. Smoke alarms in place.  Living environment;lives with daughter Heather Perkins in 1 story home.Steps with hand rails and ramp out front.  Shower is walk in but no grab bars.    Female:   Pap- aged out      [place]-  Aged out     Dexa scan- per pt had done       CCS-aged out     Objective:     Vitals: There were no vitals taken for this visit.  There is no height or weight on file to calculate BMI.  Advanced Directives 01/23/2014  Does Patient Have a Medical Advance Directive? Patient has advance directive, copy not in chart  Type of Advance Directive [place] in Chart? Copy requested from family    Tobacco Social History   Tobacco Use  Smoking Status Current Every Day Smoker  . Packs/day: 1.00  . Years: 50.00  . Pack years: 50.00  . Types: Cigarettes  Smokeless Tobacco Never Used  Tobacco Comment   pt states that she has been trying to quit but can't seem to quit     Ready to quit: Not Answered Counseling given: Not Answered Comment: pt states that she has been trying to quit but can't seem to quit   Clinical Intake:                       Past Medical History:  Diagnosis Date  . Allergy   . Cancer ([place])    skin  . Colitis, ischemic ([place])   . Depression   . Fibromyalgia   . GERD (gastroesophageal reflux disease)   . Hyperthyroidism 2010   sees Dr Marcello Moores, did RAI  . Peripheral vascular disease ([place])   . Shingles    Past Surgical History:   Procedure Laterality Date  . ABDOMINAL HYSTERECTOMY  1975   for dub  . APPENDECTOMY  1939  . BREAST BIOPSY  1954  . [place]   right  . CATARACT EXTRACTION  2009   right and left.   . CERVICAL FUSION    . [place]  . ORIF ANKLE FRACTURE  1979   left  . TONSILLECTOMY     Family History  Problem Relation Age of Onset  . Hypertension Sister   . Cancer Daughter   . Hyperlipidemia Daughter    Social History   Socioeconomic History  . Marital status: Single    Spouse name: Not on file  . Number of children: 3  . Years of education: college  . Highest education level: 12th grade  Occupational History  . Occupation: retired    Comment: LPN  Social Needs  . Financial resource strain: Not hard at all  . Food insecurity:    Worry: Never true    Inability: Never true  . Transportation needs:    Medical: No    Non-medical: No  Tobacco Use  . Smoking status: Current Every Day Smoker    Packs/day: 1.00    Years: 50.00  Pack years: 50.00    Types: Cigarettes  . Smokeless tobacco: Never Used  . Tobacco comment: pt states that she has been trying to quit but can't seem to quit  Substance and Sexual Activity  . Alcohol use: No    Frequency: Never  . Drug use: No  . Sexual activity: Not Currently  Lifestyle  . Physical activity:    Days per week: 0 days    Minutes per session: 0 min  . Stress: Not at all  Relationships  . Social connections:    Talks on phone: Once a week    Gets together: Never    Attends religious service: Never    Active member of club or organization: No    Attends meetings of clubs or organizations: Never    Relationship status: Not on file  Other Topics Concern  . Not on file  Social History Narrative  . Not on file    Outpatient Encounter Medications as of 07/09/2018  Medication Sig  . albuterol (PROVENTIL HFA;VENTOLIN HFA) 108 (90 Base) MCG/ACT inhaler Inhale 2 puffs into the lungs every 6 (six) hours  as needed for wheezing or shortness of breath.  . ALPRAZolam (XANAX) 0.5 MG tablet TAKE 1/2-1 TABLET BY MOUTH TWICE DAILY AS NEEDED FOR SEVERE ANXIETY FOR 30 DAYS  . aspirin EC 81 MG tablet Take 1 tablet (81 mg total) by mouth daily.  . B Complex Vitamins (B COMPLEX PO) Take by mouth.  . Calcium-Magnesium-Zinc 500-250-12.5 MG TABS Take by mouth daily. TAKE 1 CAPSULE BY MOUTH 2 TIMES DAILY  . cilostazol (PLETAL) 100 MG tablet Take 1 tablet (100 mg total) by mouth 2 (two) times daily.  . cyclobenzaprine (FLEXERIL) 10 MG tablet TAKE ONE TABLET BY MOUTH 3 TIMES DAILY AS NEEDED FOR MUSCLE SPASMS  . fish oil-omega-3 fatty acids 1000 MG capsule Take 2 g by mouth daily.  . fluticasone (FLONASE) 50 MCG/ACT nasal spray PLACE 2 SPRAYS INTO BOTH NOSTRILS DAILY  . HYDROcodone-acetaminophen (NORCO) 10-325 MG tablet Take 0.5-1 tablets by mouth every 8 (eight) hours as needed.  Marland Kitchen omeprazole (PRILOSEC) 20 MG capsule Take 1 capsule (20 mg total) 2 (two) times daily before a meal by mouth.  . pravastatin (PRAVACHOL) 40 MG tablet TAKE ONE TABLET BY MOUTH DAILY  . propylthiouracil (PTU) 50 MG tablet Take 1 tablet (50 mg total) by mouth 2 (two) times daily.  . ranitidine (ZANTAC) 300 MG tablet Take 1 tablet (300 mg total) by mouth at bedtime.  Marland Kitchen SPIRIVA RESPIMAT 2.5 MCG/ACT AERS 2 INHALATIONS DAILY TO HELP REDUCE COUGH  . polyethylene glycol (MIRALAX / GLYCOLAX) packet Take 17 g by mouth daily.    . varenicline (CHANTIX CONTINUING MONTH PAK) 1 MG tablet Take 1 tablet (1 mg total) by mouth 2 (two) times daily. (Patient not taking: Reported on 07/09/2018)  . varenicline (CHANTIX STARTING MONTH PAK) 0.5 MG X 11 & 1 MG X 42 tablet Take one 0.5 mg tablet by mouth once daily for 3 days, then increase to one 0.5 mg tablet twice daily for 4 days, then increase to one 1 mg tablet twice daily. (Patient not taking: Reported on 07/09/2018)  . [DISCONTINUED] propylthiouracil (PTU) 50 MG tablet Take 1 tablet (50 mg total) by mouth 2  (two) times daily.   No facility-administered encounter medications on file as of 07/09/2018.     Activities of Daily Living In your present state of health, do you have any difficulty performing the following activities: 07/09/2018  Hearing? Heather Perkins  Comment partial hearing loss. Hearing aids in place  Vision? N  Difficulty concentrating or making decisions? N  Walking or climbing stairs? N  Dressing or bathing? N  Doing errands, shopping? N  Preparing Food and eating ? N  Using the Toilet? N  In the past six months, have you accidently leaked urine? N  Do you have problems with loss of bowel control? N  Managing your Medications? N  Managing your Finances? N  Housekeeping or managing your Housekeeping? N  Some recent data might be hidden    Patient Care Team: Heather Reeve, DO as PCP - General (Osteopathic Medicine)    Assessment:   This is a routine wellness examination for Heather Perkins. Physical assessment deferred to PCP.   Exercise Activities and Dietary recommendations Current Exercise Habits: The patient does not participate in regular exercise at present, Exercise limited by: orthopedic condition(s);respiratory conditions(s) Diet healthy diet. Eats out some and cooks at home Breakfast:cereal Lunch: sandwich Dinner: Stage manager    . Exercise 3x per week (30 min per time)     Try and start back walking again.       Fall Risk Fall Risk  07/09/2018 05/15/2017  Falls in the past year? Yes No  Comment fell backwards, no injury -  Number falls in past yr: 1 -  Injury with Fall? No -  Follow up Falls prevention discussed -   Is the patient's home free of loose throw rugs in walkways, pet beds, electrical cords, etc?   yes      Grab bars in the bathroom? no      Handrails on the stairs?   yes      Adequate lighting?   yes  Depression Screen PHQ 2/9 Scores 07/09/2018 11/15/2017 05/15/2017  PHQ - 2 Score 0 1 2  PHQ- 9 Score - 6 -     Cognitive  Function     6CIT Screen 07/09/2018  What Year? 0 points  What month? 0 points  What time? 0 points  Count back from 20 0 points  Months in reverse 0 points  Repeat phrase 0 points  Total Score 0    Immunization History  Administered Date(s) Administered  . Influenza, High Dose Seasonal PF 08/15/2017  . Influenza,inj,Quad PF,6+ Mos 11/15/2016  . Pneumococcal Conjugate-13 11/15/2016  . Pneumococcal Polysaccharide-23 10/09/2008  . Tdap 03/27/2012    Screening Tests Health Maintenance  Topic Date Due  . DEXA SCAN  11/17/2000  . INFLUENZA VACCINE  02/05/2019 (Originally 05/09/2018)  . TETANUS/TDAP  03/27/2022  . PNA vac Low Risk Adult  Completed      Plan:    Please schedule your next medicare wellness visit with me in 1 yr.  Ms. Steinmiller , Thank you for taking time to come for your Medicare Wellness Visit. I appreciate your ongoing commitment to your health goals. Please review the following plan we discussed and let me know if I can assist you in the future.   These are the goals we discussed: Goals    . Exercise 3x per week (30 min per time)     Try and start back walking again.       This is a list of the screening recommended for you and due dates:  Health Maintenance  Topic Date Due  . DEXA scan (bone density measurement)  11/17/2000  . Flu Shot  02/05/2019*  . Tetanus Vaccine  03/27/2022  . Pneumonia  vaccines  Completed  *Topic was postponed. The date shown is not the original due date.      I have personally reviewed and noted the following in the patient's chart:   . Medical and social history . Use of alcohol, tobacco or illicit drugs  . Current medications and supplements . Functional ability and status . Nutritional status . Physical activity . Advanced directives . List of other physicians . Hospitalizations, surgeries, and ER visits in previous 12 months . Vitals . Screenings to include cognitive, depression, and falls . Referrals and  appointments  In addition, I have reviewed and discussed with patient certain preventive protocols, quality metrics, and best practice recommendations. A written personalized care plan for preventive services as well as general preventive health recommendations were provided to patient.     Joanne Chars, LPN  [id number]

## 2018-06-27 ENCOUNTER — Other Ambulatory Visit: Payer: Self-pay

## 2018-06-27 DIAGNOSIS — E0590 Thyrotoxicosis, unspecified without thyrotoxic crisis or storm: Secondary | ICD-10-CM

## 2018-06-27 MED ORDER — PROPYLTHIOURACIL 50 MG PO TABS: 50.0000 mg | ORAL_TABLET | Freq: Two times a day (BID) | ORAL | 2 refills | Status: DC

## 2018-06-27 NOTE — Telephone Encounter (Signed)
Pt takes PTU twice daily and only a quantity of 30 was called in, will call in a full 30 day supply for pt

## 2018-07-09 ENCOUNTER — Ambulatory Visit (INDEPENDENT_AMBULATORY_CARE_PROVIDER_SITE_OTHER): Payer: Medicare HMO | Admitting: *Deleted

## 2018-07-09 DIAGNOSIS — Z0000 Encounter for general adult medical examination without abnormal findings: Secondary | ICD-10-CM | POA: Insufficient documentation

## 2018-07-09 NOTE — Patient Instructions (Addendum)
Please schedule your next medicare wellness visit with me in 1 yr.  Ms. Heather Perkins , Thank you for taking time to come for your Medicare Wellness Visit. I appreciate your ongoing commitment to your health goals. Please review the following plan we discussed and let me know if I can assist you in the future.  These are the goals we discussed: Goals    . Exercise 3x per week (30 min per time)     Try and start back walking again.

## 2018-07-10 ENCOUNTER — Other Ambulatory Visit: Payer: Self-pay

## 2018-07-10 ENCOUNTER — Telehealth: Payer: Self-pay

## 2018-07-10 NOTE — Telephone Encounter (Signed)
Patient was in for her wellness visit with me yesterday and her BP was 112/47 and pulse was 63. She had readings from home which were normal. She stated that she had not been drinking much yesterday so I thought it was due to hydration so instructed her when left here to really hydrate and call back today with BP reading last night. Her BP's are normally not this low so wanted someone just to take a look at it. She is not on any BP medications. See Heather Perkins note for her BP reading laast night. KG LPN

## 2018-07-10 NOTE — Telephone Encounter (Signed)
Requesting RF on Xanax.   Last refilled 05-31-18 for #30   RX pended, please send if appropriate

## 2018-07-10 NOTE — Telephone Encounter (Signed)
Pt called to let Maudie Mercury know that last night her BP reading was 97/61 pulse 88

## 2018-07-10 NOTE — Telephone Encounter (Signed)
[name] advised. She will contact office if pt develops any SX of low BP

## 2018-07-10 NOTE — Telephone Encounter (Signed)
As long as she is not symptomatic (no lightheadedness, fainting, shortness of breath with exertion), this can just be monitored

## 2018-07-11 NOTE — Telephone Encounter (Signed)
Looks like she is also on chronic narcotics Im not comfortable refilling this PCP will be in office tomorrow and can refill if appropriate

## 2018-07-12 MED ORDER — ALPRAZOLAM 0.5 MG PO TABS: ORAL_TABLET | ORAL | 0 refills | Status: DC

## 2018-07-31 ENCOUNTER — Other Ambulatory Visit: Payer: Self-pay

## 2018-07-31 MED ORDER — PRAVASTATIN SODIUM 40 MG PO TABS: 40.0000 mg | ORAL_TABLET | Freq: Every day | ORAL | 0 refills | Status: DC

## 2018-08-20 ENCOUNTER — Ambulatory Visit: Payer: Medicare HMO | Admitting: Osteopathic Medicine

## 2018-08-27 ENCOUNTER — Other Ambulatory Visit: Payer: Self-pay | Admitting: Osteopathic Medicine

## 2018-08-27 NOTE — Telephone Encounter (Signed)
[facility] refill for alprazolam.

## 2018-08-27 NOTE — Telephone Encounter (Signed)
Pt's daughter has been updated. No other inquiries during call.

## 2018-08-28 ENCOUNTER — Ambulatory Visit (INDEPENDENT_AMBULATORY_CARE_PROVIDER_SITE_OTHER): Payer: Medicare HMO | Admitting: Osteopathic Medicine

## 2018-08-28 ENCOUNTER — Encounter: Payer: Self-pay | Admitting: Osteopathic Medicine

## 2018-08-28 VITALS — BP 139/72 | HR 88 | Temp 98.2°F | Wt 129.6 lb

## 2018-08-28 DIAGNOSIS — J441 Chronic obstructive pulmonary disease with (acute) exacerbation: Secondary | ICD-10-CM | POA: Diagnosis not present

## 2018-08-28 DIAGNOSIS — J440 Chronic obstructive pulmonary disease with acute lower respiratory infection: Secondary | ICD-10-CM

## 2018-08-28 DIAGNOSIS — J209 Acute bronchitis, unspecified: Secondary | ICD-10-CM | POA: Diagnosis not present

## 2018-08-28 MED ORDER — PREDNISONE 20 MG PO TABS: 20.0000 mg | ORAL_TABLET | Freq: Two times a day (BID) | ORAL | 0 refills | Status: DC

## 2018-08-28 MED ORDER — BENZONATATE 200 MG PO CAPS: 200.0000 mg | ORAL_CAPSULE | Freq: Three times a day (TID) | ORAL | 0 refills | Status: DC | PRN

## 2018-08-28 NOTE — Progress Notes (Signed)
HPI: Heather Perkins is a 84 y.o. female who  has a past medical history of Allergy, Cancer ([place]), Colitis, ischemic ([place]), Depression, Fibromyalgia, GERD (gastroesophageal reflux disease), Hyperthyroidism (2010), Peripheral vascular disease ([place]), and Shingles.  she presents to [facility] today, 08/28/18,  for chief complaint of: Concern for bronchitis  Ongoing cough for about a week, initially associated with sinus congestion and runny nose, sore throat but no cough seems to be lingering.  History of COPD, patient is still smoking, we are trying to get Chantix approved through her insurance.  Patient is benefiting from [place] since initiation of this a few months ago, is not really using her rescue inhaler.  Daughter sick with similar illness last week    Social History   Tobacco Use  Smoking Status Current Every Day Smoker  . Packs/day: 1.00  . Years: 50.00  . Pack years: 50.00  . Types: Cigarettes  Smokeless Tobacco Never Used  Tobacco Comment   pt states that she has been trying to quit but can't seem to quit   Immunization History  Administered Date(s) Administered  . Influenza, High Dose Seasonal PF 08/15/2017  . Influenza,inj,Quad PF,6+ Mos 11/15/2016  . Pneumococcal Conjugate-13 11/15/2016  . Pneumococcal Polysaccharide-23 10/09/2008  . Tdap 03/27/2012       Past medical, surgical, social and family history reviewed and updated as necessary.   Current medication list and allergy/intolerance information reviewed:    Current Outpatient Medications  Medication Sig Dispense Refill  . albuterol (PROVENTIL HFA;VENTOLIN HFA) 108 (90 Base) MCG/ACT inhaler Inhale 2 puffs into the lungs every 6 (six) hours as needed for wheezing or shortness of breath. 1 Inhaler 3  . ALPRAZolam (XANAX) 0.5 MG tablet TAKE 1/2 TO 1 TABLET BY MOUTH TWICE DAILY AS NEEDED FOR SEVERE ANXIETY 30 tablet 0  . aspirin EC 81 MG tablet Take 1 tablet (81 mg total) by  mouth daily. 90 tablet 3  . B Complex Vitamins (B COMPLEX PO) Take by mouth.    . Calcium-Magnesium-Zinc 500-250-12.5 MG TABS Take by mouth daily. TAKE 1 CAPSULE BY MOUTH 2 TIMES DAILY    . cilostazol (PLETAL) 100 MG tablet Take 1 tablet (100 mg total) by mouth 2 (two) times daily. 180 tablet 3  . cyclobenzaprine (FLEXERIL) 10 MG tablet TAKE ONE TABLET BY MOUTH 3 TIMES DAILY AS NEEDED FOR MUSCLE SPASMS 90 tablet 2  . fish oil-omega-3 fatty acids 1000 MG capsule Take 2 g by mouth daily.    . fluticasone (FLONASE) 50 MCG/ACT nasal spray PLACE 2 SPRAYS INTO BOTH NOSTRILS DAILY 16 g 11  . HYDROcodone-acetaminophen (NORCO) 10-325 MG tablet Take 0.5-1 tablets by mouth every 8 (eight) hours as needed. 90 tablet 0  . omeprazole (PRILOSEC) 20 MG capsule Take 1 capsule (20 mg total) 2 (two) times daily before a meal by mouth. 180 capsule 3  . polyethylene glycol (MIRALAX / GLYCOLAX) packet Take 17 g by mouth daily.      . pravastatin (PRAVACHOL) 40 MG tablet Take 1 tablet (40 mg total) by mouth daily. 90 tablet 0  . propylthiouracil (PTU) 50 MG tablet Take 1 tablet (50 mg total) by mouth 2 (two) times daily. 60 tablet 2  . ranitidine (ZANTAC) 300 MG tablet Take 1 tablet (300 mg total) by mouth at bedtime. 90 tablet 3  . SPIRIVA RESPIMAT 2.5 MCG/ACT AERS 2 INHALATIONS DAILY TO HELP REDUCE COUGH 4 g 11  . varenicline (CHANTIX CONTINUING MONTH PAK) 1 MG tablet  Take 1 tablet (1 mg total) by mouth 2 (two) times daily. 30 tablet 1  . varenicline (CHANTIX STARTING MONTH PAK) 0.5 MG X 11 & 1 MG X 42 tablet Take one 0.5 mg tablet by mouth once daily for 3 days, then increase to one 0.5 mg tablet twice daily for 4 days, then increase to one 1 mg tablet twice daily. 42 tablet 0   No current facility-administered medications for this visit.     Allergies  Allergen Reactions  . Amitriptyline Other (See Comments)  . Amoxicillin-Pot Clavulanate Nausea And Vomiting    Vomiting   . Bupropion Other (See Comments)     constipation   . Citalopram Hydrobromide Other (See Comments)  . Codeine Nausea And Vomiting and Nausea Only  . Erythromycin Nausea And Vomiting  . Escitalopram Oxalate Nausea And Vomiting  . Neomycin-Bacitracin Zn-Polymyx Other (See Comments)  . Other Nausea And Vomiting, Nausea Only and Rash    All Mycins  . Pentazocine Nausea And Vomiting  . Pitavastatin Other (See Comments)    Headache.   . Salsalate Other (See Comments)  . Tetracyclines & Related Rash  . Azithromycin   . Crestor [Rosuvastatin Calcium] Nausea Only  . Elavil [Amitriptyline Hcl]   . Livalo [Pitavastatin Calcium] Other (See Comments)    Headache.   . Methimazole   . Naproxen     nausea  . Neosporin [Neomycin-Polymyxin-Gramicidin]   . Prozac [Fluoxetine]   . Rosuvastatin       Review of Systems:  Constitutional:  No  fever, no chills, +recent illness, No unintentional weight changes. No significant fatigue.   HEENT: No  headache, no vision change, no hearing change, No sore throat, No  sinus pressure  Cardiac: No  chest pain, No  pressure, No palpitations  Respiratory:  No  shortness of breath. +Cough  Gastrointestinal: No  abdominal pain, No  nausea, No  vomiting,  No  blood in stool, No  diarrhea  Musculoskeletal: No new myalgia/arthralgia  Skin: No  Rash  Neurologic: No  weakness, No  dizziness  Exam:  BP 139/72 (BP Location: Left Arm, Patient Position: Sitting, Cuff Size: Normal)   Pulse 88   Temp 98.2 F (36.8 C) (Oral)   Wt 129 lb 9.6 oz (58.8 kg)   SpO2 96%   BMI 23.70 kg/m   Constitutional: VS see above. General Appearance: alert, well-developed, well-nourished, NAD  Eyes: Normal lids and conjunctive, non-icteric sclera  Ears, Nose, Mouth, Throat: MMM, Normal external inspection ears/nares/mouth/lips/gums. TM normal bilaterally. Pharynx/tonsils no erythema, no exudate. Nasal mucosa normal.   Neck: No masses, trachea midline. No tenderness/mass appreciated. No  lymphadenopathy  Respiratory: Normal respiratory effort. no wheeze, no rhonchi, no rales.  Diminished breath sounds bilaterally in all lung fields  Cardiovascular: S1/S2 normal, no murmur, no rub/gallop auscultated. RRR. No lower extremity edema.   Gastrointestinal: Nontender, no masses. Bowel sounds normal.  Musculoskeletal: Gait normal.   Neurological: Normal balance/coordination. No tremor.   Skin: warm, dry, intact.   Psychiatric: Normal judgment/insight. Normal mood and affect.     ASSESSMENT/PLAN: The primary encounter diagnosis was COPD exacerbation ([place]). A diagnosis of Acute bronchitis with COPD ([place]) was also pertinent to this visit.   Meds ordered this encounter  Medications  . predniSONE (DELTASONE) 20 MG tablet    Sig: Take 1 tablet (20 mg total) by mouth 2 (two) times daily with a meal.    Dispense:  10 tablet    Refill:  0  . benzonatate (TESSALON)  200 MG capsule    Sig: Take 1 capsule (200 mg total) by mouth 3 (three) times daily as needed for cough.    Dispense:  30 capsule    Refill:  0     Patient Instructions  Over-the-Counter Medications & Home Remedies for Respiratory Illness  Note: the following list assumes no pregnancy, normal liver & kidney function and no other drug interactions. Always ask a pharmacist or qualified medical provider if you have any questions!   Aches/Pains, Fever, Headache Acetaminophen (Tylenol) 500 mg tablets - take max 2 tablets (1000 mg) every 6 hours (4 times per day)  Ibuprofen (Motrin) 200 mg tablets - take max 4 tablets (800 mg) every 6 hours  Sinus Congestion Prescription Atrovent as directed Nasal Saline if desired Phenylephrine (Sudafed) 10 mg tablets every 4 hours (or the 12-hour formulation) Diphenhydramine (Benadryl) 25 mg tablets - take max 2 tablets every 4 hours  Cough & Sore Throat Prescription cough pills or syrups as directed Dextromethorphan (Robitussin, others) - cough suppressant Guaifenesin  (Robitussin, Mucinex, others) - expectorant (helps cough up mucus) (Dextromethorphan and Guaifenesin also come in a combination tablet) Lozenges w/ Benzocaine + Menthol (Cepacol) Honey - as much as you want! Teas which "coat the throat" - look for ingredients Elm Bark, Licorice Root, Marshmallow Root  Other Zinc Lozenges within 24 hours of symptoms onset - mixed evidence this shortens the duration of the common cold Don't waste your money on Vitamin C or Echinacea           Visit summary with medication list and pertinent instructions was printed for patient to review. All questions at time of visit were answered - patient instructed to contact office with any additional concerns or updates. ER/RTC precautions were reviewed with the patient.   Follow-up plan: Return if symptoms worsen or fail to improve.   Please note: voice recognition software was used to produce this document, and typos may escape review. Please contact Dr. Sheppard Coil for any needed clarifications.

## 2018-08-28 NOTE — Patient Instructions (Addendum)
Over-the-Counter Medications & Home Remedies for Respiratory Illness  Note: the following list assumes no pregnancy, normal liver & kidney function and no other drug interactions. Always ask a pharmacist or qualified medical provider if you have any questions!   Aches/Pains, Fever, Headache Acetaminophen (Tylenol) 500 mg tablets - take max 2 tablets (1000 mg) every 6 hours (4 times per day)  Ibuprofen (Motrin) 200 mg tablets - take max 4 tablets (800 mg) every 6 hours  Sinus Congestion Prescription Atrovent as directed Nasal Saline if desired Phenylephrine (Sudafed) 10 mg tablets every 4 hours (or the 12-hour formulation) Diphenhydramine (Benadryl) 25 mg tablets - take max 2 tablets every 4 hours  Cough & Sore Throat Prescription cough pills or syrups as directed Dextromethorphan (Robitussin, others) - cough suppressant Guaifenesin (Robitussin, Mucinex, others) - expectorant (helps cough up mucus) (Dextromethorphan and Guaifenesin also come in a combination tablet) Lozenges w/ Benzocaine + Menthol (Cepacol) Honey - as much as you want! Teas which "coat the throat" - look for ingredients Elm Bark, Licorice Root, Marshmallow Root  Other Zinc Lozenges within 24 hours of symptoms onset - mixed evidence this shortens the duration of the common cold Don't waste your money on Vitamin C or Echinacea

## 2018-09-02 ENCOUNTER — Ambulatory Visit: Payer: Medicare HMO | Admitting: Osteopathic Medicine

## 2018-09-03 ENCOUNTER — Other Ambulatory Visit: Payer: Self-pay | Admitting: Osteopathic Medicine

## 2018-09-03 DIAGNOSIS — Z720 Tobacco use: Secondary | ICD-10-CM

## 2018-09-03 MED ORDER — VARENICLINE TARTRATE 0.5 MG X 11 & 1 MG X 42 PO MISC: ORAL | 0 refills | Status: DC

## 2018-09-03 MED ORDER — VARENICLINE TARTRATE 1 MG PO TABS: 1.0000 mg | ORAL_TABLET | Freq: Two times a day (BID) | ORAL | 1 refills | Status: DC

## 2018-09-16 ENCOUNTER — Other Ambulatory Visit: Payer: Self-pay | Admitting: Osteopathic Medicine

## 2018-09-19 ENCOUNTER — Telehealth: Payer: Self-pay | Admitting: Osteopathic Medicine

## 2018-09-19 NOTE — Telephone Encounter (Signed)
Received samples in the office and called patiens daughter to let her know the samples were in the office. She stated that she would pick them up soon.   Starter Pack (1) [id number] LOT: [id number] EXP: 11/08/2020  Maintenance dose (2) JOI:7867-6720-94 BSJ:[id number] EXP:02/05/2021.

## 2018-10-07 ENCOUNTER — Other Ambulatory Visit: Payer: Self-pay | Admitting: Osteopathic Medicine

## 2018-10-07 DIAGNOSIS — K279 Peptic ulcer, site unspecified, unspecified as acute or chronic, without hemorrhage or perforation: Secondary | ICD-10-CM

## 2018-10-08 DIAGNOSIS — K5900 Constipation, unspecified: Secondary | ICD-10-CM | POA: Diagnosis not present

## 2018-10-08 DIAGNOSIS — R112 Nausea with vomiting, unspecified: Secondary | ICD-10-CM | POA: Diagnosis not present

## 2018-10-08 DIAGNOSIS — K210 Gastro-esophageal reflux disease with esophagitis: Secondary | ICD-10-CM | POA: Diagnosis not present

## 2018-10-12 ENCOUNTER — Other Ambulatory Visit: Payer: Self-pay | Admitting: Osteopathic Medicine

## 2018-10-12 DIAGNOSIS — E0590 Thyrotoxicosis, unspecified without thyrotoxic crisis or storm: Secondary | ICD-10-CM

## 2018-10-14 NOTE — Telephone Encounter (Signed)
[facility] requesting med refill for propylthiouracil. Last thyroid lab check was 02/20/18. Pls advise, thanks.

## 2018-10-15 ENCOUNTER — Ambulatory Visit (INDEPENDENT_AMBULATORY_CARE_PROVIDER_SITE_OTHER): Payer: Medicare HMO | Admitting: Osteopathic Medicine

## 2018-10-15 ENCOUNTER — Encounter: Payer: Self-pay | Admitting: Osteopathic Medicine

## 2018-10-15 VITALS — BP 139/81 | HR 96 | Temp 97.9°F | Wt 131.8 lb

## 2018-10-15 DIAGNOSIS — K5901 Slow transit constipation: Secondary | ICD-10-CM | POA: Diagnosis not present

## 2018-10-15 DIAGNOSIS — M5030 Other cervical disc degeneration, unspecified cervical region: Secondary | ICD-10-CM | POA: Diagnosis not present

## 2018-10-15 DIAGNOSIS — M48061 Spinal stenosis, lumbar region without neurogenic claudication: Secondary | ICD-10-CM | POA: Insufficient documentation

## 2018-10-15 DIAGNOSIS — M48062 Spinal stenosis, lumbar region with neurogenic claudication: Secondary | ICD-10-CM

## 2018-10-15 DIAGNOSIS — F17200 Nicotine dependence, unspecified, uncomplicated: Secondary | ICD-10-CM

## 2018-10-15 DIAGNOSIS — M25551 Pain in right hip: Secondary | ICD-10-CM

## 2018-10-15 DIAGNOSIS — E0590 Thyrotoxicosis, unspecified without thyrotoxic crisis or storm: Secondary | ICD-10-CM

## 2018-10-15 MED ORDER — MAGNESIUM OXIDE 400 MG PO TABS: 800.0000 mg | ORAL_TABLET | Freq: Every day | ORAL | 3 refills | Status: DC

## 2018-10-15 MED ORDER — PREDNISONE 50 MG PO TABS: ORAL_TABLET | ORAL | 0 refills | Status: DC

## 2018-10-15 NOTE — Progress Notes (Signed)
Subjective:    CC: Back and leg pain  HPI: This is a pleasant 85 year old female, I saw her about 4 years ago for her hips and back.  More recently she is been having pain in the right side of her low back with radiation down both legs, cramping at night, as well as cramping in the legs when walking.  No bowel or bladder dysfunction that is new, no saddle numbness, no constitutional symptoms or trauma.  I reviewed the past medical history, family history, social history, surgical history, and allergies today and no changes were needed.  Please see the problem list section below in epic for further details.  Past Medical History: Past Medical History:  Diagnosis Date  . Allergy   . Cancer ([place])    skin  . Colitis, ischemic ([place])   . Depression   . Fibromyalgia   . GERD (gastroesophageal reflux disease)   . Hyperthyroidism 2010   sees Dr Marcello Moores, did RAI  . Peripheral vascular disease ([place])   . Shingles    Past Surgical History: Past Surgical History:  Procedure Laterality Date  . ABDOMINAL HYSTERECTOMY  1975   for dub  . APPENDECTOMY  1939  . BREAST BIOPSY  1954  . [place]   right  . CATARACT EXTRACTION  2009   right and left.   . CERVICAL FUSION    . [place]  . ORIF ANKLE FRACTURE  1979   left  . TONSILLECTOMY     Social History: Social History   Socioeconomic History  . Marital status: Single    Spouse name: Not on file  . Number of children: 3  . Years of education: college  . Highest education level: 12th grade  Occupational History  . Occupation: retired    Comment: LPN  Social Needs  . Financial resource strain: Not hard at all  . Food insecurity:    Worry: Never true    Inability: Never true  . Transportation needs:    Medical: No    Non-medical: No  Tobacco Use  . Smoking status: Current Every Day Smoker    Packs/day: 1.00    Years: 50.00    Pack years: 50.00    Types: Cigarettes  . Smokeless tobacco:  Never Used  . Tobacco comment: pt states that she has been trying to quit but can't seem to quit  Substance and Sexual Activity  . Alcohol use: No    Frequency: Never  . Drug use: No  . Sexual activity: Not Currently  Lifestyle  . Physical activity:    Days per week: 0 days    Minutes per session: 0 min  . Stress: Not at all  Relationships  . Social connections:    Talks on phone: Once a week    Gets together: Never    Attends religious service: Never    Active member of club or organization: No    Attends meetings of clubs or organizations: Never    Relationship status: Not on file  Other Topics Concern  . Not on file  Social History Narrative  . Not on file   Family History: Family History  Problem Relation Age of Onset  . Hypertension Sister   . Cancer Daughter   . Hyperlipidemia Daughter    Allergies: Allergies  Allergen Reactions  . Amitriptyline Other (See Comments)  . Amoxicillin-Pot Clavulanate Nausea And Vomiting    Vomiting   . Bupropion Other (See Comments)  constipation   . Citalopram Hydrobromide Other (See Comments)  . Codeine Nausea And Vomiting and Nausea Only  . Erythromycin Nausea And Vomiting  . Escitalopram Oxalate Nausea And Vomiting  . Methimazole Other (See Comments)  . Neomycin-Bacitracin Zn-Polymyx Other (See Comments)  . Neomycin-Bacitracin-Polymyxin  [Bacitracin-Neomycin-Polymyxin] Other (See Comments)  . Other Nausea And Vomiting, Nausea Only and Rash    All Mycins  . Pentazocine Nausea And Vomiting  . Pitavastatin Other (See Comments)    Headache.   . Salsalate Other (See Comments)  . Tetracyclines & Related Rash  . Azithromycin   . Crestor [Rosuvastatin Calcium] Nausea Only  . Elavil [Amitriptyline Hcl]   . Livalo [Pitavastatin Calcium] Other (See Comments)    Headache.   . Naproxen     nausea  . Neosporin [Neomycin-Polymyxin-Gramicidin]   . Prozac [Fluoxetine]   . Rosuvastatin    Medications: See med rec.  Review of  Systems: No fevers, chills, night sweats, weight loss, chest pain, or shortness of breath.   Objective:    General: Well Developed, well nourished, and in no acute distress.  Neuro: Alert and oriented x3, extra-ocular muscles intact, sensation grossly intact.  HEENT: Normocephalic, atraumatic, pupils equal round reactive to light, neck supple, no masses, no lymphadenopathy, thyroid nonpalpable.  Skin: Warm and dry, no rashes. Cardiac: Regular rate and rhythm, no murmurs rubs or gallops, no lower extremity edema.  Respiratory: Clear to auscultation bilaterally. Not using accessory muscles, speaking in full sentences.  Impression and Recommendations:    Lumbar spinal stenosis Cramping at night, adding magnesium oxide. 5 days of prednisone. Home rehab exercises, return to see me in 6 weeks, if insufficient improvement we will proceed with an MRI for epidural planning. ___________________________________________ Gwen Her. Dianah Field, M.D., ABFM., CAQSM. Primary Care and Sports Medicine [facility]  Adjunct Professor of [place] of [employer]

## 2018-10-15 NOTE — Telephone Encounter (Signed)
Pt will be updated of med refill request during their OV with provider today.

## 2018-10-15 NOTE — Patient Instructions (Signed)
Constipation can happen in 5-8% of people who take Chantix.   Patient education: Constipation in adults   CONSTIPATION OVERVIEW - Constipation is a very common problem. Each year more than 2.5 million Americans visit their healthcare provider for relief from this problem. Many factors can contribute to or cause constipation, although in most people, no single cause can be found. In general, constipation occurs more frequently as you get older.  When to seek help - Most people can treat constipation at home, without seeing a healthcare provider. However, you should speak with a healthcare provider if the problem: ?Is new (ie, represents a change in your normal pattern) ?Lasts longer than three weeks ?Is severe ?Is associated with any other concerning features such as blood on the toilet paper, weight loss, fevers, or weakness   CONSTIPATION TREATMENT - Treatment for constipation includes changing some behaviors, eating foods high in fiber, and using laxatives or enemas if needed.  Behavior changes - The bowels are most active following meals, and this is often the time when stools will pass most readily. If you ignore your body's signals to have a bowel movement, the signals become weaker and weaker over time. By paying close attention to these signals, you may have an easier time moving your bowels. Drinking a caffeine-containing beverage in the morning may also be helpful.  Increase fiber - Increasing fiber in your diet may reduce or eliminate constipation. The recommended amount of dietary fiber is 20 to 35 grams of fiber per day. By reading the product information panel on the side of the package, you can determine the number of grams of fiber per serving Many fruits and vegetables can be particularly helpful in preventing and treating constipation. This is especially true of citrus fruits, prunes, and prune juice. Some breakfast cereals are also an excellent source of dietary fiber.  Fiber  side effects - Consuming large amounts of fiber can cause abdominal bloating or gas; this can be minimized by starting with a small amount and slowly increasing until stools become softer and more frequent.   LAXATIVES - If behavior changes and increasing fiber does not relieve your constipation, you may try taking a laxative. A variety of laxatives are available for treating constipation. The choice between them is based upon how they work, how safe the treatment is, and your healthcare provider's preferences. In general, laxatives can be categorized into the following groups:  Bulk forming laxatives - These include natural fiber and commercial fiber preparations such as: ?Psyllium (Konsyl; Metamucil; Perdiem) ?Methylcellulose (Citrucel) ?Calcium polycarbophil (FiberCon; Fiber-Lax; Mitrolan) ?Wheat dextrin (Benefiber) You should increase the dose of fiber supplements slowly to prevent gas and cramping, and you should always take the supplement with plenty of fluid.  Hyperosmolar laxatives - Hyperosmolar laxatives include: ?Polyethylene glycol (MiraLax, Glycolax) ?Lactulose ?Sorbitol Polyethylene glycol is generally preferred since it does not cause gas or bloating and is available in the [place] without a prescription. Lactulose and sorbitol can produce gas and bloating. Sorbitol works as well as lactulose and is much less expensive.  Saline laxatives - Saline laxatives such as magnesium hydroxide (Milk of Magnesia) and magnesium citrate (Evac-Q-Mag) act similarly to the hyperosmolar laxatives.  Stimulant laxatives - Stimulant laxatives include senna (eg, Black Draught, Ex-lax, Fletcher's, [place], Senokot) and bisacodyl (eg, Correctol, Doxidan, Dulcolax). Some people overuse stimulant laxatives. Taking stimulant laxatives regularly or in large amounts can cause side effects, including low potassium levels. Thus, you should take these drugs carefully if you must use them  regularly.  However, there is no convincing evidence that using stimulant laxatives regularly damages the colon, and they do not increase the risk for colorectal cancer or other tumors.  New treatments - Lubiprostone (Amitiza) is a prescription medication that treats severe chronic constipation. It is expensive, but it may be recommended if you do not respond to other treatments. Linaclotide (Linzess) is another prescription medication that has been approved for the treatment of chronic constipation. It is also an expensive medication as compared with other agents with the exception of lubiprostone. Linaclotide may be recommended if you do not respond to other treatments.   Constipation treatments to avoid ?Emollients - Emollient laxatives, principally mineral oil, soften stools by moisturizing them. However, other treatments have fewer risks and equal benefit. ?Natural products - A wide variety of natural products are advertised for constipation. Some of them contain the active ingredients found in commercially available laxatives. However, their dose and purity may not be carefully controlled. Thus, these products are not generally recommended.  A variety of home-made enema preparations have been used throughout the years, such as soapsuds, hydrogen peroxide, and household detergents. These can be extremely irritating to the lining of the intestine and should be avoided.

## 2018-10-15 NOTE — Progress Notes (Signed)
HPI: Heather Perkins is a 85 y.o. female who  has a past medical history of Allergy, Cancer ([place]), Colitis, ischemic ([place]), Depression, Fibromyalgia, GERD (gastroesophageal reflux disease), Hyperthyroidism (2010), Peripheral vascular disease ([place]), and Shingles.  she presents to [facility], 10/15/18,  for chief complaint of:  Constipation Chantix problem  Question if she needs thyroid levels checked. Last checked 02/2018 and WNL  Continues to have bilateral hip and leg pain, worse on the R. See Dr T note for full details.   Concern for constipation, she had gone 5 days w/o BM last week, self-treatment w/ OTC Dulcolax results in BM 2 days ago and has been regular since then. She worries Chantix might be the cause of constipation, but she's done well cutting back on cigarettes so she doesn't want to come off this medicine. She reports that she chronically has constipation issues, past few years. She takes metamucil daily. Reports getting adequate hydration but minimal exercise/activity. Not eating much fruits/veg/whole grains   Patient is accompanied by daughter who assists with history-taking.    At today's visit... Past medical history, surgical history, and family history reviewed and updated as needed.  Current medication list and allergy/intolerance information reviewed and updated as needed. (See remainder of HPI, ROS, Phys Exam below)           ASSESSMENT/PLAN: The primary encounter diagnosis was Slow transit constipation. Diagnoses of Right hip pain, DDD (degenerative disc disease), cervical, Tobacco dependence, Hyperthyroidism, and Spinal stenosis of lumbar region with neurogenic claudication were also pertinent to this visit.   Pt info printed on self-management of constipation, she'd like to trial lifestyle modifications before attempting prescriptions, which I think is reasonable.     Follow-up plan: Return for [place] 02/2019.                             ############################################ ############################################ ############################################ ############################################    Current Meds  Medication Sig  . albuterol (PROVENTIL HFA;VENTOLIN HFA) 108 (90 Base) MCG/ACT inhaler Inhale 2 puffs into the lungs every 6 (six) hours as needed for wheezing or shortness of breath.  . ALPRAZolam (XANAX) 0.5 MG tablet TAKE 1/2 TO 1 TABLET BY MOUTH TWICE DAILY AS NEEDED FOR SEVERE ANXIETY  . aspirin EC 81 MG tablet Take 1 tablet (81 mg total) by mouth daily.  . B Complex Vitamins (B COMPLEX PO) Take by mouth.  . benzonatate (TESSALON) 200 MG capsule Take 1 capsule (200 mg total) by mouth 3 (three) times daily as needed for cough.  . Calcium-Magnesium-Zinc 500-250-12.5 MG TABS Take by mouth daily. TAKE 1 CAPSULE BY MOUTH 2 TIMES DAILY  . cilostazol (PLETAL) 100 MG tablet Take 1 tablet (100 mg total) by mouth 2 (two) times daily.  . cyclobenzaprine (FLEXERIL) 10 MG tablet TAKE ONE TABLET BY MOUTH 3 TIMES DAILY AS NEEDED FOR MUSCLE SPASMS  . fish oil-omega-3 fatty acids 1000 MG capsule Take 2 g by mouth daily.  . fluticasone (FLONASE) 50 MCG/ACT nasal spray PLACE 2 SPRAYS INTO BOTH NOSTRILS DAILY  . HYDROcodone-acetaminophen (NORCO) 10-325 MG tablet Take 0.5-1 tablets by mouth every 8 (eight) hours as needed.  Marland Kitchen omeprazole (PRILOSEC) 20 MG capsule Take 1 capsule (20 mg total) by mouth 2 (two) times daily before a meal.  . polyethylene glycol (MIRALAX / GLYCOLAX) packet Take 17 g by mouth daily.    . pravastatin (PRAVACHOL) 40 MG  tablet Take 1 tablet (40 mg total) by mouth daily.  . predniSONE (DELTASONE) 20 MG tablet Take 1 tablet (20 mg total) by mouth 2 (two) times daily with a meal.  . propylthiouracil (PTU) 50 MG tablet TAKE 1 TABLET(50 MG) BY MOUTH TWICE DAILY  . ranitidine (ZANTAC) 300 MG tablet TAKE 1  TABLET BY MOUTH EVERY NIGHT AT BEDTIME  . SPIRIVA RESPIMAT 2.5 MCG/ACT AERS 2 INHALATIONS DAILY TO HELP REDUCE COUGH  . varenicline (CHANTIX CONTINUING MONTH PAK) 1 MG tablet Take 1 tablet (1 mg total) by mouth 2 (two) times daily.  . varenicline (CHANTIX STARTING MONTH PAK) 0.5 MG X 11 & 1 MG X 42 tablet Take one 0.5 mg tablet by mouth once daily for 3 days, then increase to one 0.5 mg tablet twice daily for 4 days, then increase to one 1 mg tablet twice daily.    Allergies  Allergen Reactions  . Amitriptyline Other (See Comments)  . Amoxicillin-Pot Clavulanate Nausea And Vomiting    Vomiting   . Bupropion Other (See Comments)    constipation   . Citalopram Hydrobromide Other (See Comments)  . Codeine Nausea And Vomiting and Nausea Only  . Erythromycin Nausea And Vomiting  . Escitalopram Oxalate Nausea And Vomiting  . Methimazole Other (See Comments)  . Neomycin-Bacitracin Zn-Polymyx Other (See Comments)  . Neomycin-Bacitracin-Polymyxin  [Bacitracin-Neomycin-Polymyxin] Other (See Comments)  . Other Nausea And Vomiting, Nausea Only and Rash    All Mycins  . Pentazocine Nausea And Vomiting  . Pitavastatin Other (See Comments)    Headache.   . Salsalate Other (See Comments)  . Tetracyclines & Related Rash  . Azithromycin   . Crestor [Rosuvastatin Calcium] Nausea Only  . Elavil [Amitriptyline Hcl]   . Livalo [Pitavastatin Calcium] Other (See Comments)    Headache.   . Naproxen     nausea  . Neosporin [Neomycin-Polymyxin-Gramicidin]   . Prozac [Fluoxetine]   . Rosuvastatin        Review of Systems:  Constitutional: No recent illness  HEENT: No  headache, no vision change  Cardiac: No  chest pain, No  pressure, No palpitations  Respiratory:  No  shortness of breath. No  Cough  Gastrointestinal: No  abdominal pain, +change on bowel habits  Musculoskeletal: No new myalgia/arthralgia, chronic hip/back pain as per HPI  Skin: No  Rash  Psychiatric: No  concerns with  depression, No  concerns with anxiety  Exam:  BP 139/81 (BP Location: Left Arm, Patient Position: Sitting, Cuff Size: Normal)   Pulse 96   Temp 97.9 F (36.6 C) (Oral)   Wt 131 lb 12.8 oz (59.8 kg)   BMI 24.11 kg/m   Constitutional: VS see above. General Appearance: alert, well-developed, well-nourished, NAD  Eyes: Normal lids and conjunctive, non-icteric sclera  Ears, Nose, Mouth, Throat: MMM, Normal external inspection ears/nares/mouth/lips/gums.  Neck: No masses, trachea midline.   Respiratory: Normal respiratory effort. no wheeze, no rhonchi, no rales  Cardiovascular: S1/S2 normal, no murmur, no rub/gallop auscultated. RRR.   Musculoskeletal: Gait normal. Symmetric and independent movement of all extremities. (+)pain w/ external rotation both hips, (+)tenderness R trochanteric bursa, neg SLR bilaterally   Abdominal: non-tender, non-distended, no appreciable organomegaly, neg Murphy's, BS WNLx4  Neurological: Normal balance/coordination. No tremor.  Skin: warm, dry, intact.   Psychiatric: Normal judgment/insight. Normal mood and affect. Oriented x3.       Visit summary with medication list and pertinent instructions was printed for patient to review, patient was advised to alert [place] if  any updates are needed. All questions at time of visit were answered - patient instructed to contact office with any additional concerns. ER/RTC precautions were reviewed with the patient and understanding verbalized.   Note: Total time spent 25 minutes, greater than 50% of the visit was spent face-to-face counseling and coordinating care for the following: The primary encounter diagnosis was Slow transit constipation. Diagnoses of Right hip pain, DDD (degenerative disc disease), cervical, Tobacco dependence, Hyperthyroidism, and Spinal stenosis of lumbar region with neurogenic claudication were also pertinent to this visit.Marland Kitchen  Please note: voice recognition software was used to produce this  document, and typos may escape review. Please contact Dr. Sheppard Coil for any needed clarifications.    Follow up plan: Return for [place] 02/2019.

## 2018-10-15 NOTE — Assessment & Plan Note (Signed)
Cramping at night, adding magnesium oxide. 5 days of prednisone. Home rehab exercises, return to see me in 6 weeks, if insufficient improvement we will proceed with an MRI for epidural planning.

## 2018-10-16 ENCOUNTER — Encounter: Payer: Self-pay | Admitting: Osteopathic Medicine

## 2018-10-24 ENCOUNTER — Ambulatory Visit (INDEPENDENT_AMBULATORY_CARE_PROVIDER_SITE_OTHER): Payer: Medicare HMO | Admitting: Sports Medicine

## 2018-10-24 ENCOUNTER — Encounter: Payer: Self-pay | Admitting: Sports Medicine

## 2018-10-24 DIAGNOSIS — M791 Myalgia, unspecified site: Secondary | ICD-10-CM | POA: Diagnosis not present

## 2018-10-24 MED ORDER — ACETAMINOPHEN ER 650 MG PO TBCR: 650.0000 mg | EXTENDED_RELEASE_TABLET | Freq: Three times a day (TID) | ORAL | 3 refills | Status: DC | PRN

## 2018-10-24 MED ORDER — IBUPROFEN 800 MG PO TABS: 800.0000 mg | ORAL_TABLET | Freq: Three times a day (TID) | ORAL | 2 refills | Status: DC | PRN

## 2018-10-24 NOTE — Progress Notes (Signed)
Subjective:    CC: Painful all over  HPI: This is a pleasant 85 year old female, for the past week she has noted pain that she describes from the top of her head down to the tip of her toes.  Aching in her extremities.  On further questioning she did have some sniffles, sore throat, cough, runny nose recently, mild chills without measured fevers.  Overall her symptoms are improving, but she does have significant myalgias.  No change in her urine, no constitutional symptoms now.  No shortness of breath or chest pain.  She does have a bit of depressed mood without suicidal ideation.  I reviewed the past medical history, family history, social history, surgical history, and allergies today and no changes were needed.  Please see the problem list section below in epic for further details.  Past Medical History: Past Medical History:  Diagnosis Date  . Allergy   . Cancer ([place])    skin  . Colitis, ischemic ([place])   . Depression   . Fibromyalgia   . GERD (gastroesophageal reflux disease)   . Hyperthyroidism 2010   sees Dr Marcello Moores, did RAI  . Peripheral vascular disease ([place])   . Shingles    Past Surgical History: Past Surgical History:  Procedure Laterality Date  . ABDOMINAL HYSTERECTOMY  1975   for dub  . APPENDECTOMY  1939  . BREAST BIOPSY  1954  . [place]   right  . CATARACT EXTRACTION  2009   right and left.   . CERVICAL FUSION    . [place]  . ORIF ANKLE FRACTURE  1979   left  . TONSILLECTOMY     Social History: Social History   Socioeconomic History  . Marital status: Single    Spouse name: Not on file  . Number of children: 3  . Years of education: college  . Highest education level: 12th grade  Occupational History  . Occupation: retired    Comment: LPN  Social Needs  . Financial resource strain: Not hard at all  . Food insecurity:    Worry: Never true    Inability: Never true  . Transportation needs:    Medical: No    Non-medical: No  Tobacco Use  . Smoking status: Current Every Day Smoker    Packs/day: 1.00    Years: 50.00    Pack years: 50.00    Types: Cigarettes  . Smokeless tobacco: Never Used  . Tobacco comment: pt states that she has been trying to quit but can't seem to quit  Substance and Sexual Activity  . Alcohol use: No    Frequency: Never  . Drug use: No  . Sexual activity: Not Currently  Lifestyle  . Physical activity:    Days per week: 0 days    Minutes per session: 0 min  . Stress: Not at all  Relationships  . Social connections:    Talks on phone: Once a week    Gets together: Never    Attends religious service: Never    Active member of club or organization: No    Attends meetings of clubs or organizations: Never    Relationship status: Not on file  Other Topics Concern  . Not on file  Social History Narrative  . Not on file   Family History: Family History  Problem Relation Age of Onset  . Hypertension Sister   . Cancer Daughter   . Hyperlipidemia Daughter    Allergies: Allergies  Allergen Reactions  . Amitriptyline Other (See Comments)  . Amoxicillin-Pot Clavulanate Nausea And Vomiting    Vomiting   . Bupropion Other (See Comments)    constipation   . Citalopram Hydrobromide Other (See Comments)  . Codeine Nausea And Vomiting and Nausea Only  . Erythromycin Nausea And Vomiting  . Escitalopram Oxalate Nausea And Vomiting  . Methimazole Other (See Comments)  . Neomycin-Bacitracin Zn-Polymyx Other (See Comments)  . Neomycin-Bacitracin-Polymyxin  [Bacitracin-Neomycin-Polymyxin] Other (See Comments)  . Other Nausea And Vomiting, Nausea Only and Rash    All Mycins  . Pentazocine Nausea And Vomiting  . Pitavastatin Other (See Comments)    Headache.   . Salsalate Other (See Comments)  . Tetracyclines & Related Rash  . Azithromycin   . Crestor [Rosuvastatin Calcium] Nausea Only  . Elavil [Amitriptyline Hcl]   . Livalo [Pitavastatin Calcium] Other (See  Comments)    Headache.   . Naproxen     nausea  . Neosporin [Neomycin-Polymyxin-Gramicidin]   . Prozac [Fluoxetine]   . Rosuvastatin    Medications: See med rec.  Review of Systems: No fevers, chills, night sweats, weight loss, chest pain, or shortness of breath.   Objective:    General: Well Developed, well nourished, and in no acute distress.  Neuro: Alert and oriented x3, extra-ocular muscles intact, sensation grossly intact.  HEENT: Normocephalic, atraumatic, pupils equal round reactive to light, neck supple, no masses, no lymphadenopathy, thyroid nonpalpable.  Skin: Warm and dry, no rashes. Cardiac: Regular rate and rhythm, no murmurs rubs or gallops, no lower extremity edema.  Respiratory: Clear to auscultation bilaterally. Not using accessory muscles, speaking in full sentences.  Impression and Recommendations:    Myalgia Myalgias, she did just get over an upper respiratory infection so I think this is simply viral. Adding ibuprofen 800, arthritis strength Tylenol both 3 times daily. Her pain is somewhat out of proportion with her presentation, so we are going to add CK levels, CBC, CMP. She also is feeling somewhat depressed so if no improvement over the next couple of weeks I recommend that the PCP add Cymbalta.   She is agreeable to this entire plan. ___________________________________________ Gwen Her. Dianah Field, M.D., ABFM., CAQSM. Primary Care and Sports Medicine [facility]  Adjunct Professor of [place] of [facility] [employer]

## 2018-10-24 NOTE — Assessment & Plan Note (Signed)
Myalgias, she did just get over an upper respiratory infection so I think this is simply viral. Adding ibuprofen 800, arthritis strength Tylenol both 3 times daily. Her pain is somewhat out of proportion with her presentation, so we are going to add CK levels, CBC, CMP. She also is feeling somewhat depressed so if no improvement over the next couple of weeks I recommend that the PCP add Cymbalta.   She is agreeable to this entire plan.

## 2018-10-25 ENCOUNTER — Other Ambulatory Visit: Payer: Self-pay | Admitting: Osteopathic Medicine

## 2018-10-25 DIAGNOSIS — I739 Peripheral vascular disease, unspecified: Secondary | ICD-10-CM

## 2018-10-25 LAB — COMPREHENSIVE METABOLIC PANEL
AG Ratio: 1.8 (calc) (ref 1.0–2.5)
ALT: 9 U/L (ref 6–29)
AST: 14 U/L (ref 10–35)
Albumin: 4 g/dL (ref 3.6–5.1)
Alkaline phosphatase (APISO): 55 U/L (ref 33–130)
BUN: 13 mg/dL (ref 7–25)
CO2: 25 mmol/L (ref 20–32)
Calcium: 9.3 mg/dL (ref 8.6–10.4)
Chloride: 100 mmol/L (ref 98–110)
Creat: 0.85 mg/dL (ref 0.60–0.88)
Globulin: 2.2 g/dL (calc) (ref 1.9–3.7)
Glucose, Bld: 89 mg/dL (ref 65–99)
Potassium: 4.1 mmol/L (ref 3.5–5.3)
Sodium: 134 mmol/L — ABNORMAL LOW (ref 135–146)
Total Bilirubin: 0.8 mg/dL (ref 0.2–1.2)
Total Protein: 6.2 g/dL (ref 6.1–8.1)

## 2018-10-25 LAB — CBC
HCT: 40.2 % (ref 35.0–45.0)
Hemoglobin: 13.6 g/dL (ref 11.7–15.5)
MCH: 30.6 pg (ref 27.0–33.0)
MCHC: 33.8 g/dL (ref 32.0–36.0)
MCV: 90.3 fL (ref 80.0–100.0)
MPV: 11.5 fL (ref 7.5–12.5)
Platelets: 273 10*3/uL (ref 140–400)
RBC: 4.45 10*6/uL (ref 3.80–5.10)
RDW: 13 % (ref 11.0–15.0)
WBC: 10.4 10*3/uL (ref 3.8–10.8)

## 2018-10-25 LAB — CK: Total CK: 44 U/L (ref 29–143)

## 2018-10-29 ENCOUNTER — Other Ambulatory Visit: Payer: Self-pay | Admitting: Osteopathic Medicine

## 2018-10-29 DIAGNOSIS — I739 Peripheral vascular disease, unspecified: Secondary | ICD-10-CM

## 2018-10-30 ENCOUNTER — Other Ambulatory Visit: Payer: Self-pay | Admitting: Osteopathic Medicine

## 2018-10-30 MED ORDER — CILOSTAZOL 100 MG PO TABS: 100.0000 mg | ORAL_TABLET | Freq: Two times a day (BID) | ORAL | 2 refills | Status: DC

## 2018-11-11 ENCOUNTER — Telehealth: Payer: Self-pay

## 2018-11-11 MED ORDER — ALPRAZOLAM 0.5 MG PO TABS: ORAL_TABLET | ORAL | 0 refills | Status: DC

## 2018-11-11 NOTE — Telephone Encounter (Signed)
Pt's daughter called requesting a med refill for alprazolam for pt. Pls send to [facility].

## 2018-11-20 ENCOUNTER — Ambulatory Visit (INDEPENDENT_AMBULATORY_CARE_PROVIDER_SITE_OTHER): Payer: Medicare HMO | Admitting: Sports Medicine

## 2018-11-20 ENCOUNTER — Encounter: Payer: Self-pay | Admitting: Sports Medicine

## 2018-11-20 ENCOUNTER — Ambulatory Visit (INDEPENDENT_AMBULATORY_CARE_PROVIDER_SITE_OTHER): Payer: Medicare HMO

## 2018-11-20 DIAGNOSIS — M25551 Pain in right hip: Secondary | ICD-10-CM | POA: Diagnosis not present

## 2018-11-20 NOTE — Progress Notes (Signed)
Subjective:    CC: Right hip pain  HPI: Heather Perkins returns, she has right hip and back pain, localized directly over the greater trochanter without radiation.  Unable to lay on the right side.  Ibuprofen and relative rest was not effective.  Pain is severe, persistent, localized.  I reviewed the past medical history, family history, social history, surgical history, and allergies today and no changes were needed.  Please see the problem list section below in epic for further details.  Past Medical History: Past Medical History:  Diagnosis Date  . Allergy   . Cancer ([place])    skin  . Colitis, ischemic ([place])   . Depression   . Fibromyalgia   . GERD (gastroesophageal reflux disease)   . Hyperthyroidism 2010   sees Dr Marcello Moores, did RAI  . Peripheral vascular disease ([place])   . Shingles    Past Surgical History: Past Surgical History:  Procedure Laterality Date  . ABDOMINAL HYSTERECTOMY  1975   for dub  . APPENDECTOMY  1939  . BREAST BIOPSY  1954  . [place]   right  . CATARACT EXTRACTION  2009   right and left.   . CERVICAL FUSION    . [place]  . ORIF ANKLE FRACTURE  1979   left  . TONSILLECTOMY     Social History: Social History   Socioeconomic History  . Marital status: Single    Spouse name: Not on file  . Number of children: 3  . Years of education: college  . Highest education level: 12th grade  Occupational History  . Occupation: retired    Comment: LPN  Social Needs  . Financial resource strain: Not hard at all  . Food insecurity:    Worry: Never true    Inability: Never true  . Transportation needs:    Medical: No    Non-medical: No  Tobacco Use  . Smoking status: Current Every Day Smoker    Packs/day: 1.00    Years: 50.00    Pack years: 50.00    Types: Cigarettes  . Smokeless tobacco: Never Used  . Tobacco comment: pt states that she has been trying to quit but can't seem to quit  Substance and Sexual Activity    . Alcohol use: No    Frequency: Never  . Drug use: No  . Sexual activity: Not Currently  Lifestyle  . Physical activity:    Days per week: 0 days    Minutes per session: 0 min  . Stress: Not at all  Relationships  . Social connections:    Talks on phone: Once a week    Gets together: Never    Attends religious service: Never    Active member of club or organization: No    Attends meetings of clubs or organizations: Never    Relationship status: Not on file  Other Topics Concern  . Not on file  Social History Narrative  . Not on file   Family History: Family History  Problem Relation Age of Onset  . Hypertension Sister   . Cancer Daughter   . Hyperlipidemia Daughter    Allergies: Allergies  Allergen Reactions  . Amitriptyline Other (See Comments)  . Amoxicillin-Pot Clavulanate Nausea And Vomiting    Vomiting   . Bupropion Other (See Comments)    constipation   . Citalopram Hydrobromide Other (See Comments)  . Codeine Nausea And Vomiting and Nausea Only  . Erythromycin Nausea And Vomiting  . Escitalopram  Oxalate Nausea And Vomiting  . Methimazole Other (See Comments)  . Neomycin-Bacitracin Zn-Polymyx Other (See Comments)  . Neomycin-Bacitracin-Polymyxin  [Bacitracin-Neomycin-Polymyxin] Other (See Comments)  . Other Nausea And Vomiting, Nausea Only and Rash    All Mycins  . Pentazocine Nausea And Vomiting  . Pitavastatin Other (See Comments)    Headache.   . Salsalate Other (See Comments)  . Tetracyclines & Related Rash  . Azithromycin   . Crestor [Rosuvastatin Calcium] Nausea Only  . Elavil [Amitriptyline Hcl]   . Livalo [Pitavastatin Calcium] Other (See Comments)    Headache.   . Naproxen     nausea  . Neosporin [Neomycin-Polymyxin-Gramicidin]   . Prozac [Fluoxetine]   . Rosuvastatin    Medications: See med rec.  Review of Systems: No fevers, chills, night sweats, weight loss, chest pain, or shortness of breath.   Objective:    General: Well  Developed, well nourished, and in no acute distress.  Neuro: Alert and oriented x3, extra-ocular muscles intact, sensation grossly intact.  HEENT: Normocephalic, atraumatic, pupils equal round reactive to light, neck supple, no masses, no lymphadenopathy, thyroid nonpalpable.  Skin: Warm and dry, no rashes. Cardiac: Regular rate and rhythm, no murmurs rubs or gallops, no lower extremity edema.  Respiratory: Clear to auscultation bilaterally. Not using accessory muscles, speaking in full sentences. Right hip: ROM IR: 60 Deg, ER: 60 Deg, Flexion: 120 Deg, Extension: 100 Deg, Abduction: 45 Deg, Adduction: 45 Deg Strength IR: 5/5, ER: 5/5, Flexion: 5/5, Extension: 5/5, Abduction: 5/5, Adduction: 5/5 Pelvic alignment unremarkable to inspection and palpation. Standing hip rotation and gait without trendelenburg / unsteadiness. Greater trochanter with tenderness to palpation. No tenderness over piriformis. No SI joint tenderness and normal minimal SI movement.  Procedure: Real-time Ultrasound Guided Injection of right greater trochanteric bursa Device: GE Logiq E  Verbal informed consent obtained.  Time-out conducted.  Noted no overlying erythema, induration, or other signs of local infection.  Skin prepped in a sterile fashion.  Local anesthesia: Topical Ethyl chloride.  With sterile technique and under real time ultrasound guidance: Using a 22-gauge spinal needle 1 cc Kenalog 40, 2 cc lidocaine, 2 cc bupivacaine injected easily Completed without difficulty  Pain immediately resolved suggesting accurate placement of the medication.  Advised to call if fevers/chills, erythema, induration, drainage, or persistent bleeding.  Images permanently stored and available for review in the ultrasound unit.  Impression: Technically successful ultrasound guided injection.  Impression and Recommendations:    Right hip pain Pain today is referrable to the greater trochanteric bursa. She does have some  hip joint osteoarthritis. Considering failure of conservative measures and prescription strength NSAIDs we are going to proceed with a right greater trochanteric bursa injection. Repeat x-rays. Return to see me in 1 month. ___________________________________________ Gwen Her. Dianah Field, M.D., ABFM., CAQSM. Primary Care and Sports Medicine [facility]  Adjunct Professor of [place] of [facility] of Medicine

## 2018-11-20 NOTE — Assessment & Plan Note (Signed)
Pain today is referrable to the greater trochanteric bursa. She does have some hip joint osteoarthritis. Considering failure of conservative measures and prescription strength NSAIDs we are going to proceed with a right greater trochanteric bursa injection. Repeat x-rays. Return to see me in 1 month.

## 2018-11-26 ENCOUNTER — Encounter: Payer: Medicare HMO | Admitting: Sports Medicine

## 2018-11-28 ENCOUNTER — Ambulatory Visit (INDEPENDENT_AMBULATORY_CARE_PROVIDER_SITE_OTHER): Payer: Medicare HMO | Admitting: Sports Medicine

## 2018-11-28 ENCOUNTER — Encounter: Payer: Self-pay | Admitting: Sports Medicine

## 2018-11-28 ENCOUNTER — Ambulatory Visit (INDEPENDENT_AMBULATORY_CARE_PROVIDER_SITE_OTHER): Payer: Medicare HMO

## 2018-11-28 DIAGNOSIS — M4807 Spinal stenosis, lumbosacral region: Secondary | ICD-10-CM | POA: Diagnosis not present

## 2018-11-28 DIAGNOSIS — M48062 Spinal stenosis, lumbar region with neurogenic claudication: Secondary | ICD-10-CM

## 2018-11-28 DIAGNOSIS — M545 Low back pain: Secondary | ICD-10-CM | POA: Diagnosis not present

## 2018-11-28 MED ORDER — GABAPENTIN 300 MG PO CAPS: ORAL_CAPSULE | ORAL | 3 refills | Status: DC

## 2018-11-28 NOTE — Assessment & Plan Note (Signed)
Persistent pain, adding x-rays, MRI for epidural planning. Gabapentin. Return for MRI results.

## 2018-11-28 NOTE — Progress Notes (Addendum)
Subjective:    CC: Back pain  HPI: This is a pleasant 85 year old female, unfortunately she continues to have back pain in spite of conservative measures, moderate, persistent, localized without radiation, no bowel or bladder dysfunction, saddle numbness, constitutional symptoms.  I reviewed the past medical history, family history, social history, surgical history, and allergies today and no changes were needed.  Please see the problem list section below in epic for further details.  Past Medical History: Past Medical History:  Diagnosis Date  . Allergy   . Cancer ([place])    skin  . Colitis, ischemic ([place])   . Depression   . Fibromyalgia   . GERD (gastroesophageal reflux disease)   . Hyperthyroidism 2010   sees Dr Marcello Moores, did RAI  . Peripheral vascular disease ([place])   . Shingles    Past Surgical History: Past Surgical History:  Procedure Laterality Date  . ABDOMINAL HYSTERECTOMY  1975   for dub  . APPENDECTOMY  1939  . BREAST BIOPSY  1954  . [place]   right  . CATARACT EXTRACTION  2009   right and left.   . CERVICAL FUSION    . [place]  . ORIF ANKLE FRACTURE  1979   left  . TONSILLECTOMY     Social History: Social History   Socioeconomic History  . Marital status: Single    Spouse name: Not on file  . Number of children: 3  . Years of education: college  . Highest education level: 12th grade  Occupational History  . Occupation: retired    Comment: LPN  Social Needs  . Financial resource strain: Not hard at all  . Food insecurity:    Worry: Never true    Inability: Never true  . Transportation needs:    Medical: No    Non-medical: No  Tobacco Use  . Smoking status: Current Every Day Smoker    Packs/day: 1.00    Years: 50.00    Pack years: 50.00    Types: Cigarettes  . Smokeless tobacco: Never Used  . Tobacco comment: pt states that she has been trying to quit but can't seem to quit  Substance and Sexual  Activity  . Alcohol use: No    Frequency: Never  . Drug use: No  . Sexual activity: Not Currently  Lifestyle  . Physical activity:    Days per week: 0 days    Minutes per session: 0 min  . Stress: Not at all  Relationships  . Social connections:    Talks on phone: Once a week    Gets together: Never    Attends religious service: Never    Active member of club or organization: No    Attends meetings of clubs or organizations: Never    Relationship status: Not on file  Other Topics Concern  . Not on file  Social History Narrative  . Not on file   Family History: Family History  Problem Relation Age of Onset  . Hypertension Sister   . Cancer Daughter   . Hyperlipidemia Daughter    Allergies: Allergies  Allergen Reactions  . Amitriptyline Other (See Comments)  . Amoxicillin-Pot Clavulanate Nausea And Vomiting    Vomiting   . Bupropion Other (See Comments)    constipation   . Citalopram Hydrobromide Other (See Comments)  . Codeine Nausea And Vomiting and Nausea Only  . Erythromycin Nausea And Vomiting  . Escitalopram Oxalate Nausea And Vomiting  . Methimazole Other (See  Comments)  . Neomycin-Bacitracin Zn-Polymyx Other (See Comments)  . Neomycin-Bacitracin-Polymyxin  [Bacitracin-Neomycin-Polymyxin] Other (See Comments)  . Other Nausea And Vomiting, Nausea Only and Rash    All Mycins  . Pentazocine Nausea And Vomiting  . Pitavastatin Other (See Comments)    Headache.   . Salsalate Other (See Comments)  . Tetracyclines & Related Rash  . Azithromycin   . Crestor [Rosuvastatin Calcium] Nausea Only  . Elavil [Amitriptyline Hcl]   . Livalo [Pitavastatin Calcium] Other (See Comments)    Headache.   . Naproxen     nausea  . Neosporin [Neomycin-Polymyxin-Gramicidin]   . Prozac [Fluoxetine]   . Rosuvastatin    Medications: See med rec.  Review of Systems: No fevers, chills, night sweats, weight loss, chest pain, or shortness of breath.   Objective:    General:  Well Developed, well nourished, and in no acute distress.  Neuro: Alert and oriented x3, extra-ocular muscles intact, sensation grossly intact.  HEENT: Normocephalic, atraumatic, pupils equal round reactive to light, neck supple, no masses, no lymphadenopathy, thyroid nonpalpable.  Skin: Warm and dry, no rashes. Cardiac: Regular rate and rhythm, no murmurs rubs or gallops, no lower extremity edema.  Respiratory: Clear to auscultation bilaterally. Not using accessory muscles, speaking in full sentences. Back Exam:  Inspection: Unremarkable  Motion: Flexion 45 deg, Extension 45 deg, Side Bending to 45 deg bilaterally,  Rotation to 45 deg bilaterally  SLR laying: Negative  XSLR laying: Negative  Palpable tenderness: None. FABER: negative. Sensory change: Gross sensation intact to all lumbar and sacral dermatomes.  Reflexes: 2+ at both patellar tendons, 2+ at achilles tendons, Babinski's downgoing.  Strength at foot  Plantar-flexion: 5/5 Dorsi-flexion: 5/5 Eversion: 5/5 Inversion: 5/5  Leg strength  Quad: 5/5 Hamstring: 5/5 Hip flexor: 5/5 Hip abductors: 5/5  Gait unremarkable.  Impression and Recommendations:    Lumbar spinal stenosis Persistent pain, adding x-rays, MRI for epidural planning. Gabapentin. Return for MRI results. ___________________________________________ Gwen Her. Dianah Field, M.D., ABFM., CAQSM. Primary Care and Sports Medicine [facility] [place] [facility]  Adjunct Professor of [place] of [facility] of Medicine

## 2018-12-09 ENCOUNTER — Other Ambulatory Visit: Payer: Self-pay | Admitting: Osteopathic Medicine

## 2018-12-10 ENCOUNTER — Other Ambulatory Visit: Payer: Self-pay

## 2018-12-10 DIAGNOSIS — Z720 Tobacco use: Secondary | ICD-10-CM

## 2018-12-10 MED ORDER — VARENICLINE TARTRATE 1 MG PO TABS: 1.0000 mg | ORAL_TABLET | Freq: Two times a day (BID) | ORAL | 1 refills | Status: DC

## 2018-12-10 NOTE — Telephone Encounter (Signed)
Pt's daughter has been updated of med refills sent to local pharmacy. She also requested a refill for chantix (rx was printed & faxed to [phone]). No other inquiries during call.

## 2018-12-16 ENCOUNTER — Ambulatory Visit (INDEPENDENT_AMBULATORY_CARE_PROVIDER_SITE_OTHER): Payer: Medicare HMO

## 2018-12-16 DIAGNOSIS — M545 Low back pain: Secondary | ICD-10-CM | POA: Diagnosis not present

## 2018-12-16 DIAGNOSIS — M48062 Spinal stenosis, lumbar region with neurogenic claudication: Secondary | ICD-10-CM | POA: Diagnosis not present

## 2018-12-16 DIAGNOSIS — M4807 Spinal stenosis, lumbosacral region: Secondary | ICD-10-CM

## 2018-12-18 ENCOUNTER — Telehealth: Payer: Self-pay

## 2018-12-18 NOTE — Telephone Encounter (Signed)
For chart documentation - Wille Celeste called about Heather Perkins's prescription for Chantix. She states we have to call [facility] for a refill of Chantix.   [facility] number [phone] Patient ID # - [id number]  I called and they are sending out the refill for Chantix and it will be delivered to our office in 7-10 day.   Order # - [id number]

## 2018-12-18 NOTE — Telephone Encounter (Signed)
Noted, thanks for taking care of this 

## 2018-12-19 ENCOUNTER — Encounter: Payer: Self-pay | Admitting: Sports Medicine

## 2018-12-19 ENCOUNTER — Ambulatory Visit (INDEPENDENT_AMBULATORY_CARE_PROVIDER_SITE_OTHER): Payer: Medicare HMO | Admitting: Sports Medicine

## 2018-12-19 ENCOUNTER — Other Ambulatory Visit: Payer: Self-pay

## 2018-12-19 DIAGNOSIS — M48062 Spinal stenosis, lumbar region with neurogenic claudication: Secondary | ICD-10-CM | POA: Diagnosis not present

## 2018-12-19 MED ORDER — MELOXICAM 15 MG PO TABS: ORAL_TABLET | ORAL | 3 refills | Status: DC

## 2018-12-19 NOTE — Assessment & Plan Note (Signed)
Multilevel lumbar spinal stenosis with L3-L4. Continue gabapentin at 2 tabs daily, switching from ibuprofen to meloxicam. Return to see me as needed. We can certainly proceed with a right L3-L4 interlaminar epidural should symptoms return.

## 2018-12-19 NOTE — Progress Notes (Signed)
Subjective:    CC: Go over MRI results  HPI: Heather Perkins is a pleasant 85 year old female, we have been treating her for axial low back pain, spinal stenosis related.  She failed physical therapy, NSAIDs, ultimately we started gabapentin and she did improve significantly, currently taking gabapentin about twice a day.  Not really taking her ibuprofen consistently.  Bowel or bladder dysfunction, saddle numbness, constitutional symptoms.  I reviewed the past medical history, family history, social history, surgical history, and allergies today and no changes were needed.  Please see the problem list section below in epic for further details.  Past Medical History: Past Medical History:  Diagnosis Date  . Allergy   . Cancer ([place])    skin  . Colitis, ischemic ([place])   . Depression   . Fibromyalgia   . GERD (gastroesophageal reflux disease)   . Hyperthyroidism 2010   sees Dr Marcello Moores, did RAI  . Peripheral vascular disease ([place])   . Shingles    Past Surgical History: Past Surgical History:  Procedure Laterality Date  . ABDOMINAL HYSTERECTOMY  1975   for dub  . APPENDECTOMY  1939  . BREAST BIOPSY  1954  . [place]   right  . CATARACT EXTRACTION  2009   right and left.   . CERVICAL FUSION    . [place]  . ORIF ANKLE FRACTURE  1979   left  . TONSILLECTOMY     Social History: Social History   Socioeconomic History  . Marital status: Single    Spouse name: Not on file  . Number of children: 3  . Years of education: college  . Highest education level: 12th grade  Occupational History  . Occupation: retired    Comment: LPN  Social Needs  . Financial resource strain: Not hard at all  . Food insecurity:    Worry: Never true    Inability: Never true  . Transportation needs:    Medical: No    Non-medical: No  Tobacco Use  . Smoking status: Current Every Day Smoker    Packs/day: 1.00    Years: 50.00    Pack years: 50.00    Types:  Cigarettes  . Smokeless tobacco: Never Used  . Tobacco comment: pt states that she has been trying to quit but can't seem to quit  Substance and Sexual Activity  . Alcohol use: No    Frequency: Never  . Drug use: No  . Sexual activity: Not Currently  Lifestyle  . Physical activity:    Days per week: 0 days    Minutes per session: 0 min  . Stress: Not at all  Relationships  . Social connections:    Talks on phone: Once a week    Gets together: Never    Attends religious service: Never    Active member of club or organization: No    Attends meetings of clubs or organizations: Never    Relationship status: Not on file  Other Topics Concern  . Not on file  Social History Narrative  . Not on file   Family History: Family History  Problem Relation Age of Onset  . Hypertension Sister   . Cancer Daughter   . Hyperlipidemia Daughter    Allergies: Allergies  Allergen Reactions  . Amitriptyline Other (See Comments)  . Amoxicillin-Pot Clavulanate Nausea And Vomiting    Vomiting   . Bupropion Other (See Comments)    constipation   . Citalopram Hydrobromide Other (See Comments)  .  Codeine Nausea And Vomiting and Nausea Only  . Erythromycin Nausea And Vomiting  . Escitalopram Oxalate Nausea And Vomiting  . Methimazole Other (See Comments)  . Neomycin-Bacitracin Zn-Polymyx Other (See Comments)  . Neomycin-Bacitracin-Polymyxin  [Bacitracin-Neomycin-Polymyxin] Other (See Comments)  . Other Nausea And Vomiting, Nausea Only and Rash    All Mycins  . Pentazocine Nausea And Vomiting  . Pitavastatin Other (See Comments)    Headache.   . Salsalate Other (See Comments)  . Tetracyclines & Related Rash  . Azithromycin   . Crestor [Rosuvastatin Calcium] Nausea Only  . Elavil [Amitriptyline Hcl]   . Livalo [Pitavastatin Calcium] Other (See Comments)    Headache.   . Naproxen     nausea  . Neosporin [Neomycin-Polymyxin-Gramicidin]   . Prozac [Fluoxetine]   . Rosuvastatin     Medications: See med rec.  Review of Systems: No fevers, chills, night sweats, weight loss, chest pain, or shortness of breath.   Objective:    General: Well Developed, well nourished, and in no acute distress.  Neuro: Alert and oriented x3, extra-ocular muscles intact, sensation grossly intact.  HEENT: Normocephalic, atraumatic, pupils equal round reactive to light, neck supple, no masses, no lymphadenopathy, thyroid nonpalpable.  Skin: Warm and dry, no rashes. Cardiac: Regular rate and rhythm, no murmurs rubs or gallops, no lower extremity edema.  Respiratory: Clear to auscultation bilaterally. Not using accessory muscles, speaking in full sentences.  MRI personally reviewed, she has severe multilevel lumbar DDD with spinal stenosis that is multifactorial, worse at the L3-L4 level.  Impression and Recommendations:    Lumbar spinal stenosis Multilevel lumbar spinal stenosis with L3-L4. Continue gabapentin at 2 tabs daily, switching from ibuprofen to meloxicam. Return to see me as needed. We can certainly proceed with a right L3-L4 interlaminar epidural should symptoms return.   ___________________________________________ Gwen Her. Dianah Field, M.D., ABFM., CAQSM. Primary Care and Sports Medicine [place] [facility]  Adjunct Professor of [place] of [employer] of Medicine

## 2018-12-26 ENCOUNTER — Telehealth: Payer: Self-pay

## 2018-12-26 NOTE — Telephone Encounter (Signed)
Called pt - informed pt that samples of chantix arrived at our office. Aware that samples are available for pick up at front desk. Direct call back info provided.   Lot # [id number] [place] # [phone]

## 2019-01-10 ENCOUNTER — Other Ambulatory Visit: Payer: Self-pay | Admitting: Osteopathic Medicine

## 2019-01-10 DIAGNOSIS — E0590 Thyrotoxicosis, unspecified without thyrotoxic crisis or storm: Secondary | ICD-10-CM

## 2019-01-13 NOTE — Telephone Encounter (Signed)
[facility] pharmacy requesting med refill for propylthiouracil. It appears that thyroid check was completed on 02/20/18. Pls advise, thanks.

## 2019-01-20 ENCOUNTER — Other Ambulatory Visit: Payer: Self-pay | Admitting: Sports Medicine

## 2019-01-27 ENCOUNTER — Other Ambulatory Visit: Payer: Self-pay | Admitting: Osteopathic Medicine

## 2019-01-28 ENCOUNTER — Other Ambulatory Visit: Payer: Self-pay | Admitting: Osteopathic Medicine

## 2019-02-13 ENCOUNTER — Encounter: Payer: Self-pay | Admitting: Sports Medicine

## 2019-02-13 DIAGNOSIS — M48062 Spinal stenosis, lumbar region with neurogenic claudication: Secondary | ICD-10-CM

## 2019-02-14 ENCOUNTER — Other Ambulatory Visit: Payer: Self-pay

## 2019-02-14 NOTE — Telephone Encounter (Signed)
[facility] requests a refill on Alprazolam.

## 2019-02-17 MED ORDER — ALPRAZOLAM 0.5 MG PO TABS: 0.5000 mg | ORAL_TABLET | Freq: Two times a day (BID) | ORAL | 0 refills | Status: DC | PRN

## 2019-02-17 NOTE — Assessment & Plan Note (Signed)
Multilevel lumbar spinal stenosis worst at L3-L4. Continue gabapentin 2 tabs daily, we switched from ibuprofen to meloxicam. Proceeding with a right L3-L4 interlaminar epidural

## 2019-02-28 ENCOUNTER — Ambulatory Visit (INDEPENDENT_AMBULATORY_CARE_PROVIDER_SITE_OTHER): Payer: Medicare HMO | Admitting: Osteopathic Medicine

## 2019-02-28 ENCOUNTER — Encounter: Payer: Self-pay | Admitting: Osteopathic Medicine

## 2019-02-28 VITALS — Ht 62.0 in | Wt 129.0 lb

## 2019-02-28 DIAGNOSIS — R112 Nausea with vomiting, unspecified: Secondary | ICD-10-CM | POA: Diagnosis not present

## 2019-02-28 MED ORDER — ONDANSETRON HCL 4 MG PO TABS: 4.0000 mg | ORAL_TABLET | Freq: Three times a day (TID) | ORAL | 0 refills | Status: DC | PRN

## 2019-02-28 NOTE — Progress Notes (Signed)
Virtual Visit via Video (App used: Doximity) Note  I connected with      Heather Perkins on 02/28/19 at 11:10 by a telemedicine application and verified that I am speaking with the correct person using two identifiers.  Patient is at home in bed I am working from home    I discussed the limitations of evaluation and management by telemedicine and the availability of in person appointments. The patient expressed understanding and agreed to proceed.  History of Present Illness: Heather Perkins is a 85 y.o. female who would like to discuss  Chief Complaint  Patient presents with  . Emesis    x2 last night, x1 this morning, nausea every day, worse the last week, still eating Nausea has been going on for about a week total No fever  Feeling chills No abdominal pain  No hematemesis or coffee grounds emesis  No SOB, some coughing d/t allergies  No chest pain  . Diarrhea    Will have diarrhea / loose stool x4 days then 4-5 days of constipation This has been going on for months  Will take miralax when she is constipated  No bloody or black stool      Patient is hard of hearing, daughter [name] was present to assist with history.  Observations/Objective: Ht 5\' 2"  (1.575 m)   Wt 129 lb (58.5 kg)   BMI 23.59 kg/m  BP Readings from Last 3 Encounters:  12/19/18 108/65  11/28/18 (!) 147/79  11/20/18 (!) 155/80   Exam: Normal Speech.  NAD  Lab and Radiology Results No results found for this or any previous visit (from the past 72 hour(s)). No results found.     Assessment and Plan: 85 y.o. female with The encounter diagnosis was Non-intractable vomiting with nausea, unspecified vomiting type.  Patient reports compliance with social distancing in setting of pandemic.  Not sure if she may have eaten something that irritated her.  I advised patient and her daughter that my ability to diagnose/manage is limited, especially without vital signs at least.  Advised also that atypical  presentation of coronary/cardiac issues are possible especially in elderly women, patient denies chest pain, diaphoresis, shortness of breath, dizziness.  Sounds more GI in nature but patient and daughter are advised that if any worsening they are to proceed to the emergency department and I would have a low threshold for doing so  PDMP not reviewed this encounter. No orders of the defined types were placed in this encounter.  Meds ordered this encounter  Medications  . ondansetron (ZOFRAN) 4 MG tablet    Sig: Take 1-2 tablets (4-8 mg total) by mouth every 8 (eight) hours as needed for nausea or vomiting.    Dispense:  40 tablet    Refill:  0   Patient Instructions  Criss Rosales Diet A bland diet consists of foods that are often soft and do not have a lot of fat, fiber, or extra seasonings. Foods without fat, fiber, or seasoning are easier for the body to digest. They are also less likely to irritate your mouth, throat, stomach, and other parts of your digestive system. A bland diet is sometimes called a BRAT diet. What is my plan? Your health care provider or food and nutrition specialist (dietitian) may recommend specific changes to your diet to prevent symptoms or to treat your symptoms. These changes may include:  Eating small meals often.  Cooking food until it is soft enough to chew easily.  Chewing your  food well.  Drinking fluids slowly.  Not eating foods that are very spicy, sour, or fatty.  Not eating citrus fruits, such as oranges and grapefruit. What do I need to know about this diet?  Eat a variety of foods from the bland diet food list.  Do not follow a bland diet longer than needed.  Ask your health care provider whether you should take vitamins or supplements. What foods can I eat? Grains  Hot cereals, such as cream of wheat. Rice. Bread, crackers, or tortillas made from refined white flour. Vegetables Canned or cooked vegetables. Mashed or boiled potatoes. Fruits   Bananas. Applesauce. Other types of cooked or canned fruit with the skin and seeds removed, such as canned peaches or pears. Meats and other proteins  Scrambled eggs. Creamy peanut butter or other nut butters. Lean, well-cooked meats, such as chicken or fish. Tofu. Soups or broths. Dairy Low-fat dairy products, such as milk, cottage cheese, or yogurt. Beverages  Water. Herbal tea. Apple juice. Fats and oils Mild salad dressings. Canola or olive oil. Sweets and desserts Pudding. Custard. Fruit gelatin. Ice cream. The items listed above may not be a complete list of recommended foods and beverages. Contact a dietitian for more options. What foods are not recommended? Grains Whole grain breads and cereals. Vegetables Raw vegetables. Fruits Raw fruits, especially citrus, berries, or dried fruits. Dairy Whole fat dairy foods. Beverages Caffeinated drinks. Alcohol. Seasonings and condiments Strongly flavored seasonings or condiments. Hot sauce. Salsa. Other foods Spicy foods. Fried foods. Sour foods, such as pickled or fermented foods. Foods with high sugar content. Foods high in fiber. The items listed above may not be a complete list of foods and beverages to avoid. Contact a dietitian for more information. Summary  A bland diet consists of foods that are often soft and do not have a lot of fat, fiber, or extra seasonings.  Foods without fat, fiber, or seasoning are easier for the body to digest.  Check with your health care provider to see how long you should follow this diet plan. It is not meant to be followed for long periods. This information is not intended to replace advice given to you by your health care provider. Make sure you discuss any questions you have with your health care provider. Document Released: 01/17/2016 Document Revised: 10/24/2017 Document Reviewed: 10/24/2017 Elsevier Interactive Patient Education  2019 [employer].    Instructions sent via  [place]. If MyChart not available, pt was given option for info via personal e-mail w/ no guarantee of protected health info over unsecured e-mail communication, and MyChart sign-up instructions were included.   Follow Up Instructions: Return if symptoms worsen or fail to improve, go to ER! .    I discussed the assessment and treatment plan with the patient. The patient was provided an opportunity to ask questions and all were answered. The patient agreed with the plan and demonstrated an understanding of the instructions.   The patient was advised to call back or seek an in-person evaluation if any new concerns, if symptoms worsen or if the condition fails to improve as anticipated.  25 minutes of non-face-to-face time was provided during this encounter.                      Historical information moved to improve visibility of documentation.  Past Medical History:  Diagnosis Date  . Allergy   . Cancer ([place])    skin  . Colitis, ischemic ([place])   .  Depression   . Fibromyalgia   . GERD (gastroesophageal reflux disease)   . Hyperthyroidism 2010   sees Dr Marcello Moores, did RAI  . Peripheral vascular disease ([place])   . Shingles    Past Surgical History:  Procedure Laterality Date  . ABDOMINAL HYSTERECTOMY  1975   for dub  . APPENDECTOMY  1939  . BREAST BIOPSY  1954  . [place]   right  . CATARACT EXTRACTION  2009   right and left.   . CERVICAL FUSION    . [place]  . ORIF ANKLE FRACTURE  1979   left  . TONSILLECTOMY     Social History   Tobacco Use  . Smoking status: Current Every Day Smoker    Packs/day: 1.00    Years: 50.00    Pack years: 50.00    Types: Cigarettes  . Smokeless tobacco: Never Used  . Tobacco comment: pt states that she has been trying to quit but can't seem to quit  Substance Use Topics  . Alcohol use: No    Frequency: Never   family history includes Cancer in her daughter; Hyperlipidemia in her  daughter; Hypertension in her sister.  Medications: Current Outpatient Medications  Medication Sig Dispense Refill  . acetaminophen (TYLENOL) 650 MG CR tablet Take 1 tablet (650 mg total) by mouth every 8 (eight) hours as needed for pain. 90 tablet 3  . albuterol (PROVENTIL HFA;VENTOLIN HFA) 108 (90 Base) MCG/ACT inhaler Inhale 2 puffs into the lungs every 6 (six) hours as needed for wheezing or shortness of breath. 1 Inhaler 3  . ALPRAZolam (XANAX) 0.5 MG tablet Take 1-2 tablets (0.5-1 mg total) by mouth 2 (two) times daily as needed for anxiety. 30 tablet 0  . aspirin EC 81 MG tablet Take 1 tablet (81 mg total) by mouth daily. 90 tablet 3  . B Complex Vitamins (B COMPLEX PO) Take by mouth.    . Calcium-Magnesium-Zinc 500-250-12.5 MG TABS Take by mouth daily. TAKE 1 CAPSULE BY MOUTH 2 TIMES DAILY    . cilostazol (PLETAL) 100 MG tablet Take 1 tablet (100 mg total) by mouth 2 (two) times daily. 180 tablet 2  . fish oil-omega-3 fatty acids 1000 MG capsule Take 2 g by mouth daily.    . fluticasone (FLONASE) 50 MCG/ACT nasal spray PLACE 2 SPRAYS INTO BOTH NOSTRILS DAILY 16 g 11  . gabapentin (NEURONTIN) 300 MG capsule One tab PO qHS for a week, then BID for a week, then TID. May double weekly to a max of 3,600mg /day (Patient taking differently: Take 300 mg by mouth 2 (two) times daily. ) 90 capsule 3  . magnesium oxide (MAG-OX) 400 MG tablet Take 2 tablets (800 mg total) by mouth at bedtime. 180 tablet 3  . meloxicam (MOBIC) 15 MG tablet One tab PO qAM with breakfast for 2 weeks, then daily prn pain. (Patient taking differently: Take 15 mg by mouth 2 (two) times a day. ) 30 tablet 3  . omeprazole (PRILOSEC) 20 MG capsule TAKE 1 CAPSULE(20 MG) BY MOUTH TWICE DAILY BEFORE A MEAL 180 capsule 3  . ondansetron (ZOFRAN) 4 MG tablet Take 1-2 tablets (4-8 mg total) by mouth every 8 (eight) hours as needed for nausea or vomiting. 40 tablet 0  . pravastatin (PRAVACHOL) 40 MG tablet TAKE 1 TABLET(40 MG) BY MOUTH  DAILY 90 tablet 0  . promethazine (PHENERGAN) 25 MG tablet Take by mouth.    . propylthiouracil (PTU) 50 MG tablet  TAKE 1 TABLET(50 MG) BY MOUTH TWICE DAILY 180 tablet 0  . SPIRIVA RESPIMAT 2.5 MCG/ACT AERS 2 INHALATIONS DAILY TO HELP REDUCE COUGH 4 g 11  . varenicline (CHANTIX CONTINUING MONTH PAK) 1 MG tablet Take 1 tablet (1 mg total) by mouth 2 (two) times daily. 180 tablet 1  . cyclobenzaprine (FLEXERIL) 10 MG tablet TAKE ONE TABLET BY MOUTH 3 TIMES DAILY AS NEEDED FOR MUSCLE SPASMS (Patient not taking: Reported on 02/28/2019) 90 tablet 2  . HYDROcodone-acetaminophen (NORCO) 10-325 MG tablet Take 0.5-1 tablets by mouth every 8 (eight) hours as needed. (Patient not taking: Reported on 02/28/2019) 90 tablet 0  . polyethylene glycol (MIRALAX / GLYCOLAX) packet Take 17 g by mouth daily.       No current facility-administered medications for this visit.    Allergies  Allergen Reactions  . Amitriptyline Other (See Comments)  . Amoxicillin-Pot Clavulanate Nausea And Vomiting    Vomiting   . Bupropion Other (See Comments)    constipation   . Citalopram Hydrobromide Other (See Comments)  . Codeine Nausea And Vomiting and Nausea Only  . Erythromycin Nausea And Vomiting  . Escitalopram Oxalate Nausea And Vomiting  . Methimazole Other (See Comments)  . Neomycin-Bacitracin Zn-Polymyx Other (See Comments)  . Neomycin-Bacitracin-Polymyxin  [Bacitracin-Neomycin-Polymyxin] Other (See Comments)  . Other Nausea And Vomiting, Nausea Only and Rash    All Mycins  . Pentazocine Nausea And Vomiting  . Pitavastatin Other (See Comments)    Headache.   . Salsalate Other (See Comments)  . Tetracyclines & Related Rash  . Azithromycin   . Crestor [Rosuvastatin Calcium] Nausea Only  . Elavil [Amitriptyline Hcl]   . Livalo [Pitavastatin Calcium] Other (See Comments)    Headache.   . Naproxen     nausea  . Neosporin [Neomycin-Polymyxin-Gramicidin]   . Prozac [Fluoxetine]   . Rosuvastatin     PDMP not  reviewed this encounter. No orders of the defined types were placed in this encounter.  Meds ordered this encounter  Medications  . ondansetron (ZOFRAN) 4 MG tablet    Sig: Take 1-2 tablets (4-8 mg total) by mouth every 8 (eight) hours as needed for nausea or vomiting.    Dispense:  40 tablet    Refill:  0

## 2019-02-28 NOTE — Patient Instructions (Signed)
Bland Diet  A bland diet consists of foods that are often soft and do not have a lot of fat, fiber, or extra seasonings. Foods without fat, fiber, or seasoning are easier for the body to digest. They are also less likely to irritate your mouth, throat, stomach, and other parts of your digestive system. A bland diet is sometimes called a BRAT diet.  What is my plan?  Your health care provider or food and nutrition specialist (dietitian) may recommend specific changes to your diet to prevent symptoms or to treat your symptoms. These changes may include:   Eating small meals often.   Cooking food until it is soft enough to chew easily.   Chewing your food well.   Drinking fluids slowly.   Not eating foods that are very spicy, sour, or fatty.   Not eating citrus fruits, such as oranges and grapefruit.  What do I need to know about this diet?   Eat a variety of foods from the bland diet food list.   Do not follow a bland diet longer than needed.   Ask your health care provider whether you should take vitamins or supplements.  What foods can I eat?  Grains    Hot cereals, such as cream of wheat. Rice. Bread, crackers, or tortillas made from refined white flour.  Vegetables  Canned or cooked vegetables. Mashed or boiled potatoes.  Fruits    Bananas. Applesauce. Other types of cooked or canned fruit with the skin and seeds removed, such as canned peaches or pears.  Meats and other proteins    Scrambled eggs. Creamy peanut butter or other nut butters. Lean, well-cooked meats, such as chicken or fish. Tofu. Soups or broths.  Dairy  Low-fat dairy products, such as milk, cottage cheese, or yogurt.  Beverages    Water. Herbal tea. Apple juice.  Fats and oils  Mild salad dressings. Canola or olive oil.  Sweets and desserts  Pudding. Custard. Fruit gelatin. Ice cream.  The items listed above may not be a complete list of recommended foods and beverages. Contact a dietitian for more options.  What foods are not  recommended?  Grains  Whole grain breads and cereals.  Vegetables  Raw vegetables.  Fruits  Raw fruits, especially citrus, berries, or dried fruits.  Dairy  Whole fat dairy foods.  Beverages  Caffeinated drinks. Alcohol.  Seasonings and condiments  Strongly flavored seasonings or condiments. Hot sauce. Salsa.  Other foods  Spicy foods. Fried foods. Sour foods, such as pickled or fermented foods. Foods with high sugar content. Foods high in fiber.  The items listed above may not be a complete list of foods and beverages to avoid. Contact a dietitian for more information.  Summary   A bland diet consists of foods that are often soft and do not have a lot of fat, fiber, or extra seasonings.   Foods without fat, fiber, or seasoning are easier for the body to digest.   Check with your health care provider to see how long you should follow this diet plan. It is not meant to be followed for long periods.  This information is not intended to replace advice given to you by your health care provider. Make sure you discuss any questions you have with your health care provider.  Document Released: 01/17/2016 Document Revised: 10/24/2017 Document Reviewed: 10/24/2017  Elsevier Interactive Patient Education  2019 Elsevier Inc.

## 2019-03-04 ENCOUNTER — Ambulatory Visit: Payer: Medicare HMO | Admitting: Osteopathic Medicine

## 2019-03-11 ENCOUNTER — Other Ambulatory Visit: Payer: Self-pay | Admitting: Osteopathic Medicine

## 2019-03-11 NOTE — Telephone Encounter (Signed)
Pt has an appt w/you on 6.23.2020.Heather Perkins, [place]

## 2019-03-14 ENCOUNTER — Encounter: Payer: Self-pay | Admitting: Sports Medicine

## 2019-03-14 ENCOUNTER — Ambulatory Visit (INDEPENDENT_AMBULATORY_CARE_PROVIDER_SITE_OTHER): Payer: Medicare HMO | Admitting: Sports Medicine

## 2019-03-14 ENCOUNTER — Telehealth: Payer: Self-pay | Admitting: Osteopathic Medicine

## 2019-03-14 ENCOUNTER — Other Ambulatory Visit: Payer: Self-pay

## 2019-03-14 DIAGNOSIS — R059 Cough, unspecified: Secondary | ICD-10-CM | POA: Insufficient documentation

## 2019-03-14 DIAGNOSIS — E860 Dehydration: Secondary | ICD-10-CM | POA: Insufficient documentation

## 2019-03-14 DIAGNOSIS — R05 Cough: Secondary | ICD-10-CM

## 2019-03-14 MED ORDER — CEFUROXIME AXETIL 500 MG PO TABS: 500.0000 mg | ORAL_TABLET | Freq: Two times a day (BID) | ORAL | 0 refills | Status: DC

## 2019-03-14 NOTE — Assessment & Plan Note (Addendum)
Nonspecific coughing, vomiting, weakness. Blood pressure low for her. 1 L of normal saline infused, adding Phenergan. I am going to draw her labs myself, CBC, CMP, amylase, lipase, urinalysis. I also did a COVID swab today with appropriate PPE. Return to see Dr. Sheppard Coil early next week.  Unable to provide any urine, we are going to add Ceftin empirically.

## 2019-03-14 NOTE — Telephone Encounter (Signed)
Ms. Marton daughter, [name] called. She coughs and then throws up, no nausea. Medication not working. She was seen the last time for the same issue. Please advise

## 2019-03-14 NOTE — Telephone Encounter (Signed)
I do not think another virtual visit is really going to change much and she should probably be seen in the office or urgent care if none of our providers have an opening.

## 2019-03-14 NOTE — Telephone Encounter (Signed)
Okay. Patient is scheduled for today with Heather Perkins

## 2019-03-14 NOTE — Progress Notes (Signed)
Subjective:    CC: Feeling sick  HPI: For the past few days this 85 year old female has had a sensation of nausea, occasional vomiting, coughing, chills, no recorded fevers, mild fatigue.  No shortness of breath, no chest pain, no skin rash, mild abdominal pain, no dysuria, no diarrhea, constipation.  Symptoms are moderate, persistent.  I reviewed the past medical history, family history, social history, surgical history, and allergies today and no changes were needed.  Please see the problem list section below in epic for further details.  Past Medical History: Past Medical History:  Diagnosis Date  . Allergy   . Cancer ([place])    skin  . Colitis, ischemic ([place])   . Depression   . Fibromyalgia   . GERD (gastroesophageal reflux disease)   . Hyperthyroidism 2010   sees Dr Marcello Moores, did RAI  . Peripheral vascular disease ([place])   . Shingles    Past Surgical History: Past Surgical History:  Procedure Laterality Date  . ABDOMINAL HYSTERECTOMY  1975   for dub  . APPENDECTOMY  1939  . BREAST BIOPSY  1954  . [place]   right  . CATARACT EXTRACTION  2009   right and left.   . CERVICAL FUSION    . [place]  . ORIF ANKLE FRACTURE  1979   left  . TONSILLECTOMY     Social History: Social History   Socioeconomic History  . Marital status: Single    Spouse name: Not on file  . Number of children: 3  . Years of education: college  . Highest education level: 12th grade  Occupational History  . Occupation: retired    Comment: LPN  Social Needs  . Financial resource strain: Not hard at all  . Food insecurity:    Worry: Never true    Inability: Never true  . Transportation needs:    Medical: No    Non-medical: No  Tobacco Use  . Smoking status: Current Every Day Smoker    Packs/day: 1.00    Years: 50.00    Pack years: 50.00    Types: Cigarettes  . Smokeless tobacco: Never Used  . Tobacco comment: pt states that she has been trying to  quit but can't seem to quit  Substance and Sexual Activity  . Alcohol use: No    Frequency: Never  . Drug use: No  . Sexual activity: Not Currently  Lifestyle  . Physical activity:    Days per week: 0 days    Minutes per session: 0 min  . Stress: Not at all  Relationships  . Social connections:    Talks on phone: Once a week    Gets together: Never    Attends religious service: Never    Active member of club or organization: No    Attends meetings of clubs or organizations: Never    Relationship status: Not on file  Other Topics Concern  . Not on file  Social History Narrative  . Not on file   Family History: Family History  Problem Relation Age of Onset  . Hypertension Sister   . Cancer Daughter   . Hyperlipidemia Daughter    Allergies: Allergies  Allergen Reactions  . Amitriptyline Other (See Comments)  . Amoxicillin-Pot Clavulanate Nausea And Vomiting    Vomiting   . Bupropion Other (See Comments)    constipation   . Citalopram Hydrobromide Other (See Comments)  . Codeine Nausea And Vomiting and Nausea Only  . Erythromycin  Nausea And Vomiting  . Escitalopram Oxalate Nausea And Vomiting  . Methimazole Other (See Comments)  . Neomycin-Bacitracin Zn-Polymyx Other (See Comments)  . Neomycin-Bacitracin-Polymyxin  [Bacitracin-Neomycin-Polymyxin] Other (See Comments)  . Other Nausea And Vomiting, Nausea Only and Rash    All Mycins  . Pentazocine Nausea And Vomiting  . Pitavastatin Other (See Comments)    Headache.   . Salsalate Other (See Comments)  . Tetracyclines & Related Rash  . Azithromycin   . Crestor [Rosuvastatin Calcium] Nausea Only  . Elavil [Amitriptyline Hcl]   . Livalo [Pitavastatin Calcium] Other (See Comments)    Headache.   . Naproxen     nausea  . Neosporin [Neomycin-Polymyxin-Gramicidin]   . Prozac [Fluoxetine]   . Rosuvastatin    Medications: See med rec.  Review of Systems: No fevers, chills, night sweats, weight loss, chest pain, or  shortness of breath.   Objective:    General: Well Developed, well nourished, and in no acute distress.  Neuro: Alert and oriented x3, extra-ocular muscles intact, sensation grossly intact.  HEENT: Normocephalic, atraumatic, pupils equal round reactive to light, neck supple, no masses, no lymphadenopathy, thyroid nonpalpable.  Skin: Warm and dry, no rashes. Cardiac: Regular rate and rhythm, no murmurs rubs or gallops, no lower extremity edema.  Respiratory: Clear to auscultation bilaterally. Not using accessory muscles, speaking in full sentences. Abdomen: Soft, nontender, nondistended, no bowel sounds, no palpable masses, no guarding, rigidity, rebound tenderness.  20-gauge angiocatheter in the right dorsal plexus of the hand, I then infused 1 L of normal saline.  I then used a butterfly to draw blood from the left dorsal venous plexus of the hand.  Impression and Recommendations:    Dehydration Nonspecific coughing, vomiting, weakness. Blood pressure low for her. 1 L of normal saline infused, adding Phenergan. I am going to draw her labs myself, CBC, CMP, amylase, lipase, urinalysis. I also did a COVID swab today with appropriate PPE. Return to see Dr. Sheppard Coil early next week.  Unable to provide any urine, we are going to add Ceftin empirically.  Coughing COVID swab obtained with appropriate PPE  I spent 40 minutes with this patient, greater than 50% was face-to-face time counseling regarding the above diagnoses  ___________________________________________ Gwen Her. Dianah Field, M.D., ABFM., CAQSM. Primary Care and Sports Medicine [facility]  Adjunct Professor of [place] [employer]

## 2019-03-14 NOTE — Assessment & Plan Note (Addendum)
COVID swab obtained with appropriate PPE

## 2019-03-14 NOTE — Telephone Encounter (Signed)
I have rescheduled patient to see Dr. Darene Lamer per Evlyn Clines.

## 2019-03-15 LAB — CBC WITH DIFFERENTIAL/PLATELET
Absolute Monocytes: 950 cells/uL (ref 200–950)
Basophils Absolute: 63 cells/uL (ref 0–200)
Basophils Relative: 0.3 %
Eosinophils Absolute: 0 cells/uL — ABNORMAL LOW (ref 15–500)
Eosinophils Relative: 0 %
HCT: 38.4 % (ref 35.0–45.0)
Hemoglobin: 13.2 g/dL (ref 11.7–15.5)
Lymphs Abs: 1540 cells/uL (ref 850–3900)
MCH: 31.1 pg (ref 27.0–33.0)
MCHC: 34.4 g/dL (ref 32.0–36.0)
MCV: 90.6 fL (ref 80.0–100.0)
MPV: 11.3 fL (ref 7.5–12.5)
Monocytes Relative: 4.5 %
Neutro Abs: 18547 cells/uL — ABNORMAL HIGH (ref 1500–7800)
Neutrophils Relative %: 87.9 %
Platelets: 291 10*3/uL (ref 140–400)
RBC: 4.24 10*6/uL (ref 3.80–5.10)
RDW: 13.1 % (ref 11.0–15.0)
Total Lymphocyte: 7.3 %
WBC: 21.1 10*3/uL — ABNORMAL HIGH (ref 3.8–10.8)

## 2019-03-15 LAB — COMPLETE METABOLIC PANEL WITHOUT GFR
AG Ratio: 1.8 (calc) (ref 1.0–2.5)
ALT: 8 U/L (ref 6–29)
AST: 13 U/L (ref 10–35)
Albumin: 3.9 g/dL (ref 3.6–5.1)
Alkaline phosphatase (APISO): 56 U/L (ref 37–153)
BUN/Creatinine Ratio: 16 (calc) (ref 6–22)
BUN: 17 mg/dL (ref 7–25)
CO2: 20 mmol/L (ref 20–32)
Calcium: 8.9 mg/dL (ref 8.6–10.4)
Chloride: 106 mmol/L (ref 98–110)
Creat: 1.06 mg/dL — ABNORMAL HIGH (ref 0.60–0.88)
GFR, Est African American: 56 mL/min/{1.73_m2} — ABNORMAL LOW
GFR, Est Non African American: 49 mL/min/{1.73_m2} — ABNORMAL LOW
Globulin: 2.2 g/dL (ref 1.9–3.7)
Glucose, Bld: 100 mg/dL — ABNORMAL HIGH (ref 65–99)
Potassium: 4.1 mmol/L (ref 3.5–5.3)
Sodium: 137 mmol/L (ref 135–146)
Total Bilirubin: 0.9 mg/dL (ref 0.2–1.2)
Total Protein: 6.1 g/dL (ref 6.1–8.1)

## 2019-03-15 LAB — AMYLASE: Amylase: 23 U/L (ref 21–101)

## 2019-03-15 LAB — LIPASE: Lipase: <5 U/L — ABNORMAL LOW (ref 7–60)

## 2019-03-16 LAB — SARS-COV-2 RNA,(COVID-19) QUALITATIVE NAAT: SARS CoV2 RNA: NOT DETECTED

## 2019-03-20 ENCOUNTER — Encounter: Payer: Self-pay | Admitting: Osteopathic Medicine

## 2019-03-20 ENCOUNTER — Ambulatory Visit (INDEPENDENT_AMBULATORY_CARE_PROVIDER_SITE_OTHER): Payer: Medicare HMO | Admitting: Osteopathic Medicine

## 2019-03-20 VITALS — BP 115/61 | HR 84 | Wt 133.0 lb

## 2019-03-20 DIAGNOSIS — Z5189 Encounter for other specified aftercare: Secondary | ICD-10-CM | POA: Diagnosis not present

## 2019-03-20 DIAGNOSIS — M48062 Spinal stenosis, lumbar region with neurogenic claudication: Secondary | ICD-10-CM

## 2019-03-20 DIAGNOSIS — M5030 Other cervical disc degeneration, unspecified cervical region: Secondary | ICD-10-CM | POA: Diagnosis not present

## 2019-03-20 MED ORDER — HYDROCODONE-ACETAMINOPHEN 10-325 MG PO TABS: 0.5000 | ORAL_TABLET | Freq: Three times a day (TID) | ORAL | 0 refills | Status: DC | PRN

## 2019-03-20 MED ORDER — MELOXICAM 15 MG PO TABS: 15.0000 mg | ORAL_TABLET | Freq: Every day | ORAL | 1 refills | Status: DC | PRN

## 2019-03-20 NOTE — Progress Notes (Signed)
HPI: Heather Perkins is a 85 y.o. female who  has a past medical history of Allergy, Cancer ([place]), Colitis, ischemic ([place]), Depression, Fibromyalgia, GERD (gastroesophageal reflux disease), Hyperthyroidism (2010), Peripheral vascular disease ([place]), and Shingles.  she presents to [facility] w/ chief complaint of:  Follow-up - recent illness  Patient reports still fatigue but much better than when she saw 1 of my colleagues a week ago when I was out of the office.  At that point, received IV fluids.  Concern for viral illness.  Patient states that diarrhea has resolved, her appetite is better.  Daughter would agree that patient is recovering okay though not quite back to baseline.     At today's visit 03/21/19 ... PMH, PSH, FH reviewed and updated as needed.  Current medication list and allergy/intolerance hx reviewed and updated as needed. (See remainder of HPI, ROS, Phys Exam below)         ASSESSMENT/PLAN: The primary encounter diagnosis was Convalescence. Diagnoses of Spinal stenosis of lumbar region with neurogenic claudication and DDD (degenerative disc disease), cervical were also pertinent to this visit.  Requests refill of pain medications    Meds ordered this encounter  Medications  . meloxicam (MOBIC) 15 MG tablet    Sig: Take 1 tablet (15 mg total) by mouth daily as needed for pain.    Dispense:  90 tablet    Refill:  1  . HYDROcodone-acetaminophen (NORCO) 10-325 MG tablet    Sig: Take 0.5-1 tablets by mouth every 8 (eight) hours as needed.    Dispense:  90 tablet    Refill:  0    There are no Patient Instructions on file for this visit.    Follow-up plan: Return in about 3 months (around 06/20/2019) for pain medication management - sooner if needed. CANCEL 04/04/2019 appt  .                                                 ################################################# ################################################# ################################################# #################################################    Current Meds  Medication Sig  . acetaminophen (TYLENOL) 650 MG CR tablet Take 1 tablet (650 mg total) by mouth every 8 (eight) hours as needed for pain.  Marland Kitchen albuterol (PROVENTIL HFA;VENTOLIN HFA) 108 (90 Base) MCG/ACT inhaler Inhale 2 puffs into the lungs every 6 (six) hours as needed for wheezing or shortness of breath.  . ALPRAZolam (XANAX) 0.5 MG tablet Take 0.5-1 tablets (0.25-0.5 mg total) by mouth 2 (two) times daily as needed for up to 30 days for anxiety (#30 for 30 or more days, use sparingly to prevent dependence).  Marland Kitchen aspirin EC 81 MG tablet Take 1 tablet (81 mg total) by mouth daily.  . B Complex Vitamins (B COMPLEX PO) Take by mouth.  . Calcium-Magnesium-Zinc 500-250-12.5 MG TABS Take by mouth daily. TAKE 1 CAPSULE BY MOUTH 2 TIMES DAILY  . cefUROXime (CEFTIN) 500 MG tablet Take 1 tablet (500 mg total) by mouth 2 (two) times daily with a meal.  . cilostazol (PLETAL) 100 MG tablet Take 1 tablet (100 mg total) by mouth 2 (two) times daily.  . cyclobenzaprine (FLEXERIL) 10 MG tablet TAKE ONE TABLET BY MOUTH 3 TIMES DAILY AS NEEDED FOR MUSCLE SPASMS  . fish oil-omega-3 fatty acids 1000 MG capsule Take 2 g by mouth daily.  . fluticasone (FLONASE) 50 MCG/ACT nasal spray  PLACE 2 SPRAYS INTO BOTH NOSTRILS DAILY  . gabapentin (NEURONTIN) 300 MG capsule One tab PO qHS for a week, then BID for a week, then TID. May double weekly to a max of 3,600mg /day (Patient taking differently: Take 300 mg by mouth 2 (two) times daily. )  . HYDROcodone-acetaminophen (NORCO) 10-325 MG tablet Take 0.5-1 tablets by mouth every 8 (eight) hours as needed.  . magnesium oxide (MAG-OX) 400 MG tablet Take 2 tablets (800  mg total) by mouth at bedtime.  . meloxicam (MOBIC) 15 MG tablet Take 1 tablet (15 mg total) by mouth daily as needed for pain.  Marland Kitchen omeprazole (PRILOSEC) 20 MG capsule TAKE 1 CAPSULE(20 MG) BY MOUTH TWICE DAILY BEFORE A MEAL  . ondansetron (ZOFRAN) 4 MG tablet Take 1-2 tablets (4-8 mg total) by mouth every 8 (eight) hours as needed for nausea or vomiting.  . polyethylene glycol (MIRALAX / GLYCOLAX) packet Take 17 g by mouth daily.    . pravastatin (PRAVACHOL) 40 MG tablet TAKE 1 TABLET(40 MG) BY MOUTH DAILY  . promethazine (PHENERGAN) 25 MG tablet Take by mouth.  . propylthiouracil (PTU) 50 MG tablet TAKE 1 TABLET(50 MG) BY MOUTH TWICE DAILY  . SPIRIVA RESPIMAT 2.5 MCG/ACT AERS 2 INHALATIONS DAILY TO HELP REDUCE COUGH  . varenicline (CHANTIX CONTINUING MONTH PAK) 1 MG tablet Take 1 tablet (1 mg total) by mouth 2 (two) times daily.  . [DISCONTINUED] HYDROcodone-acetaminophen (NORCO) 10-325 MG tablet Take 0.5-1 tablets by mouth every 8 (eight) hours as needed.  . [DISCONTINUED] meloxicam (MOBIC) 15 MG tablet One tab PO qAM with breakfast for 2 weeks, then daily prn pain. (Patient taking differently: Take 15 mg by mouth 2 (two) times a day. )    Allergies  Allergen Reactions  . Amitriptyline Other (See Comments)  . Amoxicillin-Pot Clavulanate Nausea And Vomiting    Vomiting   . Bupropion Other (See Comments)    constipation   . Citalopram Hydrobromide Other (See Comments)  . Codeine Nausea And Vomiting and Nausea Only  . Erythromycin Nausea And Vomiting  . Escitalopram Oxalate Nausea And Vomiting  . Methimazole Other (See Comments)  . Neomycin-Bacitracin Zn-Polymyx Other (See Comments)  . Neomycin-Bacitracin-Polymyxin  [Bacitracin-Neomycin-Polymyxin] Other (See Comments)  . Other Nausea And Vomiting, Nausea Only and Rash    All Mycins  . Pentazocine Nausea And Vomiting  . Pitavastatin Other (See Comments)    Headache.   . Salsalate Other (See Comments)  . Tetracyclines & Related Rash   . Azithromycin   . Crestor [Rosuvastatin Calcium] Nausea Only  . Elavil [Amitriptyline Hcl]   . Livalo [Pitavastatin Calcium] Other (See Comments)    Headache.   . Naproxen     nausea  . Neosporin [Neomycin-Polymyxin-Gramicidin]   . Prozac [Fluoxetine]   . Rosuvastatin        Review of Systems:  Constitutional: +recent illness  HEENT: No  headache, no vision change  Cardiac: No  chest pain, No  pressure, No palpitations  Respiratory:  No  shortness of breath. No  Cough  Gastrointestinal: No  abdominal pain, no change on bowel habits  Neurologic: No  weakness, No  Dizziness   Exam:  BP 115/61   Pulse 84   Wt 133 lb (60.3 kg)   SpO2 95%   BMI 24.33 kg/m   Constitutional: VS see above. General Appearance: alert, well-developed, well-nourished, NAD  Eyes: Normal lids and conjunctive, non-icteric sclera  Ears, Nose, Mouth, Throat: MMM, Normal external inspection ears/nares/mouth/lips/gums.  Neck: No masses, trachea  midline.   Respiratory: Normal respiratory effort. no wheeze, no rhonchi, no rales  Cardiovascular: S1/S2 normal,no rub/gallop auscultated. RRR.   Musculoskeletal: Gait normal. Symmetric and independent movement of all extremities  Neurological: Normal balance/coordination. No tremor.  Skin: warm, dry, intact.   Psychiatric: Normal judgment/insight. Normal mood and affect. Oriented x3.       Visit summary with medication list and pertinent instructions was printed for patient to review, patient was advised to alert [place] if any updates are needed. All questions at time of visit were answered - patient instructed to contact office with any additional concerns. ER/RTC precautions were reviewed with the patient and understanding verbalized.     Please note: voice recognition software was used to produce this document, and typos may escape review. Please contact Dr. Sheppard Coil for any needed clarifications.    Follow up plan: Return in about 3  months (around 06/20/2019) for pain medication management - sooner if needed. CANCEL 04/04/2019 appt .

## 2019-04-01 ENCOUNTER — Ambulatory Visit: Payer: Medicare HMO | Admitting: Osteopathic Medicine

## 2019-04-01 IMAGING — DX DG HIP (WITH OR WITHOUT PELVIS) 2-3V*R*
3 series · 3 of 3 positions shown · non-contrast
Comparison: 02/23/2015 radiographs

CLINICAL DATA: Acute RIGHT hip pain for 1 week. No known injury..
Initial encounter.

EXAM:
DG HIP (WITH OR WITHOUT PELVIS) 2-3V RIGHT

[pelvis ap]
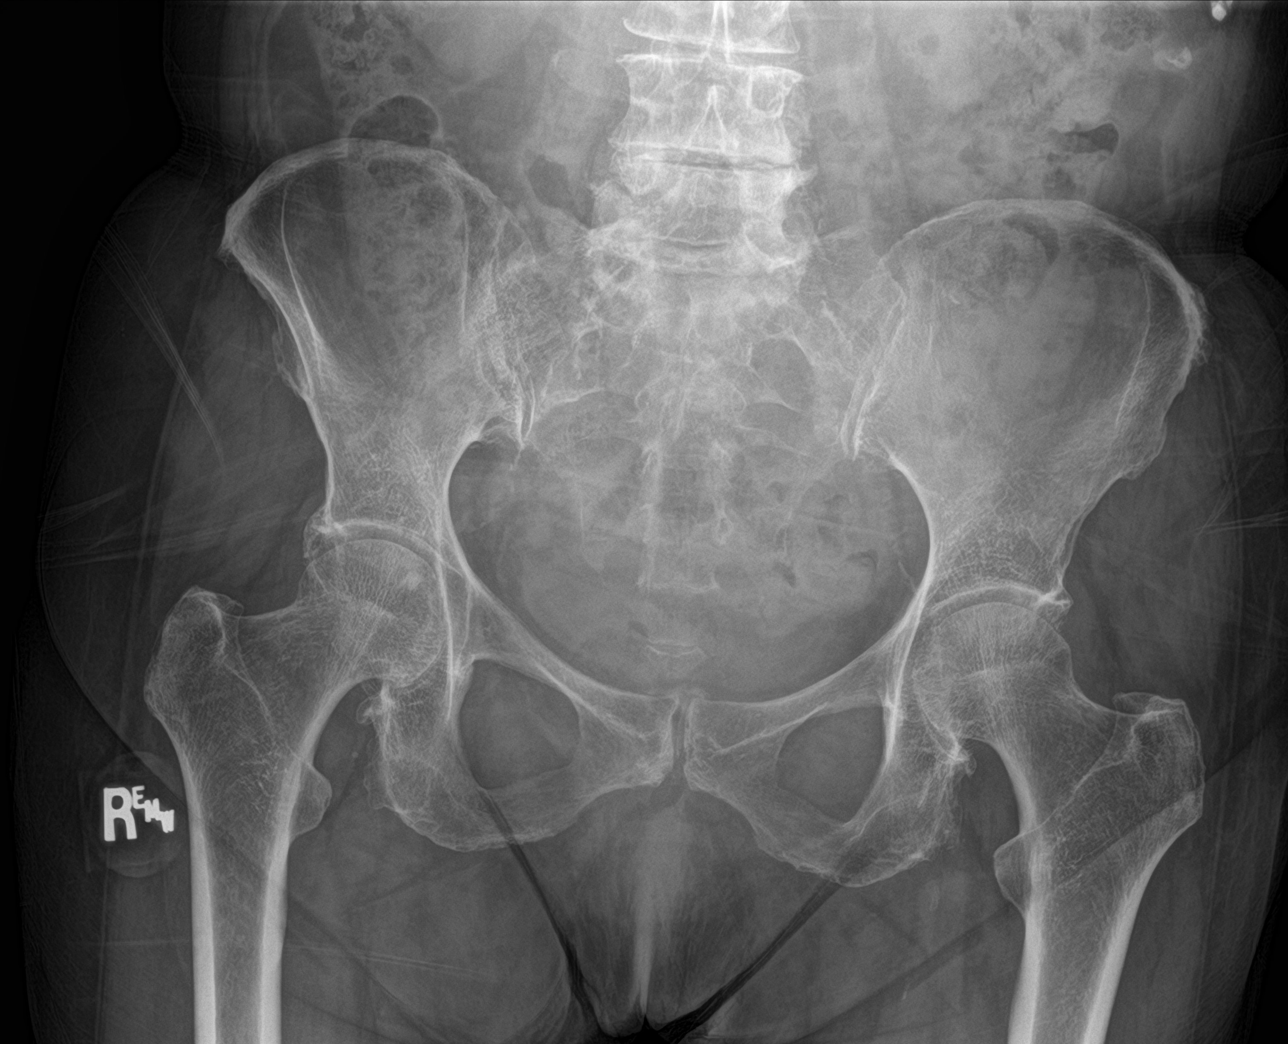

[hip lat]
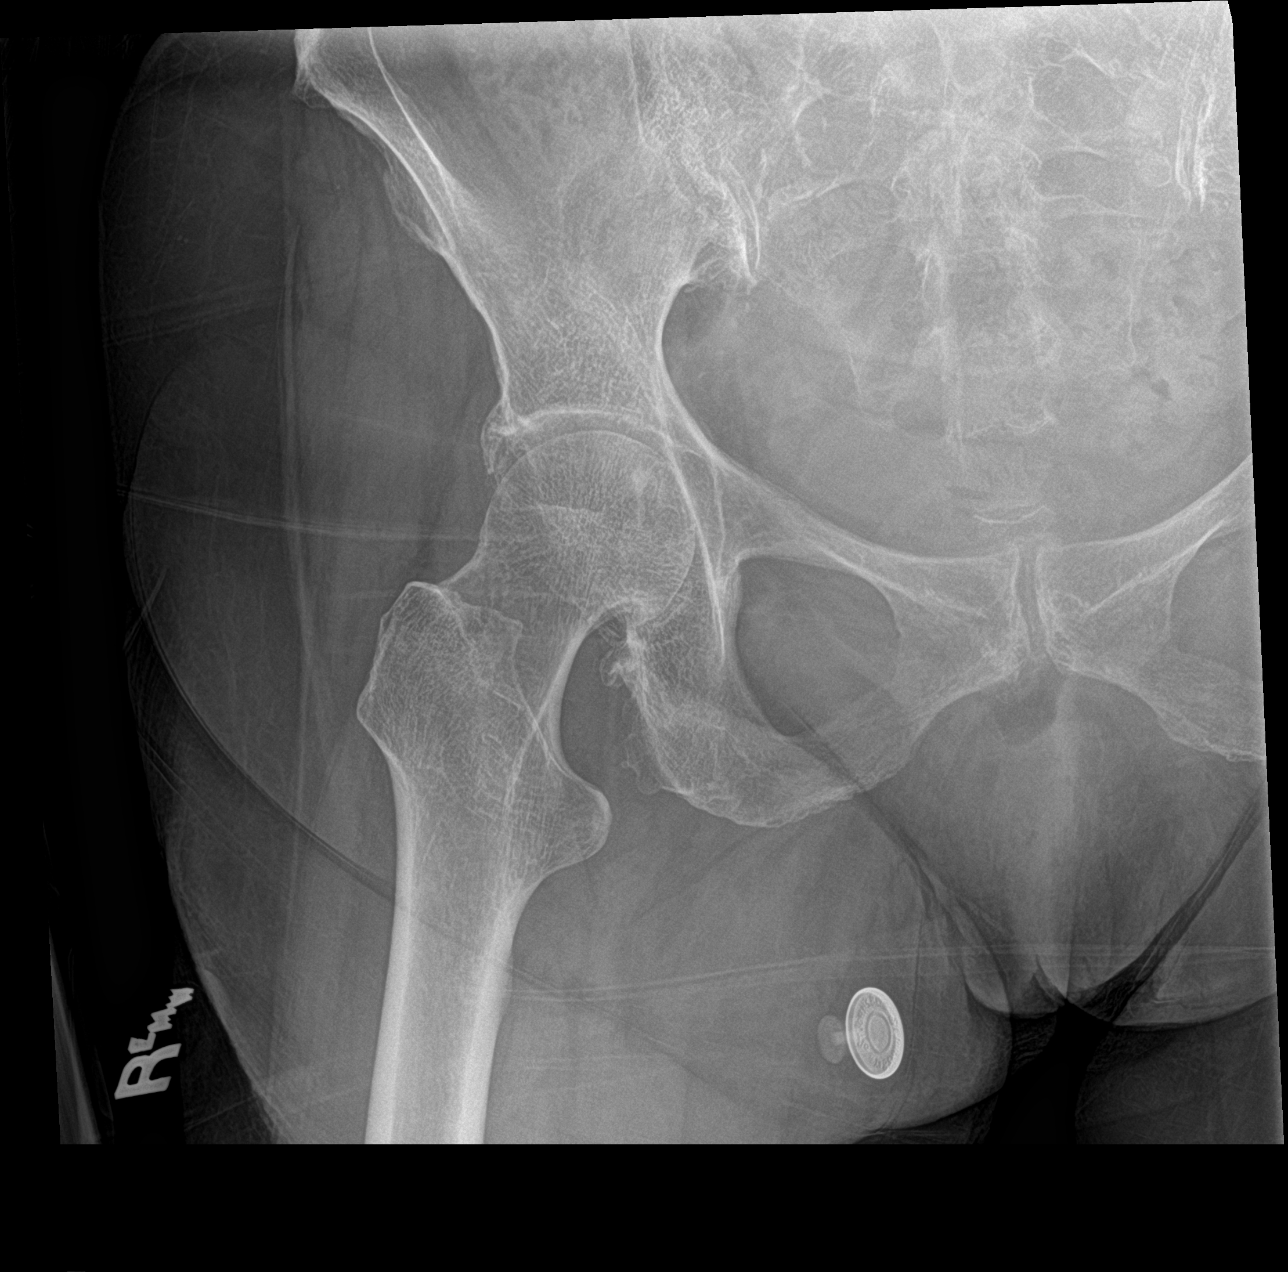

[hip ap]
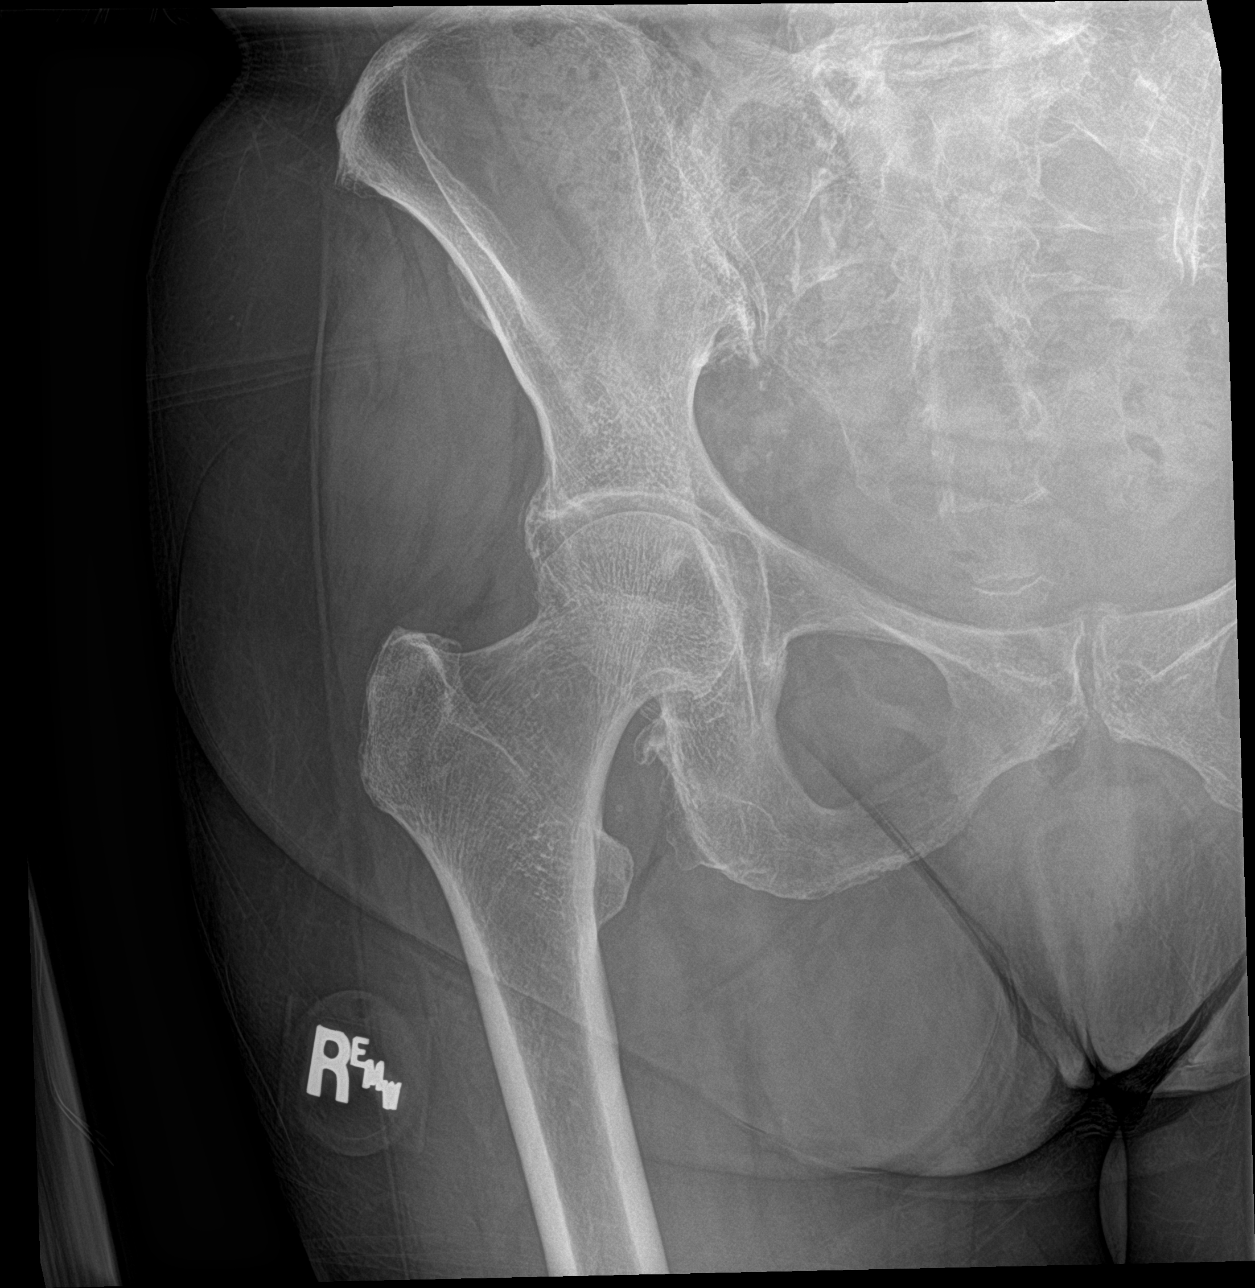

[3 of 3 positions shown; findings below may reference images not displayed]

FINDINGS: No acute fracture, subluxation or dislocation.

Degenerative changes in both hips again noted.

No suspicious focal bony lesions are noted.

Degenerative changes in the LOWER lumbar spine again noted.
IMPRESSION: .

1. No acute bony abnormality
2. Degenerative changes in the hips and LOWER lumbar spine again
noted

## 2019-04-10 ENCOUNTER — Telehealth: Payer: Self-pay | Admitting: Osteopathic Medicine

## 2019-04-10 ENCOUNTER — Other Ambulatory Visit: Payer: Self-pay | Admitting: Osteopathic Medicine

## 2019-04-10 DIAGNOSIS — E0590 Thyrotoxicosis, unspecified without thyrotoxic crisis or storm: Secondary | ICD-10-CM

## 2019-04-10 NOTE — Telephone Encounter (Signed)
Pt's daughter [name]) has been updated of med refill sent to local pharmacy. Aware of lab order for thyroid check. No other inquiries during call.

## 2019-04-10 NOTE — Telephone Encounter (Signed)
I sent the refill on the PTU 30 days, but she needs thyroid labs drawn sometime in the next month

## 2019-04-10 NOTE — Telephone Encounter (Signed)
[facility] pharmacy requesting med refill for propylthiouracil. Last thyroid check was 02/20/18, wnl. Pls advise, thanks.

## 2019-04-15 DIAGNOSIS — E0590 Thyrotoxicosis, unspecified without thyrotoxic crisis or storm: Secondary | ICD-10-CM | POA: Diagnosis not present

## 2019-04-16 ENCOUNTER — Other Ambulatory Visit: Payer: Self-pay | Admitting: Osteopathic Medicine

## 2019-04-16 DIAGNOSIS — E0590 Thyrotoxicosis, unspecified without thyrotoxic crisis or storm: Secondary | ICD-10-CM

## 2019-04-16 LAB — TSH+FREE T4: TSH W/REFLEX TO FT4: 1.15 mIU/L (ref 0.40–4.50)

## 2019-04-16 MED ORDER — PROPYLTHIOURACIL 50 MG PO TABS: 50.0000 mg | ORAL_TABLET | Freq: Two times a day (BID) | ORAL | 1 refills | Status: DC

## 2019-04-21 ENCOUNTER — Telehealth: Payer: Self-pay

## 2019-04-21 DIAGNOSIS — M5030 Other cervical disc degeneration, unspecified cervical region: Secondary | ICD-10-CM

## 2019-04-21 NOTE — Telephone Encounter (Signed)
Pt's daughter called requesting med refill for hydrocodone. Pls send to [facility] in [place].

## 2019-04-22 MED ORDER — HYDROCODONE-ACETAMINOPHEN 10-325 MG PO TABS: 0.5000 | ORAL_TABLET | Freq: Three times a day (TID) | ORAL | 0 refills | Status: DC | PRN

## 2019-04-22 NOTE — Telephone Encounter (Signed)
Pt daughter's [name]) has been updated of med refill sent to local pharmacy. No other inquiries during call.

## 2019-04-22 NOTE — Telephone Encounter (Signed)
Sent!

## 2019-05-09 ENCOUNTER — Other Ambulatory Visit: Payer: Self-pay | Admitting: *Deleted

## 2019-05-11 ENCOUNTER — Other Ambulatory Visit: Payer: Self-pay | Admitting: Osteopathic Medicine

## 2019-05-11 DIAGNOSIS — E0590 Thyrotoxicosis, unspecified without thyrotoxic crisis or storm: Secondary | ICD-10-CM

## 2019-05-12 ENCOUNTER — Other Ambulatory Visit: Payer: Self-pay

## 2019-05-12 NOTE — Telephone Encounter (Signed)
Heather Perkins is requesting a refill on Xanax.

## 2019-05-13 MED ORDER — ALPRAZOLAM 0.5 MG PO TABS: 0.2500 mg | ORAL_TABLET | Freq: Two times a day (BID) | ORAL | 0 refills | Status: DC | PRN

## 2019-05-26 ENCOUNTER — Telehealth: Payer: Self-pay

## 2019-05-26 DIAGNOSIS — M5030 Other cervical disc degeneration, unspecified cervical region: Secondary | ICD-10-CM

## 2019-05-26 MED ORDER — HYDROCODONE-ACETAMINOPHEN 10-325 MG PO TABS: 0.5000 | ORAL_TABLET | Freq: Three times a day (TID) | ORAL | 0 refills | Status: DC | PRN

## 2019-05-26 NOTE — Telephone Encounter (Signed)
Pt's daughter [name] has been updated and is aware of covering provider's note to schedule an appt for pt. No other inquiries during call.

## 2019-05-26 NOTE — Telephone Encounter (Signed)
He will need 3 month follow up with PCP next month. Refill sent.

## 2019-05-26 NOTE — Telephone Encounter (Signed)
Pt's daughter called requesting med refill for hydrocodone-ace for pt. Pls send to [facility] in [place]. Thanks.

## 2019-06-23 ENCOUNTER — Ambulatory Visit (INDEPENDENT_AMBULATORY_CARE_PROVIDER_SITE_OTHER): Payer: Medicare HMO | Admitting: Osteopathic Medicine

## 2019-06-23 ENCOUNTER — Encounter: Payer: Self-pay | Admitting: Osteopathic Medicine

## 2019-06-23 VITALS — BP 146/81 | HR 68 | Temp 98.0°F

## 2019-06-23 DIAGNOSIS — M25561 Pain in right knee: Secondary | ICD-10-CM | POA: Diagnosis not present

## 2019-06-23 DIAGNOSIS — M48062 Spinal stenosis, lumbar region with neurogenic claudication: Secondary | ICD-10-CM | POA: Diagnosis not present

## 2019-06-23 DIAGNOSIS — F411 Generalized anxiety disorder: Secondary | ICD-10-CM

## 2019-06-23 DIAGNOSIS — J449 Chronic obstructive pulmonary disease, unspecified: Secondary | ICD-10-CM

## 2019-06-23 DIAGNOSIS — M5030 Other cervical disc degeneration, unspecified cervical region: Secondary | ICD-10-CM | POA: Diagnosis not present

## 2019-06-23 MED ORDER — CYCLOBENZAPRINE HCL 10 MG PO TABS: 5.0000 mg | ORAL_TABLET | Freq: Three times a day (TID) | ORAL | 3 refills | Status: DC | PRN

## 2019-06-23 MED ORDER — ALPRAZOLAM 0.5 MG PO TABS: ORAL_TABLET | ORAL | 0 refills | Status: DC

## 2019-06-23 MED ORDER — GABAPENTIN 300 MG PO CAPS: 300.0000 mg | ORAL_CAPSULE | Freq: Two times a day (BID) | ORAL | 1 refills | Status: DC | PRN

## 2019-06-23 MED ORDER — ALBUTEROL SULFATE HFA 108 (90 BASE) MCG/ACT IN AERS: 2.0000 | INHALATION_SPRAY | Freq: Four times a day (QID) | RESPIRATORY_TRACT | 11 refills | Status: DC | PRN

## 2019-06-23 MED ORDER — HYDROCODONE-ACETAMINOPHEN 10-325 MG PO TABS: 0.5000 | ORAL_TABLET | Freq: Three times a day (TID) | ORAL | 0 refills | Status: DC | PRN

## 2019-06-23 MED ORDER — ALPRAZOLAM 0.5 MG PO TABS: ORAL_TABLET | ORAL | 0 refills | Status: AC

## 2019-06-23 NOTE — Progress Notes (Signed)
Virtual Visit via Video (App used: doximity) Note  I connected with      [name] on 06/23/19 at 1:30 PM by a telemedicine application and verified that I am speaking with the correct person using two identifiers.  Patient is at home I am in office   I discussed the limitations of evaluation and management by telemedicine and the availability of in person appointments. The patient expressed understanding and agreed to proceed.  History of Present Illness: Heather Perkins is a 85 y.o. female who would like to discuss pain meds refilled    Indication for chronic opioid: The primary encounter diagnosis was DDD (degenerative disc disease), cervical. A diagnosis of Spinal stenosis of lumbar region with neurogenic claudication was also pertinent to this visit. Medication and dose: Hydrocodone-APAP 10-325 mg, 0.5-1 tablets po q8h prn  # pills per month: 90 Last UDS date: overdue but COVID! Opioid Treatment Agreement signed (Y/N): yes but due to update Opioid Treatment Agreement last reviewed with patient:   [facility] reviewed this encounter (include red flags): Yeslast filled 05/28/19 #90, prior to that 04/22/19, 03/20/19. No concerns.    Woke up today with pain in R knee. No known injury. Has been limping around on it throughout the day, knee is moving better. No swelling of the knee itself. No swelling in lower extremity.     Observations/Objective: BP (!) 146/81 (BP Location: Left Arm, Patient Position: Sitting, Cuff Size: Normal)   Pulse 68   Temp 98 F (36.7 C) (Oral)  BP Readings from Last 3 Encounters:  06/23/19 (!) 146/81  03/20/19 115/61  03/14/19 (!) 97/59   Exam: Normal Speech.  NAD  Lab and Radiology Results No results found for this or any previous visit (from the past 72 hour(s)). No results found.        Assessment and Plan: 85 y.o. female with The primary encounter diagnosis was DDD (degenerative disc disease), cervical. Diagnoses of Spinal stenosis of  lumbar region with neurogenic claudication, Generalized anxiety disorder, Chronic obstructive pulmonary disease, unspecified COPD type ([place]), and Acute pain of right knee were also pertinent to this visit.   PDMP reviewed during this encounter. No orders of the defined types were placed in this encounter.  Meds ordered this encounter  Medications  . gabapentin (NEURONTIN) 300 MG capsule    Sig: Take 1 capsule (300 mg total) by mouth 2 (two) times daily as needed.    Dispense:  180 capsule    Refill:  1  . DISCONTD: ALPRAZolam (XANAX) 0.5 MG tablet    Sig: TAKE 1/2-1 TABLET BY MOUTH TWICE DAILY AS NEEDED (SEVERE ANXIETY) #30 for thirty days    Dispense:  30 tablet    Refill:  0  . cyclobenzaprine (FLEXERIL) 10 MG tablet    Sig: Take 0.5-1 tablets (5-10 mg total) by mouth 3 (three) times daily as needed for muscle spasms.    Dispense:  90 tablet    Refill:  3  . albuterol (VENTOLIN HFA) 108 (90 Base) MCG/ACT inhaler    Sig: Inhale 2 puffs into the lungs every 6 (six) hours as needed for wheezing or shortness of breath.    Dispense:  18 g    Refill:  11  . HYDROcodone-acetaminophen (NORCO) 10-325 MG tablet    Sig: Take 0.5-1 tablets by mouth every 8 (eight) hours as needed.    Dispense:  90 tablet    Refill:  0  . ALPRAZolam (XANAX) 0.5 MG tablet  Sig: TAKE 1/2-1 TABLET BY MOUTH TWICE DAILY AS NEEDED (SEVERE ANXIETY) #30 for thirty days    Dispense:  30 tablet    Refill:  0      Follow Up Instructions: Return in about 3 months (around 09/22/2019) for follow up pain medications. If knee is an issue tomorrow morning, schedule visit w/ sports med! .    I discussed the assessment and treatment plan with the patient. The patient was provided an opportunity to ask questions and all were answered. The patient agreed with the plan and demonstrated an understanding of the instructions.   The patient was advised to call back or seek an in-person evaluation if any new concerns, if  symptoms worsen or if the condition fails to improve as anticipated.  25 minutes of non-face-to-face time was provided during this encounter.                      Historical information moved to improve visibility of documentation.  Past Medical History:  Diagnosis Date  . Allergy   . Cancer ([place])    skin  . Colitis, ischemic ([place])   . Depression   . Fibromyalgia   . GERD (gastroesophageal reflux disease)   . Hyperthyroidism 2010   sees Dr Marcello Moores, did RAI  . Peripheral vascular disease ([place])   . Shingles    Past Surgical History:  Procedure Laterality Date  . ABDOMINAL HYSTERECTOMY  1975   for dub  . APPENDECTOMY  1939  . BREAST BIOPSY  1954  . [place]   right  . CATARACT EXTRACTION  2009   right and left.   . CERVICAL FUSION    . [place]  . ORIF ANKLE FRACTURE  1979   left  . TONSILLECTOMY     Social History   Tobacco Use  . Smoking status: Current Every Day Smoker    Packs/day: 1.00    Years: 50.00    Pack years: 50.00    Types: Cigarettes  . Smokeless tobacco: Never Used  . Tobacco comment: pt states that she has been trying to quit but can't seem to quit  Substance Use Topics  . Alcohol use: No    Frequency: Never   family history includes Cancer in her daughter; Hyperlipidemia in her daughter; Hypertension in her sister.  Medications: Current Outpatient Medications  Medication Sig Dispense Refill  . acetaminophen (TYLENOL) 650 MG CR tablet Take 1 tablet (650 mg total) by mouth every 8 (eight) hours as needed for pain. 90 tablet 3  . albuterol (VENTOLIN HFA) 108 (90 Base) MCG/ACT inhaler Inhale 2 puffs into the lungs every 6 (six) hours as needed for wheezing or shortness of breath. 18 g 11  . aspirin EC 81 MG tablet Take 1 tablet (81 mg total) by mouth daily. 90 tablet 3  . B Complex Vitamins (B COMPLEX PO) Take by mouth.    . Calcium-Magnesium-Zinc 500-250-12.5 MG TABS Take by mouth daily. TAKE  1 CAPSULE BY MOUTH 2 TIMES DAILY    . cilostazol (PLETAL) 100 MG tablet Take 1 tablet (100 mg total) by mouth 2 (two) times daily. 180 tablet 2  . cyclobenzaprine (FLEXERIL) 10 MG tablet Take 0.5-1 tablets (5-10 mg total) by mouth 3 (three) times daily as needed for muscle spasms. 90 tablet 3  . fish oil-omega-3 fatty acids 1000 MG capsule Take 2 g by mouth daily.    . fluticasone (FLONASE) 50 MCG/ACT nasal spray  PLACE 2 SPRAYS INTO BOTH NOSTRILS DAILY 16 g 11  . gabapentin (NEURONTIN) 300 MG capsule Take 1 capsule (300 mg total) by mouth 2 (two) times daily as needed. 180 capsule 1  . HYDROcodone-acetaminophen (NORCO) 10-325 MG tablet Take 0.5-1 tablets by mouth every 8 (eight) hours as needed. 90 tablet 0  . magnesium oxide (MAG-OX) 400 MG tablet Take 2 tablets (800 mg total) by mouth at bedtime. 180 tablet 3  . omeprazole (PRILOSEC) 20 MG capsule TAKE 1 CAPSULE(20 MG) BY MOUTH TWICE DAILY BEFORE A MEAL 180 capsule 3  . ondansetron (ZOFRAN) 4 MG tablet Take 1-2 tablets (4-8 mg total) by mouth every 8 (eight) hours as needed for nausea or vomiting. 40 tablet 0  . polyethylene glycol (MIRALAX / GLYCOLAX) packet Take 17 g by mouth daily.      . pravastatin (PRAVACHOL) 40 MG tablet TAKE 1 TABLET(40 MG) BY MOUTH DAILY 90 tablet 0  . promethazine (PHENERGAN) 25 MG tablet Take by mouth.    . propylthiouracil (PTU) 50 MG tablet Take 1 tablet (50 mg total) by mouth 2 (two) times daily. 180 tablet 1  . SPIRIVA RESPIMAT 2.5 MCG/ACT AERS 2 INHALATIONS DAILY TO HELP REDUCE COUGH 4 g 11  . ALPRAZolam (XANAX) 0.5 MG tablet TAKE 1/2-1 TABLET BY MOUTH TWICE DAILY AS NEEDED (SEVERE ANXIETY) #30 for thirty days 30 tablet 0   No current facility-administered medications for this visit.    Allergies  Allergen Reactions  . Amitriptyline Other (See Comments)  . Amoxicillin-Pot Clavulanate Nausea And Vomiting    Vomiting   . Bupropion Other (See Comments)    constipation   . Citalopram Hydrobromide Other (See  Comments)  . Codeine Nausea And Vomiting and Nausea Only  . Erythromycin Nausea And Vomiting  . Escitalopram Oxalate Nausea And Vomiting  . Methimazole Other (See Comments)  . Neomycin-Bacitracin Zn-Polymyx Other (See Comments)  . Neomycin-Bacitracin-Polymyxin  [Bacitracin-Neomycin-Polymyxin] Other (See Comments)  . Other Nausea And Vomiting, Nausea Only and Rash    All Mycins  . Pentazocine Nausea And Vomiting  . Pitavastatin Other (See Comments)    Headache.   . Salsalate Other (See Comments)  . Tetracyclines & Related Rash  . Azithromycin   . Crestor [Rosuvastatin Calcium] Nausea Only  . Elavil [Amitriptyline Hcl]   . Livalo [Pitavastatin Calcium] Other (See Comments)    Headache.   . Naproxen     nausea  . Neosporin [Neomycin-Polymyxin-Gramicidin]   . Prozac [Fluoxetine]   . Rosuvastatin     PDMP reviewed during this encounter. No orders of the defined types were placed in this encounter.  Meds ordered this encounter  Medications  . gabapentin (NEURONTIN) 300 MG capsule    Sig: Take 1 capsule (300 mg total) by mouth 2 (two) times daily as needed.    Dispense:  180 capsule    Refill:  1  . DISCONTD: ALPRAZolam (XANAX) 0.5 MG tablet    Sig: TAKE 1/2-1 TABLET BY MOUTH TWICE DAILY AS NEEDED (SEVERE ANXIETY) #30 for thirty days    Dispense:  30 tablet    Refill:  0  . cyclobenzaprine (FLEXERIL) 10 MG tablet    Sig: Take 0.5-1 tablets (5-10 mg total) by mouth 3 (three) times daily as needed for muscle spasms.    Dispense:  90 tablet    Refill:  3  . albuterol (VENTOLIN HFA) 108 (90 Base) MCG/ACT inhaler    Sig: Inhale 2 puffs into the lungs every 6 (six) hours as needed for  wheezing or shortness of breath.    Dispense:  18 g    Refill:  11  . HYDROcodone-acetaminophen (NORCO) 10-325 MG tablet    Sig: Take 0.5-1 tablets by mouth every 8 (eight) hours as needed.    Dispense:  90 tablet    Refill:  0  . ALPRAZolam (XANAX) 0.5 MG tablet    Sig: TAKE 1/2-1 TABLET BY MOUTH  TWICE DAILY AS NEEDED (SEVERE ANXIETY) #30 for thirty days    Dispense:  30 tablet    Refill:  0

## 2019-07-14 ENCOUNTER — Ambulatory Visit: Payer: Medicare HMO

## 2019-07-29 ENCOUNTER — Ambulatory Visit: Payer: Medicare HMO

## 2019-08-04 NOTE — Progress Notes (Signed)
Subjective:   Heather Perkins is a 85 y.o. female who presents for Medicare Annual (Subsequent) preventive examination.  Review of Systems:  No ROS.  Medicare Wellness Virtual Visit.  Visual/audio telehealth visit, UTA vital signs.   See social history for additional risk factors.    Cardiac Risk Factors include: advanced age (>22men, >58 women);sedentary lifestyle;smoking/ tobacco exposure Sleep patterns:  Getting 6-7 hours of sleep a night. Wakes up 1 times during the night to void. Wakes up and feels sluggish.  Home Safety/Smoke Alarms: Feels safe in home. Smoke alarms in place.  Living environment; Lives with daughter Heather Perkins. No stairs in the home. Shower is a walk in shower  With no grab bars in place. Seat Belt Safety/Bike Helmet: Wears seat belt.   Female:   Pap-  Aged out     Mammo- Aged out       Dexa scan- ordered       CCS-Aged out      Objective:     Vitals: There were no vitals taken for this visit.  There is no height or weight on file to calculate BMI.  Advanced Directives 08/05/2019 01/23/2014  Does Patient Have a Medical Advance Directive? Yes Patient has advance directive, copy not in chart  Type of Advance Directive [place];Living will [place]  Does patient want to make changes to medical advance directive? No - Patient declined -  Copy of [place] in Chart? No - copy requested Copy requested from family    Tobacco Social History   Tobacco Use  Smoking Status Current Every Day Smoker  . Packs/day: 1.00  . Years: 50.00  . Pack years: 50.00  . Types: Cigarettes  Smokeless Tobacco Never Used  Tobacco Comment   pt states that she has been trying to quit but can't seem to quit     Ready to quit: Not Answered Counseling given: Not Answered Comment: pt states that she has been trying to quit but can't seem to quit   Clinical Intake:  Pre-visit preparation completed: Yes  Pain : No/denies  pain     Nutritional Risks: None Diabetes: No  How often do you need to have someone help you when you read instructions, pamphlets, or other written materials from your doctor or pharmacy?: 1 - Never What is the last grade level you completed in school?: 14  Interpreter Needed?: No  Information entered by :: Orlie Dakin, LPN  Past Medical History:  Diagnosis Date  . Allergy   . Cancer ([place])    skin  . Colitis, ischemic ([place])   . Depression   . Fibromyalgia   . GERD (gastroesophageal reflux disease)   . Hyperthyroidism 2010   sees Dr Marcello Moores, did RAI  . Peripheral vascular disease ([place])   . Shingles    Past Surgical History:  Procedure Laterality Date  . ABDOMINAL HYSTERECTOMY  1975   for dub  . APPENDECTOMY  1939  . BREAST BIOPSY  1954  . [place]   right  . CATARACT EXTRACTION  2009   right and left.   . CERVICAL FUSION    . [place]  . ORIF ANKLE FRACTURE  1979   left  . TONSILLECTOMY     Family History  Problem Relation Age of Onset  . Hypertension Sister   . Cancer Daughter   . Hyperlipidemia Daughter    Social History   Socioeconomic History  .  Marital status: Single    Spouse name: Not on file  . Number of children: 3  . Years of education: college  . Highest education level: 12th grade  Occupational History  . Occupation: retired    Comment: LPN  Social Needs  . Financial resource strain: Not hard at all  . Food insecurity    Worry: Never true    Inability: Never true  . Transportation needs    Medical: No    Non-medical: No  Tobacco Use  . Smoking status: Current Every Day Smoker    Packs/day: 1.00    Years: 50.00    Pack years: 50.00    Types: Cigarettes  . Smokeless tobacco: Never Used  . Tobacco comment: pt states that she has been trying to quit but can't seem to quit  Substance and Sexual Activity  . Alcohol use: No    Frequency: Never  . Drug use: No  . Sexual activity: Not Currently   Lifestyle  . Physical activity    Days per week: 0 days    Minutes per session: 0 min  . Stress: Not at all  Relationships  . Social Herbalist on phone: Twice a week    Gets together: Never    Attends religious service: Never    Active member of club or organization: No    Attends meetings of clubs or organizations: Never    Relationship status: Divorced  Other Topics Concern  . Not on file  Social History Narrative   Pateint is hard of hearing    Outpatient Encounter Medications as of 08/05/2019  Medication Sig  . acetaminophen (TYLENOL) 650 MG CR tablet Take 1 tablet (650 mg total) by mouth every 8 (eight) hours as needed for pain.  Marland Kitchen albuterol (VENTOLIN HFA) 108 (90 Base) MCG/ACT inhaler Inhale 2 puffs into the lungs every 6 (six) hours as needed for wheezing or shortness of breath.  Marland Kitchen aspirin EC 81 MG tablet Take 1 tablet (81 mg total) by mouth daily.  . B Complex Vitamins (B COMPLEX PO) Take by mouth.  . Calcium-Magnesium-Zinc 500-250-12.5 MG TABS Take by mouth daily. TAKE 1 CAPSULE BY MOUTH 2 TIMES DAILY  . cilostazol (PLETAL) 100 MG tablet Take 1 tablet (100 mg total) by mouth 2 (two) times daily.  . fish oil-omega-3 fatty acids 1000 MG capsule Take 2 g by mouth daily.  . fluticasone (FLONASE) 50 MCG/ACT nasal spray PLACE 2 SPRAYS INTO BOTH NOSTRILS DAILY  . gabapentin (NEURONTIN) 300 MG capsule Take 1 capsule (300 mg total) by mouth 2 (two) times daily as needed.  Marland Kitchen HYDROcodone-acetaminophen (NORCO) 10-325 MG tablet Take 0.5-1 tablets by mouth every 8 (eight) hours as needed.  . magnesium oxide (MAG-OX) 400 MG tablet Take 2 tablets (800 mg total) by mouth at bedtime.  Marland Kitchen omeprazole (PRILOSEC) 20 MG capsule TAKE 1 CAPSULE(20 MG) BY MOUTH TWICE DAILY BEFORE A MEAL  . ondansetron (ZOFRAN) 4 MG tablet Take 1-2 tablets (4-8 mg total) by mouth every 8 (eight) hours as needed for nausea or vomiting.  . polyethylene glycol (MIRALAX / GLYCOLAX) packet Take 17 g by mouth  daily.    . pravastatin (PRAVACHOL) 40 MG tablet TAKE 1 TABLET(40 MG) BY MOUTH DAILY  . propylthiouracil (PTU) 50 MG tablet Take 1 tablet (50 mg total) by mouth 2 (two) times daily.  Marland Kitchen SPIRIVA RESPIMAT 2.5 MCG/ACT AERS 2 INHALATIONS DAILY TO HELP REDUCE COUGH  . cyclobenzaprine (FLEXERIL) 10 MG tablet Take 0.5-1  tablets (5-10 mg total) by mouth 3 (three) times daily as needed for muscle spasms. (Patient not taking: Reported on 08/05/2019)  . promethazine (PHENERGAN) 25 MG tablet Take by mouth.   No facility-administered encounter medications on file as of 08/05/2019.     Activities of Daily Living In your present state of health, do you have any difficulty performing the following activities: 08/05/2019  Hearing? Y  Comment hard of hearing- wears hearing aids in both ears  Vision? N  Difficulty concentrating or making decisions? Y  Comment at times  Walking or climbing stairs? N  Dressing or bathing? N  Doing errands, shopping? N  Preparing Food and eating ? N  Using the Toilet? N  In the past six months, have you accidently leaked urine? N  Do you have problems with loss of bowel control? N  Managing your Medications? N  Managing your Finances? N  Housekeeping or managing your Housekeeping? N  Some recent data might be hidden    Patient Care Team: Emeterio Reeve, DO as PCP - General (Osteopathic Medicine)    Assessment:   This is a routine wellness examination for Sayuri.Physical assessment deferred to PCP.   Exercise Activities and Dietary recommendations Current Exercise Habits: The patient does not participate in regular exercise at present, Exercise limited by: respiratory conditions(s)(COPD) Diet Eats a healthy diet Breakfast: cereal Lunch: skips or will eat a sandwich Dinner:  Meat and vegetables and salad     Goals    . Exercise 3x per week (30 min per time)     Try and start back walking again.       Fall Risk Fall Risk  08/05/2019 07/09/2018 05/15/2017   Falls in the past year? 0 Yes No  Comment - fell backwards, no injury -  Number falls in past yr: 0 1 -  Injury with Fall? 0 No -  Follow up Falls prevention discussed Falls prevention discussed -   Is the patient's home free of loose throw rugs in walkways, pet beds, electrical cords, etc?   yes      Grab bars in the bathroom? yes      Handrails on the stairs?   no      Adequate lighting?   yes   Depression Screen PHQ 2/9 Scores 08/05/2019 07/09/2018 11/15/2017 05/15/2017  PHQ - 2 Score 0 0 1 2  PHQ- 9 Score - - 6 -     Cognitive Function     6CIT Screen 08/05/2019 07/09/2018  What Year? 0 points 0 points  What month? 0 points 0 points  What time? 0 points 0 points  Count back from 20 0 points 0 points  Months in reverse 2 points 0 points  Repeat phrase 0 points 0 points  Total Score 2 0    Immunization History  Administered Date(s) Administered  . Influenza, High Dose Seasonal PF 08/15/2017  . Influenza,inj,Quad PF,6+ Mos 11/15/2016  . Pneumococcal Conjugate-13 11/15/2016  . Pneumococcal Polysaccharide-23 10/09/2008  . Tdap 03/27/2012    Screening Tests Health Maintenance  Topic Date Due  . DEXA SCAN  11/17/2000  . INFLUENZA VACCINE  06/22/2020 (Originally 05/10/2019)  . TETANUS/TDAP  03/27/2022  . PNA vac Low Risk Adult  Completed        Plan:      Ms. Rael , Thank you for taking time to come for your Medicare Wellness Visit. I appreciate your ongoing commitment to your health goals. Please review the following plan we discussed and  let me know if I can assist you in the future.  Please schedule your next medicare wellness visit with me in 1 yr.   These are the goals we discussed: Goals    . Exercise 3x per week (30 min per time)     Try and start back walking again.       This is a list of the screening recommended for you and due dates:  Health Maintenance  Topic Date Due  . DEXA scan (bone density measurement)  11/17/2000  . Flu Shot  06/22/2020*   . Tetanus Vaccine  03/27/2022  . Pneumonia vaccines  Completed  *Topic was postponed. The date shown is not the original due date.      I have personally reviewed and noted the following in the patient's chart:   . Medical and social history . Use of alcohol, tobacco or illicit drugs  . Current medications and supplements . Functional ability and status . Nutritional status . Physical activity . Advanced directives . List of other physicians . Hospitalizations, surgeries, and ER visits in previous 12 months . Vitals . Screenings to include cognitive, depression, and falls . Referrals and appointments  In addition, I have reviewed and discussed with patient certain preventive protocols, quality metrics, and best practice recommendations. A written personalized care plan for preventive services as well as general preventive health recommendations were provided to patient.     Joanne Chars, LPN  [id number]

## 2019-08-05 ENCOUNTER — Ambulatory Visit (INDEPENDENT_AMBULATORY_CARE_PROVIDER_SITE_OTHER): Payer: Medicare HMO | Admitting: *Deleted

## 2019-08-05 VITALS — BP 97/55 | HR 73 | Ht 63.0 in | Wt 132.0 lb

## 2019-08-05 DIAGNOSIS — Z0000 Encounter for general adult medical examination without abnormal findings: Secondary | ICD-10-CM

## 2019-08-05 DIAGNOSIS — Z13820 Encounter for screening for osteoporosis: Secondary | ICD-10-CM | POA: Diagnosis not present

## 2019-08-05 NOTE — Patient Instructions (Signed)
Heather Perkins , Thank you for taking time to come for your Medicare Wellness Visit. I appreciate your ongoing commitment to your health goals. Please review the following plan we discussed and let me know if I can assist you in the future.  Please schedule your next medicare wellness visit with me in 1 yr. These are the goals we discussed: Goals    . Exercise 3x per week (30 min per time)     Try and start back walking again.

## 2019-08-11 ENCOUNTER — Other Ambulatory Visit: Payer: Self-pay

## 2019-08-11 DIAGNOSIS — I739 Peripheral vascular disease, unspecified: Secondary | ICD-10-CM

## 2019-08-11 MED ORDER — CILOSTAZOL 100 MG PO TABS: 100.0000 mg | ORAL_TABLET | Freq: Two times a day (BID) | ORAL | 2 refills | Status: DC

## 2019-08-13 ENCOUNTER — Ambulatory Visit (INDEPENDENT_AMBULATORY_CARE_PROVIDER_SITE_OTHER): Payer: Medicare HMO

## 2019-08-13 ENCOUNTER — Other Ambulatory Visit: Payer: Self-pay

## 2019-08-13 DIAGNOSIS — Z13820 Encounter for screening for osteoporosis: Secondary | ICD-10-CM

## 2019-08-13 DIAGNOSIS — M810 Age-related osteoporosis without current pathological fracture: Secondary | ICD-10-CM | POA: Diagnosis not present

## 2019-08-13 DIAGNOSIS — Z780 Asymptomatic menopausal state: Secondary | ICD-10-CM | POA: Diagnosis not present

## 2019-08-18 MED ORDER — ALENDRONATE SODIUM 70 MG PO TABS: 70.0000 mg | ORAL_TABLET | ORAL | 3 refills | Status: DC

## 2019-08-18 NOTE — Addendum Note (Signed)
Addended by: Maryla Morrow on: 08/18/2019 09:50 AM   Modules accepted: Orders

## 2019-08-20 ENCOUNTER — Telehealth: Payer: Self-pay

## 2019-08-20 DIAGNOSIS — M5030 Other cervical disc degeneration, unspecified cervical region: Secondary | ICD-10-CM

## 2019-08-20 MED ORDER — HYDROCODONE-ACETAMINOPHEN 10-325 MG PO TABS: 0.5000 | ORAL_TABLET | Freq: Three times a day (TID) | ORAL | 0 refills | Status: DC | PRN

## 2019-08-20 NOTE — Telephone Encounter (Signed)
Pt's daughter left a vm msg requesting med refill for hydrocodone. Pls send to [facility] in [place].

## 2019-08-22 ENCOUNTER — Telehealth: Payer: Self-pay

## 2019-08-22 NOTE — Telephone Encounter (Signed)
Patient's daughter calls, she is having spells where she is getting very lightheaded and and nauseated. No vomiting. Happens "every so often" but was bad last night. Patient stated "every time I stood up I felt like I was going to fall down". Patient assures me she is hydrated and drinking plenty of water. Some pain behind left breast. Denies any shortness of breath. Reports drops in heart rate.   I advised patient's daughter to take her to ER to be evaluated. Daughter agreeable.   FYI to PCP

## 2019-08-22 NOTE — Telephone Encounter (Signed)
Agree w/ advice, thanks for FYI!

## 2019-08-23 DIAGNOSIS — G9389 Other specified disorders of brain: Secondary | ICD-10-CM | POA: Diagnosis not present

## 2019-08-23 DIAGNOSIS — J189 Pneumonia, unspecified organism: Secondary | ICD-10-CM | POA: Diagnosis not present

## 2019-08-23 DIAGNOSIS — Z20828 Contact with and (suspected) exposure to other viral communicable diseases: Secondary | ICD-10-CM | POA: Diagnosis not present

## 2019-08-23 DIAGNOSIS — J181 Lobar pneumonia, unspecified organism: Secondary | ICD-10-CM | POA: Diagnosis not present

## 2019-08-23 DIAGNOSIS — R0602 Shortness of breath: Secondary | ICD-10-CM | POA: Diagnosis not present

## 2019-08-23 DIAGNOSIS — R110 Nausea: Secondary | ICD-10-CM | POA: Diagnosis not present

## 2019-08-23 DIAGNOSIS — R42 Dizziness and giddiness: Secondary | ICD-10-CM | POA: Diagnosis not present

## 2019-08-23 DIAGNOSIS — R918 Other nonspecific abnormal finding of lung field: Secondary | ICD-10-CM | POA: Diagnosis not present

## 2019-08-23 DIAGNOSIS — R05 Cough: Secondary | ICD-10-CM | POA: Diagnosis not present

## 2019-08-23 DIAGNOSIS — M1990 Unspecified osteoarthritis, unspecified site: Secondary | ICD-10-CM | POA: Diagnosis not present

## 2019-08-23 DIAGNOSIS — R9431 Abnormal electrocardiogram [ECG] [EKG]: Secondary | ICD-10-CM | POA: Diagnosis not present

## 2019-08-23 DIAGNOSIS — M5030 Other cervical disc degeneration, unspecified cervical region: Secondary | ICD-10-CM | POA: Diagnosis not present

## 2019-08-23 DIAGNOSIS — H6123 Impacted cerumen, bilateral: Secondary | ICD-10-CM | POA: Diagnosis not present

## 2019-08-23 DIAGNOSIS — F419 Anxiety disorder, unspecified: Secondary | ICD-10-CM | POA: Diagnosis not present

## 2019-08-28 ENCOUNTER — Other Ambulatory Visit: Payer: Self-pay

## 2019-08-28 ENCOUNTER — Ambulatory Visit (INDEPENDENT_AMBULATORY_CARE_PROVIDER_SITE_OTHER): Payer: Medicare HMO | Admitting: Osteopathic Medicine

## 2019-08-28 ENCOUNTER — Encounter: Payer: Self-pay | Admitting: Osteopathic Medicine

## 2019-08-28 VITALS — BP 122/73 | HR 57 | Temp 98.1°F | Wt 123.0 lb

## 2019-08-28 DIAGNOSIS — J189 Pneumonia, unspecified organism: Secondary | ICD-10-CM

## 2019-08-28 NOTE — Progress Notes (Signed)
HPI: Heather Perkins is a 85 y.o. female who  has a past medical history of Allergy, Cancer ([place]), Colitis, ischemic ([place]), Depression, Fibromyalgia, GERD (gastroesophageal reflux disease), Hyperthyroidism (2010), Peripheral vascular disease ([place]), and Shingles.  she presents to [facility] today, 08/28/19,  for chief complaint of:  ER follow-up  . Poor historian, accompanied by her daughter Heather Perkins who helps with history.  Patient states that she went to the ER "because I was in pain", though based on record review from when office was contacted and when patient went to the ER, there was some concerns for shortness of breath . Patient states she is still in pain now, "everywhere," not sure how often she is taking her pain medications, daughter states maybe 2 or 3 times per day. . Reports significant fatigue, no fever, chronic smoker's cough but no serious shortness of breath.    At today's visit 08/28/19 ... PMH, PSH, FH reviewed and updated as needed.  Current medication list and allergy/intolerance hx reviewed and updated as needed. (See remainder of HPI, ROS, Phys Exam below)   No results found.  No results found for this or any previous visit (from the past 72 hour(s)).        ASSESSMENT/PLAN: The encounter diagnosis was Community acquired pneumonia, unspecified laterality.   Advised to finish out antibiotics  I think the body aches are due to her pneumonia illness, possibly viral related though thankfully Covid and flu testing was negative though this could certainly be something else.  I am unable to review x-ray images from [facility], bilateral pulmonary infiltrates were noted on the report, will get repeat chest x-ray on Monday and patient/daughter were advised to take her to ER if worse over the weekend  Patient is concerned about some spots that appear to be seborrheic keratoses, but scabbed over appearance, she picks at these a lot, which  could explain it.  I advised that we could do a biopsy/cryotherapy, patient declines for today, daughter is okay to take pictures and monitor this and will revisit the issue when patient is feeling better.  Seems unlikely to be malignancy but biopsy would give [place] reassurance versus definitive diagnosis   Orders Placed This Encounter  Procedures  . CXR 2-VIEW     No orders of the defined types were placed in this encounter.   Patient Instructions  Can take the pain medications up to 4 times per day Let's get an Xray on Monday and go from there       Follow-up plan: Return for Xray on Monday - no apt needed .                                                 ################################################# ################################################# ################################################# #################################################    Current Meds  Medication Sig  . acetaminophen (TYLENOL) 650 MG CR tablet Take 1 tablet (650 mg total) by mouth every 8 (eight) hours as needed for pain.  Marland Kitchen albuterol (VENTOLIN HFA) 108 (90 Base) MCG/ACT inhaler Inhale 2 puffs into the lungs every 6 (six) hours as needed for wheezing or shortness of breath.  Marland Kitchen alendronate (FOSAMAX) 70 MG tablet Take 1 tablet (70 mg total) by mouth every 7 (seven) days.  Marland Kitchen aspirin EC 81 MG tablet Take 1 tablet (81 mg total) by mouth daily.  . B Complex Vitamins (  B COMPLEX PO) Take by mouth.  . Calcium-Magnesium-Zinc 500-250-12.5 MG TABS Take by mouth daily. TAKE 1 CAPSULE BY MOUTH 2 TIMES DAILY  . cilostazol (PLETAL) 100 MG tablet Take 1 tablet (100 mg total) by mouth 2 (two) times daily.  . cyclobenzaprine (FLEXERIL) 10 MG tablet Take 0.5-1 tablets (5-10 mg total) by mouth 3 (three) times daily as needed for muscle spasms.  . fish oil-omega-3 fatty acids 1000 MG capsule Take 2 g by mouth daily.  . fluticasone (FLONASE) 50 MCG/ACT nasal spray PLACE 2  SPRAYS INTO BOTH NOSTRILS DAILY  . gabapentin (NEURONTIN) 300 MG capsule Take 1 capsule (300 mg total) by mouth 2 (two) times daily as needed.  Marland Kitchen HYDROcodone-acetaminophen (NORCO) 10-325 MG tablet Take 0.5-1 tablets by mouth every 8 (eight) hours as needed.  . magnesium oxide (MAG-OX) 400 MG tablet Take 2 tablets (800 mg total) by mouth at bedtime.  Marland Kitchen omeprazole (PRILOSEC) 20 MG capsule TAKE 1 CAPSULE(20 MG) BY MOUTH TWICE DAILY BEFORE A MEAL  . ondansetron (ZOFRAN) 4 MG tablet Take 1-2 tablets (4-8 mg total) by mouth every 8 (eight) hours as needed for nausea or vomiting.  . polyethylene glycol (MIRALAX / GLYCOLAX) packet Take 17 g by mouth daily.    . pravastatin (PRAVACHOL) 40 MG tablet TAKE 1 TABLET(40 MG) BY MOUTH DAILY  . promethazine (PHENERGAN) 25 MG tablet Take by mouth.  . propylthiouracil (PTU) 50 MG tablet Take 1 tablet (50 mg total) by mouth 2 (two) times daily.  Marland Kitchen SPIRIVA RESPIMAT 2.5 MCG/ACT AERS 2 INHALATIONS DAILY TO HELP REDUCE COUGH    Allergies  Allergen Reactions  . Amitriptyline Other (See Comments)  . Amoxicillin-Pot Clavulanate Nausea And Vomiting    Vomiting   . Bupropion Other (See Comments)    constipation   . Citalopram Hydrobromide Other (See Comments)  . Codeine Nausea And Vomiting and Nausea Only  . Erythromycin Nausea And Vomiting  . Escitalopram Oxalate Nausea And Vomiting  . Methimazole Other (See Comments)  . Neomycin-Bacitracin Zn-Polymyx Other (See Comments)  . Neomycin-Bacitracin-Polymyxin  [Bacitracin-Neomycin-Polymyxin] Other (See Comments)  . Other Nausea And Vomiting, Nausea Only and Rash    All Mycins  . Pentazocine Nausea And Vomiting  . Pitavastatin Other (See Comments)    Headache.   . Salsalate Other (See Comments)  . Tetracyclines & Related Rash  . Azithromycin   . Crestor [Rosuvastatin Calcium] Nausea Only  . Elavil [Amitriptyline Hcl]   . Livalo [Pitavastatin Calcium] Other (See Comments)    Headache.   . Naproxen     nausea   . Neosporin [Neomycin-Polymyxin-Gramicidin]   . Prozac [Fluoxetine]   . Rosuvastatin        Review of Systems:  Constitutional: +recent illness, no fever today   HEENT: +headache, no vision change  Cardiac: No  chest pain, No  pressure, No palpitations  Respiratory:  No  shortness of breath. No  Cough  Gastrointestinal: No  abdominal pain, no change on bowel habits   Musculoskeletal: +widespread myalgia/arthralgia  Neurologic: No  weakness, No  Dizziness  Psychiatric: No  concerns with depression, No  concerns with anxiety  Exam:  BP 122/73 (BP Location: Right Arm, Patient Position: Sitting, Cuff Size: Normal)   Pulse (!) 57   Temp 98.1 F (36.7 C) (Oral)   Wt 123 lb 0.6 oz (55.8 kg)   SpO2 95%   BMI 21.80 kg/m   Constitutional: VS see above. General Appearance: alert, well-developed, well-nourished, NAD  Eyes: Normal lids and conjunctive, non-icteric  sclera  Ears, Nose, Mouth, Throat: MMM, Normal external inspection ears/nares/mouth/lips/gums.  Neck: No masses, trachea midline.   Respiratory: Normal respiratory effort. no wheeze, no rhonchi, no rales  Cardiovascular: S1/S2 normal, no murmur, no rub/gallop auscultated. RRR.   Musculoskeletal: Gait normal. Symmetric and independent movement of all extremities  Neurological: Normal balance/coordination. No tremor.  Skin: warm, dry, intact.  Patient is concerned about some spots that appear to be seborrheic keratoses, but scabbed over appearance, she picks at these a lot, which could explain it.  Psychiatric: Normal judgment/insight. Normal mood and affect. Oriented x3.       Visit summary with medication list and pertinent instructions was printed for patient to review, patient was advised to alert [place] if any updates are needed. All questions at time of visit were answered - patient instructed to contact office with any additional concerns. ER/RTC precautions were reviewed with the patient and understanding  verbalized.   Note: Total time spent 30 minutes, greater than 50% of the visit was spent face-to-face counseling and coordinating care for the following: The encounter diagnosis was Community acquired pneumonia, unspecified laterality..  Please note: voice recognition software was used to produce this document, and typos may escape review. Please contact Dr. Sheppard Coil for any needed clarifications.    Follow up plan: Return for Xray on Monday - no apt needed .

## 2019-08-28 NOTE — Patient Instructions (Signed)
Can take the pain medications up to 4 times per day Let's get an Xray on Monday and go from there

## 2019-09-01 ENCOUNTER — Ambulatory Visit (INDEPENDENT_AMBULATORY_CARE_PROVIDER_SITE_OTHER): Payer: Medicare HMO

## 2019-09-01 ENCOUNTER — Other Ambulatory Visit: Payer: Self-pay

## 2019-09-01 DIAGNOSIS — J189 Pneumonia, unspecified organism: Secondary | ICD-10-CM

## 2019-09-01 DIAGNOSIS — R0602 Shortness of breath: Secondary | ICD-10-CM | POA: Diagnosis not present

## 2019-09-01 DIAGNOSIS — R05 Cough: Secondary | ICD-10-CM | POA: Diagnosis not present

## 2019-09-03 ENCOUNTER — Telehealth: Payer: Self-pay

## 2019-09-03 MED ORDER — ALPRAZOLAM 0.5 MG PO TABS: 0.5000 mg | ORAL_TABLET | Freq: Two times a day (BID) | ORAL | 1 refills | Status: AC | PRN

## 2019-09-03 NOTE — Telephone Encounter (Signed)
Pt's daughter advised.  

## 2019-09-03 NOTE — Telephone Encounter (Signed)
Sent!

## 2019-09-03 NOTE — Telephone Encounter (Signed)
Pt's daughter called requesting med refill for alprazolam. Pls send rx to [facility].

## 2019-09-12 ENCOUNTER — Telehealth: Payer: Self-pay

## 2019-09-12 DIAGNOSIS — H9209 Otalgia, unspecified ear: Secondary | ICD-10-CM

## 2019-09-12 NOTE — Telephone Encounter (Signed)
Heather Perkins called stating that pt continues to have issues with her ear. Unable to place hearing device into ear. Pt was using carbamide peroxide drops, but is done with rx. Requesting if a refill can be sent to pharmacy. Heather Perkins is also requesting an ENT referral to have pt's ear check because pt states she feels as though something is in her ear. Pt is still having loss energy, tiredness after having pneumonia. Wants to know if that is to be expected for pt. Requesting feed back from provider. Pls advise, thanks.

## 2019-09-15 ENCOUNTER — Telehealth: Payer: Self-pay

## 2019-09-15 DIAGNOSIS — M5030 Other cervical disc degeneration, unspecified cervical region: Secondary | ICD-10-CM

## 2019-09-15 MED ORDER — HYDROCODONE-ACETAMINOPHEN 10-325 MG PO TABS: 0.5000 | ORAL_TABLET | Freq: Three times a day (TID) | ORAL | 0 refills | Status: DC | PRN

## 2019-09-15 MED ORDER — DEBROX 6.5 % OT SOLN: 10.0000 [drp] | Freq: Two times a day (BID) | OTIC | 99 refills | Status: DC

## 2019-09-15 NOTE — Telephone Encounter (Signed)
Refill sent, ENT referral has been ordered

## 2019-09-15 NOTE — Telephone Encounter (Signed)
Pt's daughter [name] has been updated of rx and referral. No other inquiries during call.

## 2019-09-15 NOTE — Telephone Encounter (Signed)
Pts's daughter [name] requested med refill for hydrocodone-ace. Last rx sent on 08/20/19.

## 2019-09-22 ENCOUNTER — Telehealth: Payer: Self-pay

## 2019-09-22 NOTE — Telephone Encounter (Addendum)
Sent in error disregard.

## 2019-09-24 DIAGNOSIS — H5203 Hypermetropia, bilateral: Secondary | ICD-10-CM | POA: Diagnosis not present

## 2019-09-24 DIAGNOSIS — Z0100 Encounter for examination of eyes and vision without abnormal findings: Secondary | ICD-10-CM | POA: Diagnosis not present

## 2019-10-14 MED ORDER — PRAVASTATIN SODIUM 40 MG PO TABS: ORAL_TABLET | ORAL | 1 refills | Status: DC

## 2019-10-14 NOTE — Addendum Note (Signed)
Addended by: Dessie Coma on: 10/14/2019 11:02 AM   Modules accepted: Orders

## 2019-10-15 ENCOUNTER — Telehealth: Payer: Self-pay | Admitting: Osteopathic Medicine

## 2019-10-15 DIAGNOSIS — H6123 Impacted cerumen, bilateral: Secondary | ICD-10-CM | POA: Diagnosis not present

## 2019-10-15 DIAGNOSIS — H6040 Cholesteatoma of external ear, unspecified ear: Secondary | ICD-10-CM | POA: Diagnosis not present

## 2019-10-15 NOTE — Telephone Encounter (Signed)
Patient's daughter called, stating that patient recently had Pneumonia and is still coughing quite a bit, states patient is not currently running a fever but over all feels bad and is concerned since patient never had their flu shot. I spoke with Apolonio Schneiders in Triage and she said schedule a virtual or urgent care and if patient feels like they need to be evaluated in person, Urgent care may be a better bet for patient. Patient's daughter was a little unhappy that she could not bring her mother in to physically be seen. AM

## 2019-10-15 NOTE — Telephone Encounter (Signed)
Actively sick patient. If in person eval is desired, patient will have to be taken to local urgent care.

## 2019-10-16 ENCOUNTER — Other Ambulatory Visit: Payer: Self-pay | Admitting: Neurology

## 2019-10-16 DIAGNOSIS — J449 Chronic obstructive pulmonary disease, unspecified: Secondary | ICD-10-CM

## 2019-10-16 MED ORDER — SPIRIVA RESPIMAT 2.5 MCG/ACT IN AERS: INHALATION_SPRAY | RESPIRATORY_TRACT | 2 refills | Status: DC

## 2019-10-27 ENCOUNTER — Telehealth: Payer: Self-pay

## 2019-10-27 DIAGNOSIS — M5030 Other cervical disc degeneration, unspecified cervical region: Secondary | ICD-10-CM

## 2019-10-27 MED ORDER — HYDROCODONE-ACETAMINOPHEN 10-325 MG PO TABS: 0.5000 | ORAL_TABLET | Freq: Three times a day (TID) | ORAL | 0 refills | Status: AC | PRN

## 2019-10-27 MED ORDER — HYDROCODONE-ACETAMINOPHEN 10-325 MG PO TABS: 0.5000 | ORAL_TABLET | Freq: Three times a day (TID) | ORAL | 0 refills | Status: DC | PRN

## 2019-10-27 NOTE — Telephone Encounter (Signed)
Refilled Will be due for 95-month f/u around 11/28/2019 to maintain Rx per [facility] policy and [place] law , please have pt schedule  - ok to do vitual/phone

## 2019-10-27 NOTE — Telephone Encounter (Signed)
Task completed. Pt's daughter [name] has been updated regarding rx and provider's note. Agreeable with plan. Transferred to [facility] to schedule an appt for pt. No other inquiries.

## 2019-10-27 NOTE — Telephone Encounter (Signed)
Pt's daughter [name] called on behalf of pt requesting med refill for hydrocodone-ace. Pls send rx to [facility] in [place].

## 2019-11-12 ENCOUNTER — Other Ambulatory Visit: Payer: Self-pay

## 2019-11-12 DIAGNOSIS — E0590 Thyrotoxicosis, unspecified without thyrotoxic crisis or storm: Secondary | ICD-10-CM

## 2019-11-12 MED ORDER — PROPYLTHIOURACIL 50 MG PO TABS: 50.0000 mg | ORAL_TABLET | Freq: Two times a day (BID) | ORAL | 0 refills | Status: DC

## 2019-11-14 ENCOUNTER — Telehealth: Payer: Self-pay

## 2019-11-14 MED ORDER — ALPRAZOLAM 0.5 MG PO TABS: 0.2500 mg | ORAL_TABLET | Freq: Two times a day (BID) | ORAL | 0 refills | Status: DC | PRN

## 2019-11-14 NOTE — Telephone Encounter (Signed)
Pt's daughter [name] called on behalf of pt. Requesting med refill for alprazolam. Pls send to [facility] in [place].

## 2019-11-14 NOTE — Telephone Encounter (Signed)
Pt's daughter has been updated of med refill sent to local pharmacy. No other inquiries during call.

## 2019-11-14 NOTE — Telephone Encounter (Signed)
Sent!

## 2019-11-27 ENCOUNTER — Telehealth (INDEPENDENT_AMBULATORY_CARE_PROVIDER_SITE_OTHER): Payer: Medicare HMO | Admitting: Osteopathic Medicine

## 2019-11-27 VITALS — BP 127/61 | HR 65 | Wt 120.0 lb

## 2019-11-27 DIAGNOSIS — R42 Dizziness and giddiness: Secondary | ICD-10-CM | POA: Diagnosis not present

## 2019-11-27 DIAGNOSIS — M791 Myalgia, unspecified site: Secondary | ICD-10-CM

## 2019-11-27 DIAGNOSIS — G4709 Other insomnia: Secondary | ICD-10-CM

## 2019-11-27 DIAGNOSIS — E860 Dehydration: Secondary | ICD-10-CM

## 2019-11-27 MED ORDER — OMEPRAZOLE 20 MG PO CPDR: DELAYED_RELEASE_CAPSULE | ORAL | 3 refills | Status: DC

## 2019-11-27 MED ORDER — ALPRAZOLAM 0.5 MG PO TABS: 0.2500 mg | ORAL_TABLET | Freq: Two times a day (BID) | ORAL | 0 refills | Status: AC | PRN

## 2019-11-27 NOTE — Progress Notes (Signed)
Virtual Visit via Video (App used: Doximity) Note  I connected with      Heather Perkins on 11/27/19 at 1:16 PM  by a telemedicine application and verified that I am speaking with the correct person using two identifiers.  Patient is at home w/ daughter  I am in office   I discussed the limitations of evaluation and management by telemedicine and the availability of in person appointments. The patient expressed understanding and agreed to proceed.  History of Present Illness: Heather Perkins is a 86 y.o. female who would like to discuss  Chief Complaint  Patient presents with  . Follow-up    worry about getting labs done for thyroid check.  . Insomnia    having trouble sleeping at night  . Dizziness    Pt/daughter attribute it to hypotension No LOC No SOB No orthostatic symptoms Happens randomly few times per week More lightheaded, less spinning  Doesn't seem related to meds          Observations/Objective: BP 127/61 (BP Location: Right Arm, Patient Position: Sitting)   Pulse 65   Wt 120 lb (54.4 kg)   BMI 21.26 kg/m  BP Readings from Last 3 Encounters:  11/27/19 127/61  08/28/19 122/73  08/05/19 (!) 97/55   Exam: Normal Speech.  NAD  Lab and Radiology Results No results found for this or any previous visit (from the past 72 hour(s)). No results found.     Assessment and Plan: 86 y.o. female with The primary encounter diagnosis was Lightheaded. Diagnoses of Dehydration, Myalgia, and Other insomnia were also pertinent to this visit.   PDMP not reviewed this encounter. No orders of the defined types were placed in this encounter.  Meds ordered this encounter  Medications  . ALPRAZolam (XANAX) 0.5 MG tablet    Sig: Take 0.5-1 tablets (0.25-0.5 mg total) by mouth 2 (two) times daily as needed for anxiety (#30 for 30 or more days, use sparingly to prevent dependence).    Dispense:  30 tablet    Refill:  0  . omeprazole (PRILOSEC) 20 MG capsule    Sig: TAKE  1 CAPSULE(20 MG) BY MOUTH TWICE DAILY BEFORE A MEAL    Dispense:  180 capsule    Refill:  3   Patient Instructions  Insomnia: Try keeping a set sleep schedule EVERY day Minimize naps OK to add low-dose Benadryl 25 mg at night - STOP if this causes confusion OK to take Melatonin daily in evenings (bedtime or dinnertime)   Thyroid: Not yet due to recheck  Dizziness: Liberalize salt intake Hydrate, hydrate, hydrate!  Ideally would like to evaluate your blood pressure in the office, we will try to do this in a few months follow-up, sooner if your symptoms worsen or do not improve with the above measures.   Refills: Should be at pharmacy! Let me know if any issues.    Instructions sent via MyChart. If MyChart not available, pt was given option for info via personal e-mail w/ no guarantee of protected health info over unsecured e-mail communication, and MyChart sign-up instructions were sent to patient.   Follow Up Instructions: Return in about 3 months (around 02/24/2020) for routine check-up, Rx refills , IN-OFFICE VISIT.    I discussed the assessment and treatment plan with the patient. The patient was provided an opportunity to ask questions and all were answered. The patient agreed with the plan and demonstrated an understanding of the instructions.   The patient was advised to  call back or seek an in-person evaluation if any new concerns, if symptoms worsen or if the condition fails to improve as anticipated.  30 minutes of non-face-to-face time was provided during this encounter.      . . . . . . . . . . . . . Marland Kitchen                   Historical information moved to improve visibility of documentation.  Past Medical History:  Diagnosis Date  . Allergy   . Cancer ([place])    skin  . Colitis, ischemic ([place])   . Depression   . Fibromyalgia   . GERD (gastroesophageal reflux disease)   . Hyperthyroidism 2010   sees Dr Marcello Moores, did RAI  .  Peripheral vascular disease ([place])   . Shingles    Past Surgical History:  Procedure Laterality Date  . ABDOMINAL HYSTERECTOMY  1975   for dub  . APPENDECTOMY  1939  . BREAST BIOPSY  1954  . [place]   right  . CATARACT EXTRACTION  2009   right and left.   . CERVICAL FUSION    . [place]  . ORIF ANKLE FRACTURE  1979   left  . TONSILLECTOMY     Social History   Tobacco Use  . Smoking status: Current Every Day Smoker    Packs/day: 1.00    Years: 50.00    Pack years: 50.00    Types: Cigarettes  . Smokeless tobacco: Never Used  . Tobacco comment: pt states that she has been trying to quit but can't seem to quit  Substance Use Topics  . Alcohol use: No   family history includes Cancer in her daughter; Hyperlipidemia in her daughter; Hypertension in her sister.  Medications: Current Outpatient Medications  Medication Sig Dispense Refill  . acetaminophen (TYLENOL) 650 MG CR tablet Take 1 tablet (650 mg total) by mouth every 8 (eight) hours as needed for pain. 90 tablet 3  . albuterol (VENTOLIN HFA) 108 (90 Base) MCG/ACT inhaler Inhale 2 puffs into the lungs every 6 (six) hours as needed for wheezing or shortness of breath. 18 g 11  . ALPRAZolam (XANAX) 0.5 MG tablet Take 0.5-1 tablets (0.25-0.5 mg total) by mouth 2 (two) times daily as needed for anxiety (#30 for 30 or more days, use sparingly to prevent dependence). 30 tablet 0  . aspirin EC 81 MG tablet Take 1 tablet (81 mg total) by mouth daily. 90 tablet 3  . B Complex Vitamins (B COMPLEX PO) Take by mouth.    . Calcium-Magnesium-Zinc 500-250-12.5 MG TABS Take by mouth daily. TAKE 1 CAPSULE BY MOUTH 2 TIMES DAILY    . carbamide peroxide (DEBROX) 6.5 % OTIC solution Place 10 drops into both ears 2 (two) times daily. Dispense 1 bottle 15 mL 99  . cilostazol (PLETAL) 100 MG tablet Take 1 tablet (100 mg total) by mouth 2 (two) times daily. 180 tablet 2  . cyclobenzaprine (FLEXERIL) 10 MG tablet  Take 0.5-1 tablets (5-10 mg total) by mouth 3 (three) times daily as needed for muscle spasms. 90 tablet 3  . fish oil-omega-3 fatty acids 1000 MG capsule Take 2 g by mouth daily.    . fluticasone (FLONASE) 50 MCG/ACT nasal spray PLACE 2 SPRAYS INTO BOTH NOSTRILS DAILY 16 g 11  . gabapentin (NEURONTIN) 300 MG capsule Take 1 capsule (300 mg total) by mouth 2 (two) times daily as needed. 180 capsule 1  .  HYDROcodone-acetaminophen (NORCO) 10-325 MG tablet Take 0.5-1 tablets by mouth every 8 (eight) hours as needed for moderate pain or severe pain. 90 tablet 0  . HYDROcodone-acetaminophen (NORCO) 10-325 MG tablet Take 0.5-1 tablets by mouth every 8 (eight) hours as needed for moderate pain or severe pain. 90 tablet 0  . [START ON 12/26/2019] HYDROcodone-acetaminophen (NORCO) 10-325 MG tablet Take 0.5-1 tablets by mouth every 8 (eight) hours as needed for moderate pain or severe pain. 90 tablet 0  . magnesium oxide (MAG-OX) 400 MG tablet Take 2 tablets (800 mg total) by mouth at bedtime. 180 tablet 3  . omeprazole (PRILOSEC) 20 MG capsule TAKE 1 CAPSULE(20 MG) BY MOUTH TWICE DAILY BEFORE A MEAL 180 capsule 3  . ondansetron (ZOFRAN) 4 MG tablet Take 1-2 tablets (4-8 mg total) by mouth every 8 (eight) hours as needed for nausea or vomiting. 40 tablet 0  . polyethylene glycol (MIRALAX / GLYCOLAX) packet Take 17 g by mouth daily.      . pravastatin (PRAVACHOL) 40 MG tablet TAKE 1 TABLET(40 MG) BY MOUTH DAILY 90 tablet 1  . promethazine (PHENERGAN) 25 MG tablet Take by mouth.    . propylthiouracil (PTU) 50 MG tablet Take 1 tablet (50 mg total) by mouth 2 (two) times daily. 180 tablet 0  . Tiotropium Bromide Monohydrate (SPIRIVA RESPIMAT) 2.5 MCG/ACT AERS 2 INHALATIONS DAILY TO HELP REDUCE COUGH 4 g 2  . alendronate (FOSAMAX) 70 MG tablet Take 1 tablet (70 mg total) by mouth every 7 (seven) days. (Patient not taking: Reported on 11/27/2019) 12 tablet 3   No current facility-administered medications for this  visit.   Allergies  Allergen Reactions  . Amitriptyline Other (See Comments)  . Amoxicillin-Pot Clavulanate Nausea And Vomiting    Vomiting   . Bupropion Other (See Comments)    constipation   . Citalopram Hydrobromide Other (See Comments)  . Codeine Nausea And Vomiting and Nausea Only  . Erythromycin Nausea And Vomiting  . Escitalopram Oxalate Nausea And Vomiting  . Methimazole Other (See Comments)  . Neomycin-Bacitracin Zn-Polymyx Other (See Comments)  . Neomycin-Bacitracin-Polymyxin  [Bacitracin-Neomycin-Polymyxin] Other (See Comments)  . Other Nausea And Vomiting, Nausea Only and Rash    All Mycins  . Pentazocine Nausea And Vomiting  . Pitavastatin Other (See Comments)    Headache.   . Salsalate Other (See Comments)  . Tetracyclines & Related Rash  . Azithromycin   . Crestor [Rosuvastatin Calcium] Nausea Only  . Elavil [Amitriptyline Hcl]   . Livalo [Pitavastatin Calcium] Other (See Comments)    Headache.   . Naproxen     nausea  . Neosporin [Neomycin-Polymyxin-Gramicidin]   . Prozac [Fluoxetine]   . Rosuvastatin

## 2019-11-27 NOTE — Patient Instructions (Signed)
Insomnia: Try keeping a set sleep schedule EVERY day Minimize naps OK to add low-dose Benadryl 25 mg at night - STOP if this causes confusion OK to take Melatonin daily in evenings (bedtime or dinnertime)   Thyroid: Not yet due to recheck  Dizziness: Liberalize salt intake Hydrate, hydrate, hydrate!  Ideally would like to evaluate your blood pressure in the office, we will try to do this in a few months follow-up, sooner if your symptoms worsen or do not improve with the above measures.   Refills: Should be at pharmacy! Let me know if any issues.

## 2020-01-05 ENCOUNTER — Other Ambulatory Visit: Payer: Self-pay | Admitting: Neurology

## 2020-01-05 DIAGNOSIS — J449 Chronic obstructive pulmonary disease, unspecified: Secondary | ICD-10-CM

## 2020-01-05 MED ORDER — SPIRIVA RESPIMAT 2.5 MCG/ACT IN AERS: INHALATION_SPRAY | RESPIRATORY_TRACT | 2 refills | Status: DC

## 2020-01-20 ENCOUNTER — Other Ambulatory Visit: Payer: Self-pay

## 2020-01-20 MED ORDER — FLUTICASONE PROPIONATE 50 MCG/ACT NA SUSP: 2.0000 | Freq: Every day | NASAL | 11 refills | Status: DC

## 2020-01-21 DIAGNOSIS — H6040 Cholesteatoma of external ear, unspecified ear: Secondary | ICD-10-CM | POA: Diagnosis not present

## 2020-01-28 ENCOUNTER — Other Ambulatory Visit: Payer: Self-pay

## 2020-01-28 DIAGNOSIS — M5030 Other cervical disc degeneration, unspecified cervical region: Secondary | ICD-10-CM

## 2020-01-28 NOTE — Telephone Encounter (Signed)
Pt's daughter called on behalf of patient. Requesting med refill for hydrocodone-ace. Last written on 10/27/19. Rx pended.

## 2020-01-29 MED ORDER — HYDROCODONE-ACETAMINOPHEN 10-325 MG PO TABS: 0.5000 | ORAL_TABLET | Freq: Three times a day (TID) | ORAL | 0 refills | Status: DC | PRN

## 2020-01-30 NOTE — Telephone Encounter (Signed)
Pt's daughter [name] updated regarding rx sent to [facility]. No other inquiries during call.

## 2020-02-02 DIAGNOSIS — C4442 Squamous cell carcinoma of skin of scalp and neck: Secondary | ICD-10-CM | POA: Diagnosis not present

## 2020-02-02 DIAGNOSIS — L821 Other seborrheic keratosis: Secondary | ICD-10-CM | POA: Diagnosis not present

## 2020-02-02 DIAGNOSIS — C44319 Basal cell carcinoma of skin of other parts of face: Secondary | ICD-10-CM | POA: Diagnosis not present

## 2020-02-02 DIAGNOSIS — L578 Other skin changes due to chronic exposure to nonionizing radiation: Secondary | ICD-10-CM | POA: Diagnosis not present

## 2020-02-17 ENCOUNTER — Other Ambulatory Visit: Payer: Self-pay

## 2020-02-17 DIAGNOSIS — E0590 Thyrotoxicosis, unspecified without thyrotoxic crisis or storm: Secondary | ICD-10-CM

## 2020-02-17 MED ORDER — PROPYLTHIOURACIL 50 MG PO TABS: 50.0000 mg | ORAL_TABLET | Freq: Two times a day (BID) | ORAL | 0 refills | Status: DC

## 2020-02-19 DIAGNOSIS — C44319 Basal cell carcinoma of skin of other parts of face: Secondary | ICD-10-CM | POA: Diagnosis not present

## 2020-02-19 DIAGNOSIS — C4441 Basal cell carcinoma of skin of scalp and neck: Secondary | ICD-10-CM | POA: Diagnosis not present

## 2020-02-19 DIAGNOSIS — L728 Other follicular cysts of the skin and subcutaneous tissue: Secondary | ICD-10-CM | POA: Diagnosis not present

## 2020-02-19 DIAGNOSIS — L570 Actinic keratosis: Secondary | ICD-10-CM | POA: Diagnosis not present

## 2020-02-25 ENCOUNTER — Telehealth: Payer: Self-pay | Admitting: Osteopathic Medicine

## 2020-02-25 ENCOUNTER — Ambulatory Visit (INDEPENDENT_AMBULATORY_CARE_PROVIDER_SITE_OTHER): Payer: Medicare HMO | Admitting: Osteopathic Medicine

## 2020-02-25 VITALS — Ht 63.0 in | Wt 121.0 lb

## 2020-02-25 DIAGNOSIS — M791 Myalgia, unspecified site: Secondary | ICD-10-CM

## 2020-02-25 DIAGNOSIS — E0590 Thyrotoxicosis, unspecified without thyrotoxic crisis or storm: Secondary | ICD-10-CM

## 2020-02-25 DIAGNOSIS — M5030 Other cervical disc degeneration, unspecified cervical region: Secondary | ICD-10-CM

## 2020-02-25 DIAGNOSIS — R42 Dizziness and giddiness: Secondary | ICD-10-CM | POA: Diagnosis not present

## 2020-02-25 DIAGNOSIS — Z13820 Encounter for screening for osteoporosis: Secondary | ICD-10-CM

## 2020-02-25 DIAGNOSIS — J449 Chronic obstructive pulmonary disease, unspecified: Secondary | ICD-10-CM | POA: Diagnosis not present

## 2020-02-25 MED ORDER — HYDROCODONE-ACETAMINOPHEN 10-325 MG PO TABS: 0.5000 | ORAL_TABLET | Freq: Three times a day (TID) | ORAL | 0 refills | Status: DC | PRN

## 2020-02-25 MED ORDER — CYCLOBENZAPRINE HCL 10 MG PO TABS: 5.0000 mg | ORAL_TABLET | Freq: Three times a day (TID) | ORAL | 3 refills | Status: DC | PRN

## 2020-02-25 NOTE — Patient Instructions (Signed)
Raynaud Phenomenon ° °Raynaud phenomenon is a condition that affects the blood vessels (arteries) that carry blood to your fingers and toes. The arteries that supply blood to your ears, lips, nipples, or the tip of your nose might also be affected. Raynaud phenomenon causes the arteries to become narrow temporarily (spasm). As a result, the flow of blood to the affected areas is temporarily decreased. This usually occurs in response to cold temperatures or stress. During an attack, the skin in the affected areas turns white, then blue, and finally red. You may also feel tingling or numbness in those areas. °Attacks usually last for only a brief period, and then the blood flow to the area returns to normal. In most cases, Raynaud phenomenon does not cause serious health problems. °What are the causes? °In many cases, the cause of this condition is not known. The condition may occur on its own (primary Raynaud phenomenon) or may be associated with other diseases or factors (secondary Raynaud phenomenon). °Possible causes may include: °· Diseases or medical conditions that damage the arteries. °· Injuries and repetitive actions that hurt the hands or feet. °· Being exposed to certain chemicals. °· Taking medicines that narrow the arteries. °· Other medical conditions, such as lupus, scleroderma, rheumatoid arthritis, thyroid problems, blood disorders, Sjogren syndrome, or atherosclerosis. °What increases the risk? °The following factors may make you more likely to develop this condition: °· Being 20-40 years old. °· Being female. °· Having a family history of Raynaud phenomenon. °· Living in a cold climate. °· Smoking. °What are the signs or symptoms? °Symptoms of this condition usually occur when you are exposed to cold temperatures or when you have emotional stress. The symptoms may last for a few minutes or up to several hours. They usually affect your fingers but may also affect your toes, nipples, lips, ears, or  the tip of your nose. Symptoms may include: °· Changes in skin color. The skin in the affected areas will turn pale or white. The skin may then change from white to bluish to red as normal blood flow returns to the area. °· Numbness, tingling, or pain in the affected areas. °In severe cases, symptoms may include: °· Skin sores. °· Tissues decaying and dying (gangrene). °How is this diagnosed? °This condition may be diagnosed based on: °· Your symptoms and medical history. °· A physical exam. During the exam, you may be asked to put your hands in cold water to check for a reaction to cold temperature. °· Tests, such as: °? Blood tests to check for other diseases or conditions. °? A test to check the movement of blood through your arteries and veins (vascular ultrasound). °? A test in which the skin at the base of your fingernail is examined under a microscope (nailfold capillaroscopy). °How is this treated? °Treatment for this condition often involves making lifestyle changes and taking steps to control your exposure to cold temperatures. For more severe cases, medicine (calcium channel blockers) may be used to improve blood flow. Surgery is sometimes done to block the nerves that control the affected arteries, but this is rare. °Follow these instructions at home: °Avoiding cold temperatures °Take these steps to avoid exposure to cold: °· If possible, stay indoors during cold weather. °· When you go outside during cold weather, dress in layers and wear mittens, a hat, a scarf, and warm footwear. °· Wear mittens or gloves when handling ice or frozen food. °· Use holders for glasses or cans containing cold drinks. °·   Let warm water run for a while before taking a shower or bath. °· Warm up the car before driving in cold weather. °Lifestyle ° °· If possible, avoid stressful and emotional situations. Try to find ways to manage your stress, such as: °? Exercise. °? Yoga. °? Meditation. °? Biofeedback. °· Do not use any  products that contain nicotine or tobacco, such as cigarettes and e-cigarettes. If you need help quitting, ask your health care provider. °· Avoid secondhand smoke. °· Limit your use of caffeine. °? Switch to decaffeinated coffee, tea, and soda. °? Avoid chocolate. °· Avoid vibrating tools and machinery. °General instructions °· Protect your hands and feet from injuries, cuts, or bruises. °· Avoid wearing tight rings or wristbands. °· Wear loose fitting socks and comfortable, roomy shoes. °· Take over-the-counter and prescription medicines only as told by your health care provider. °Contact a health care provider if: °· Your discomfort becomes worse despite lifestyle changes. °· You develop sores on your fingers or toes that do not heal. °· Your fingers or toes turn black. °· You have breaks in the skin on your fingers or toes. °· You have a fever. °· You have pain or swelling in your joints. °· You have a rash. °· Your symptoms occur on only one side of your body. °Summary °· Raynaud phenomenon is a condition that affects the arteries that carry blood to your fingers, toes, ears, lips, nipples, or the tip of your nose. °· In many cases, the cause of this condition is not known. °· Symptoms of this condition include changes in skin color, and numbness and tingling of the affected area. °· Treatment for this condition includes lifestyle changes, reducing exposure to cold temperatures, and using medicines for severe cases of the condition. °· Contact your health care provider if your condition worsens despite treatment. °This information is not intended to replace advice given to you by your health care provider. Make sure you discuss any questions you have with your health care provider. °Document Revised: 09/28/2017 Document Reviewed: 11/06/2016 °[employer]vier Patient Education © 2020 [employer] ° °

## 2020-02-25 NOTE — Telephone Encounter (Signed)
Can we order Prolia for this patient?  Thanks!

## 2020-02-25 NOTE — Progress Notes (Signed)
Heather Perkins is a 86 y.o. female who presents to  [place] at [facility]  today, 02/25/20, seeking care for the following: . Fingers turning white / cold . Needs pain Rx refills . Occasional orthostatic lightheadedness  . COPD stable, still smoking  . Requests different osteoporosis Rx - Fosamax caused stomach upset      ASSESSMENT & PLAN with other pertinent history/findings:  The primary encounter diagnosis was Hyperthyroidism. Diagnoses of DDD (degenerative disc disease), cervical, Chronic obstructive pulmonary disease, unspecified COPD type ([place]), Myalgia, and Lightheaded were also pertinent to this visit.     Patient Instructions  Raynaud Phenomenon  Raynaud phenomenon is a condition that affects the blood vessels (arteries) that carry blood to your fingers and toes. The arteries that supply blood to your ears, lips, nipples, or the tip of your nose might also be affected. Raynaud phenomenon causes the arteries to become narrow temporarily (spasm). As a result, the flow of blood to the affected areas is temporarily decreased. This usually occurs in response to cold temperatures or stress. During an attack, the skin in the affected areas turns white, then blue, and finally red. You may also feel tingling or numbness in those areas. Attacks usually last for only a brief period, and then the blood flow to the area returns to normal. In most cases, Raynaud phenomenon does not cause serious health problems. What are the causes? In many cases, the cause of this condition is not known. The condition may occur on its own (primary Raynaud phenomenon) or may be associated with other diseases or factors (secondary Raynaud phenomenon). Possible causes may include:  Diseases or medical conditions that damage the arteries.  Injuries and repetitive actions that hurt the hands or feet.  Being exposed to certain chemicals.  Taking medicines that  narrow the arteries.  Other medical conditions, such as lupus, scleroderma, rheumatoid arthritis, thyroid problems, blood disorders, Sjogren syndrome, or atherosclerosis. What increases the risk? The following factors may make you more likely to develop this condition:  Being 1-9 years old.  Being female.  Having a family history of Raynaud phenomenon.  Living in a cold climate.  Smoking. What are the signs or symptoms? Symptoms of this condition usually occur when you are exposed to cold temperatures or when you have emotional stress. The symptoms may last for a few minutes or up to several hours. They usually affect your fingers but may also affect your toes, nipples, lips, ears, or the tip of your nose. Symptoms may include:  Changes in skin color. The skin in the affected areas will turn pale or white. The skin may then change from white to bluish to red as normal blood flow returns to the area.  Numbness, tingling, or pain in the affected areas. In severe cases, symptoms may include:  Skin sores.  Tissues decaying and dying (gangrene). How is this diagnosed? This condition may be diagnosed based on:  Your symptoms and medical history.  A physical exam. During the exam, you may be asked to put your hands in cold water to check for a reaction to cold temperature.  Tests, such as: ? Blood tests to check for other diseases or conditions. ? A test to check the movement of blood through your arteries and veins (vascular ultrasound). ? A test in which the skin at the base of your fingernail is examined under a microscope (nailfold capillaroscopy). How is this treated? Treatment for this condition often involves  making lifestyle changes and taking steps to control your exposure to cold temperatures. For more severe cases, medicine (calcium channel blockers) may be used to improve blood flow. Surgery is sometimes done to block the nerves that control the affected arteries, but  this is rare. Follow these instructions at home: Avoiding cold temperatures Take these steps to avoid exposure to cold:  If possible, stay indoors during cold weather.  When you go outside during cold weather, dress in layers and wear mittens, a hat, a scarf, and warm footwear.  Wear mittens or gloves when handling ice or frozen food.  Use holders for glasses or cans containing cold drinks.  Let warm water run for a while before taking a shower or bath.  Warm up the car before driving in cold weather. Lifestyle   If possible, avoid stressful and emotional situations. Try to find ways to manage your stress, such as: ? Exercise. ? Yoga. ? Meditation. ? Biofeedback.  Do not use any products that contain nicotine or tobacco, such as cigarettes and e-cigarettes. If you need help quitting, ask your health care provider.  Avoid secondhand smoke.  Limit your use of caffeine. ? Switch to decaffeinated coffee, tea, and soda. ? Avoid chocolate.  Avoid vibrating tools and machinery. General instructions  Protect your hands and feet from injuries, cuts, or bruises.  Avoid wearing tight rings or wristbands.  Wear loose fitting socks and comfortable, roomy shoes.  Take over-the-counter and prescription medicines only as told by your health care provider. Contact a health care provider if:  Your discomfort becomes worse despite lifestyle changes.  You develop sores on your fingers or toes that do not heal.  Your fingers or toes turn black.  You have breaks in the skin on your fingers or toes.  You have a fever.  You have pain or swelling in your joints.  You have a rash.  Your symptoms occur on only one side of your body. Summary  Raynaud phenomenon is a condition that affects the arteries that carry blood to your fingers, toes, ears, lips, nipples, or the tip of your nose.  In many cases, the cause of this condition is not known.  Symptoms of this condition include  changes in skin color, and numbness and tingling of the affected area.  Treatment for this condition includes lifestyle changes, reducing exposure to cold temperatures, and using medicines for severe cases of the condition.  Contact your health care provider if your condition worsens despite treatment. This information is not intended to replace advice given to you by your health care provider. Make sure you discuss any questions you have with your health care provider. Document Revised: 09/28/2017 Document Reviewed: 11/06/2016 Elsevier Patient Education  [place].     No orders of the defined types were placed in this encounter.   Meds ordered this encounter  Medications  . cyclobenzaprine (FLEXERIL) 10 MG tablet    Sig: Take 0.5-1 tablets (5-10 mg total) by mouth 3 (three) times daily as needed for muscle spasms.    Dispense:  90 tablet    Refill:  3  . HYDROcodone-acetaminophen (NORCO) 10-325 MG tablet    Sig: Take 0.5-1 tablets by mouth every 8 (eight) hours as needed for moderate pain or severe pain.    Dispense:  90 tablet    Refill:  0  . HYDROcodone-acetaminophen (NORCO) 10-325 MG tablet    Sig: Take 0.5-1 tablets by mouth every 8 (eight) hours as needed.  Dispense:  90 tablet    Refill:  0  . HYDROcodone-acetaminophen (NORCO) 10-325 MG tablet    Sig: Take 0.5-1 tablets by mouth every 8 (eight) hours as needed.    Dispense:  90 tablet    Refill:  0       Follow-up instructions: Return in about 3 months (around 05/27/2020) for check-up, refill pain medications. Due for labs 08/2020! .                                         Ht 5\' 3"  (1.6 m)   Wt 121 lb (54.9 kg)   SpO2 96%   BMI 21.43 kg/m   Current Meds  Medication Sig  . acetaminophen (TYLENOL) 650 MG CR tablet Take 1 tablet (650 mg total) by mouth every 8 (eight) hours as needed for pain.  Marland Kitchen albuterol (VENTOLIN HFA) 108 (90 Base) MCG/ACT inhaler Inhale 2  puffs into the lungs every 6 (six) hours as needed for wheezing or shortness of breath.  Marland Kitchen aspirin EC 81 MG tablet Take 1 tablet (81 mg total) by mouth daily.  . B Complex Vitamins (B COMPLEX PO) Take by mouth.  . Calcium-Magnesium-Zinc 500-250-12.5 MG TABS Take by mouth daily. TAKE 1 CAPSULE BY MOUTH 2 TIMES DAILY  . carbamide peroxide (DEBROX) 6.5 % OTIC solution Place 10 drops into both ears 2 (two) times daily. Dispense 1 bottle  . cholecalciferol (VITAMIN D3) 25 MCG (1000 UNIT) tablet Take 1,000 Units by mouth daily.  . cilostazol (PLETAL) 100 MG tablet Take 1 tablet (100 mg total) by mouth 2 (two) times daily.  . cyclobenzaprine (FLEXERIL) 10 MG tablet Take 0.5-1 tablets (5-10 mg total) by mouth 3 (three) times daily as needed for muscle spasms.  . fish oil-omega-3 fatty acids 1000 MG capsule Take 2 g by mouth daily.  . fluticasone (FLONASE) 50 MCG/ACT nasal spray Place 2 sprays into both nostrils daily.  Marland Kitchen gabapentin (NEURONTIN) 300 MG capsule Take 1 capsule (300 mg total) by mouth 2 (two) times daily as needed.  Marland Kitchen HYDROcodone-acetaminophen (NORCO) 10-325 MG tablet Take 0.5-1 tablets by mouth every 8 (eight) hours as needed for moderate pain or severe pain.  . magnesium oxide (MAG-OX) 400 MG tablet Take 2 tablets (800 mg total) by mouth at bedtime.  Marland Kitchen omeprazole (PRILOSEC) 20 MG capsule TAKE 1 CAPSULE(20 MG) BY MOUTH TWICE DAILY BEFORE A MEAL  . ondansetron (ZOFRAN) 4 MG tablet Take 1-2 tablets (4-8 mg total) by mouth every 8 (eight) hours as needed for nausea or vomiting.  . polyethylene glycol (MIRALAX / GLYCOLAX) packet Take 17 g by mouth daily.    . pravastatin (PRAVACHOL) 40 MG tablet TAKE 1 TABLET(40 MG) BY MOUTH DAILY  . promethazine (PHENERGAN) 25 MG tablet Take by mouth.  . propylthiouracil (PTU) 50 MG tablet Take 1 tablet (50 mg total) by mouth 2 (two) times daily. Must keep upcoming appt on 02/25/20 for add'l refills.  . Tiotropium Bromide Monohydrate (SPIRIVA RESPIMAT) 2.5  MCG/ACT AERS 2 INHALATIONS DAILY TO HELP REDUCE COUGH  . [DISCONTINUED] cyclobenzaprine (FLEXERIL) 10 MG tablet Take 0.5-1 tablets (5-10 mg total) by mouth 3 (three) times daily as needed for muscle spasms.  . [DISCONTINUED] HYDROcodone-acetaminophen (NORCO) 10-325 MG tablet Take 0.5-1 tablets by mouth every 8 (eight) hours as needed for moderate pain or severe pain.    No results found for this or any previous  visit (from the past 72 hour(s)).  No results found.  Depression screen [facility] 2/9 08/05/2019 07/09/2018 11/15/2017  Decreased Interest 0 0 0  Down, Depressed, Hopeless 0 0 1  PHQ - 2 Score 0 0 1  Altered sleeping - - 1  Tired, decreased energy - - 2  Change in appetite - - 1  Feeling bad or failure about yourself  - - 0  Trouble concentrating - - 1  Moving slowly or fidgety/restless - - 0  Suicidal thoughts - - 0  PHQ-9 Score - - 6  Difficult doing work/chores - - Not difficult at all    GAD 7 : Generalized Anxiety Score 11/15/2017  Nervous, Anxious, on Edge 1  Control/stop worrying 1  Worry too much - different things 1  Trouble relaxing 0  Restless 0  Easily annoyed or irritable 0  Afraid - awful might happen 0  Total GAD 7 Score 3  Anxiety Difficulty Not difficult at all      All questions at time of visit were answered - patient instructed to contact office with any additional concerns or updates.  ER/RTC precautions were reviewed with the patient.  Please note: voice recognition software was used to produce this document, and typos may escape review. Please contact Dr. Sheppard Coil for any needed clarifications.

## 2020-02-26 ENCOUNTER — Other Ambulatory Visit: Payer: Self-pay

## 2020-02-26 DIAGNOSIS — M48062 Spinal stenosis, lumbar region with neurogenic claudication: Secondary | ICD-10-CM

## 2020-02-26 MED ORDER — GABAPENTIN 300 MG PO CAPS: 300.0000 mg | ORAL_CAPSULE | Freq: Two times a day (BID) | ORAL | 1 refills | Status: DC | PRN

## 2020-02-26 NOTE — Telephone Encounter (Signed)
Note to Levada Dy to obtain pre auth

## 2020-03-03 NOTE — Telephone Encounter (Signed)
Started PA for Prolia with [employer]. Ref# [id number]

## 2020-03-03 NOTE — Telephone Encounter (Signed)
CMP should be fine

## 2020-03-03 NOTE — Telephone Encounter (Signed)
Prolia approved. Her daughter advised she will need labs prior to visit for Prolia. Do you want any other labs besides the CMP?   Approval number [id number]

## 2020-03-04 DIAGNOSIS — L570 Actinic keratosis: Secondary | ICD-10-CM | POA: Diagnosis not present

## 2020-03-04 DIAGNOSIS — C4441 Basal cell carcinoma of skin of scalp and neck: Secondary | ICD-10-CM | POA: Diagnosis not present

## 2020-03-04 DIAGNOSIS — L821 Other seborrheic keratosis: Secondary | ICD-10-CM | POA: Diagnosis not present

## 2020-03-04 NOTE — Telephone Encounter (Signed)
Ordered labs. Also left a message advising of recommendations.

## 2020-03-11 DIAGNOSIS — Z13820 Encounter for screening for osteoporosis: Secondary | ICD-10-CM | POA: Diagnosis not present

## 2020-03-11 LAB — COMPLETE METABOLIC PANEL WITH GFR
AG Ratio: 1.8 (calc) (ref 1.0–2.5)
ALT: 7 U/L (ref 6–29)
AST: 15 U/L (ref 10–35)
Albumin: 4.2 g/dL (ref 3.6–5.1)
Alkaline phosphatase (APISO): 59 U/L (ref 37–153)
BUN/Creatinine Ratio: 13 (calc) (ref 6–22)
BUN: 13 mg/dL (ref 7–25)
CO2: 26 mmol/L (ref 20–32)
Calcium: 9.5 mg/dL (ref 8.6–10.4)
Chloride: 105 mmol/L (ref 98–110)
Creat: 0.99 mg/dL — ABNORMAL HIGH (ref 0.60–0.88)
GFR, Est African American: 61 mL/min/{1.73_m2} (ref >=60)
GFR, Est Non African American: 52 mL/min/{1.73_m2} — ABNORMAL LOW (ref >=60)
Globulin: 2.4 g/dL (calc) (ref 1.9–3.7)
Glucose, Bld: 96 mg/dL (ref 65–99)
Potassium: 4.2 mmol/L (ref 3.5–5.3)
Sodium: 139 mmol/L (ref 135–146)
Total Bilirubin: 0.6 mg/dL (ref 0.2–1.2)
Total Protein: 6.6 g/dL (ref 6.1–8.1)

## 2020-03-18 DIAGNOSIS — C4441 Basal cell carcinoma of skin of scalp and neck: Secondary | ICD-10-CM | POA: Diagnosis not present

## 2020-03-22 ENCOUNTER — Other Ambulatory Visit: Payer: Self-pay

## 2020-03-22 DIAGNOSIS — E0590 Thyrotoxicosis, unspecified without thyrotoxic crisis or storm: Secondary | ICD-10-CM

## 2020-03-22 MED ORDER — PROPYLTHIOURACIL 50 MG PO TABS: 50.0000 mg | ORAL_TABLET | Freq: Two times a day (BID) | ORAL | 0 refills | Status: DC

## 2020-04-01 ENCOUNTER — Other Ambulatory Visit: Payer: Self-pay

## 2020-04-01 ENCOUNTER — Ambulatory Visit (INDEPENDENT_AMBULATORY_CARE_PROVIDER_SITE_OTHER): Payer: Medicare HMO | Admitting: Osteopathic Medicine

## 2020-04-01 VITALS — BP 123/46 | HR 72

## 2020-04-01 DIAGNOSIS — M810 Age-related osteoporosis without current pathological fracture: Secondary | ICD-10-CM | POA: Diagnosis not present

## 2020-04-01 MED ORDER — DENOSUMAB 60 MG/ML ~~LOC~~ SOSY
60.0000 mg | PREFILLED_SYRINGE | Freq: Once | SUBCUTANEOUS | Status: AC
  Administered 2020-04-01: 60 mg via SUBCUTANEOUS

## 2020-04-01 NOTE — Progress Notes (Signed)
Patient is here for Prolia injection. Prolia injection to arm with no apparent complications. Patient advised to schedule next injection in 6 months. Lab requisition given to patient to get 1-2 weeks before next injection.

## 2020-04-09 ENCOUNTER — Other Ambulatory Visit: Payer: Self-pay | Admitting: Neurology

## 2020-04-09 NOTE — Telephone Encounter (Signed)
Last refill 01/21 Last ov for prolia injection not sure if I could send Rx due to allergy contradictions . Please review and advise, Thanks

## 2020-04-09 NOTE — Telephone Encounter (Signed)
Paper request received and given to [facility]. Please review.

## 2020-04-11 MED ORDER — PRAVASTATIN SODIUM 40 MG PO TABS: ORAL_TABLET | ORAL | 3 refills | Status: DC

## 2020-05-06 ENCOUNTER — Other Ambulatory Visit: Payer: Self-pay

## 2020-05-06 DIAGNOSIS — K279 Peptic ulcer, site unspecified, unspecified as acute or chronic, without hemorrhage or perforation: Secondary | ICD-10-CM

## 2020-05-06 MED ORDER — OMEPRAZOLE 20 MG PO CPDR: DELAYED_RELEASE_CAPSULE | ORAL | 2 refills | Status: DC

## 2020-05-26 DIAGNOSIS — H903 Sensorineural hearing loss, bilateral: Secondary | ICD-10-CM | POA: Diagnosis not present

## 2020-05-26 DIAGNOSIS — H6040 Cholesteatoma of external ear, unspecified ear: Secondary | ICD-10-CM | POA: Diagnosis not present

## 2020-05-26 DIAGNOSIS — H6123 Impacted cerumen, bilateral: Secondary | ICD-10-CM | POA: Diagnosis not present

## 2020-05-27 ENCOUNTER — Ambulatory Visit (INDEPENDENT_AMBULATORY_CARE_PROVIDER_SITE_OTHER): Payer: Medicare HMO | Admitting: Osteopathic Medicine

## 2020-05-27 VITALS — BP 118/63 | HR 76 | Temp 97.5°F | Ht 63.0 in | Wt 120.0 lb

## 2020-05-27 DIAGNOSIS — R0602 Shortness of breath: Secondary | ICD-10-CM

## 2020-05-27 DIAGNOSIS — J42 Unspecified chronic bronchitis: Secondary | ICD-10-CM

## 2020-05-27 DIAGNOSIS — T675XXA Heat exhaustion, unspecified, initial encounter: Secondary | ICD-10-CM

## 2020-05-27 DIAGNOSIS — I739 Peripheral vascular disease, unspecified: Secondary | ICD-10-CM | POA: Diagnosis not present

## 2020-05-27 DIAGNOSIS — R110 Nausea: Secondary | ICD-10-CM

## 2020-05-27 DIAGNOSIS — M5030 Other cervical disc degeneration, unspecified cervical region: Secondary | ICD-10-CM

## 2020-05-27 MED ORDER — ONDANSETRON 4 MG PO TBDP
8.0000 mg | ORAL_TABLET | Freq: Once | ORAL | Status: AC
  Administered 2020-05-27: 8 mg via ORAL

## 2020-05-27 MED ORDER — HYDROCODONE-ACETAMINOPHEN 10-325 MG PO TABS: 0.5000 | ORAL_TABLET | Freq: Three times a day (TID) | ORAL | 0 refills | Status: AC | PRN

## 2020-05-27 MED ORDER — ALBUTEROL SULFATE HFA 108 (90 BASE) MCG/ACT IN AERS
2.0000 | INHALATION_SPRAY | Freq: Once | RESPIRATORY_TRACT | Status: AC
  Administered 2020-05-27: 2 via RESPIRATORY_TRACT

## 2020-05-27 MED ORDER — CILOSTAZOL 100 MG PO TABS: 100.0000 mg | ORAL_TABLET | Freq: Two times a day (BID) | ORAL | 2 refills | Status: DC

## 2020-05-27 MED ORDER — HYDROCODONE-ACETAMINOPHEN 10-325 MG PO TABS: 0.5000 | ORAL_TABLET | Freq: Three times a day (TID) | ORAL | 0 refills | Status: DC | PRN

## 2020-05-27 NOTE — Progress Notes (Signed)
Heather Perkins is a 86 y.o. female who presents to  [place] at [facility]  today, 05/28/20, seeking care for the following:  Marland Kitchen Maintenance of opiate pain Rx - updated contract  . Heat exhaustion - on the way into the clinic was feeling hot and faint after exiting the car. (+)nausea and SOB. Felt improved after cold pack to neck and few glasses of water. Daughter accompanying her. Pt states she has felt fine otherwise. No CP/SOB. Physical exam CV: RRR, WNL S1S2, no LE edema. CTABL w/ diminished breath sounds bilaterally c/w previous exams. Pt declined labs today.     ASSESSMENT & PLAN with other pertinent findings:  The primary encounter diagnosis was Nausea. Diagnoses of DDD (degenerative disc disease), cervical, Chronic bronchitis, unspecified chronic bronchitis type ([place]), Claudication of right lower extremity ([place]), and Heat exhaustion, initial encounter were also pertinent to this visit.      Meds ordered this encounter  Medications  . HYDROcodone-acetaminophen (NORCO) 10-325 MG tablet    Sig: Take 0.5-1 tablets by mouth every 8 (eight) hours as needed.    Dispense:  90 tablet    Refill:  0  . HYDROcodone-acetaminophen (NORCO) 10-325 MG tablet    Sig: Take 0.5-1 tablets by mouth every 8 (eight) hours as needed for moderate pain or severe pain.    Dispense:  90 tablet    Refill:  0  . HYDROcodone-acetaminophen (NORCO) 10-325 MG tablet    Sig: Take 0.5-1 tablets by mouth every 8 (eight) hours as needed.    Dispense:  90 tablet    Refill:  0  . albuterol (VENTOLIN HFA) 108 (90 Base) MCG/ACT inhaler 2 puff  . ondansetron (ZOFRAN-ODT) disintegrating tablet 8 mg  . cilostazol (PLETAL) 100 MG tablet    Sig: Take 1 tablet (100 mg total) by mouth 2 (two) times daily.    Dispense:  180 tablet    Refill:  2       Follow-up instructions: Return in about 3 months (around 08/27/2020) for VIRTUAL OR IN-OFFICE TO MAINTAIN PAIN RX - SEE ME  SOONER IF NEEDED .                                         BP 118/63   Pulse 76   Temp (!) 97.5 F (36.4 C) (Oral)   Ht 5\' 3"  (1.6 m)   Wt 120 lb (54.4 kg)   SpO2 94%   BMI 21.26 kg/m   Current Meds  Medication Sig  . acetaminophen (TYLENOL) 650 MG CR tablet Take 1 tablet (650 mg total) by mouth every 8 (eight) hours as needed for pain.  Marland Kitchen albuterol (VENTOLIN HFA) 108 (90 Base) MCG/ACT inhaler Inhale 2 puffs into the lungs every 6 (six) hours as needed for wheezing or shortness of breath.  . ALPRAZolam (XANAX) 0.5 MG tablet Take 0.5 mg by mouth as needed for anxiety.  Marland Kitchen aspirin EC 81 MG tablet Take 1 tablet (81 mg total) by mouth daily.  . B Complex Vitamins (B COMPLEX PO) Take by mouth.  . Calcium-Magnesium-Zinc 500-250-12.5 MG TABS Take by mouth daily. TAKE 1 CAPSULE BY MOUTH 2 TIMES DAILY  . carbamide peroxide (DEBROX) 6.5 % OTIC solution Place 10 drops into both ears 2 (two) times daily. Dispense 1 bottle  . cholecalciferol (VITAMIN D3) 25 MCG (1000 UNIT) tablet Take 1,000  Units by mouth daily.  . cilostazol (PLETAL) 100 MG tablet Take 1 tablet (100 mg total) by mouth 2 (two) times daily.  . cyclobenzaprine (FLEXERIL) 10 MG tablet Take 0.5-1 tablets (5-10 mg total) by mouth 3 (three) times daily as needed for muscle spasms.  . fish oil-omega-3 fatty acids 1000 MG capsule Take 2 g by mouth daily.  . fluticasone (FLONASE) 50 MCG/ACT nasal spray Place 2 sprays into both nostrils daily.  Marland Kitchen gabapentin (NEURONTIN) 300 MG capsule Take 1 capsule (300 mg total) by mouth 2 (two) times daily as needed.  Derrill Memo ON 07/26/2020] HYDROcodone-acetaminophen (NORCO) 10-325 MG tablet Take 0.5-1 tablets by mouth every 8 (eight) hours as needed.  Derrill Memo ON 06/26/2020] HYDROcodone-acetaminophen (NORCO) 10-325 MG tablet Take 0.5-1 tablets by mouth every 8 (eight) hours as needed for moderate pain or severe pain.  Marland Kitchen HYDROcodone-acetaminophen (NORCO) 10-325 MG tablet  Take 0.5-1 tablets by mouth every 8 (eight) hours as needed.  . magnesium oxide (MAG-OX) 400 MG tablet Take 2 tablets (800 mg total) by mouth at bedtime.  Marland Kitchen omeprazole (PRILOSEC) 20 MG capsule TAKE 1 CAPSULE(20 MG) BY MOUTH TWICE DAILY BEFORE A MEAL  . ondansetron (ZOFRAN) 4 MG tablet Take 1-2 tablets (4-8 mg total) by mouth every 8 (eight) hours as needed for nausea or vomiting.  . polyethylene glycol (MIRALAX / GLYCOLAX) packet Take 17 g by mouth daily.    . pravastatin (PRAVACHOL) 40 MG tablet TAKE 1 TABLET(40 MG) BY MOUTH DAILY  . promethazine (PHENERGAN) 25 MG tablet Take by mouth.  . propylthiouracil (PTU) 50 MG tablet Take 1 tablet (50 mg total) by mouth 2 (two) times daily.  . Tiotropium Bromide Monohydrate (SPIRIVA RESPIMAT) 2.5 MCG/ACT AERS 2 INHALATIONS DAILY TO HELP REDUCE COUGH  . [DISCONTINUED] cilostazol (PLETAL) 100 MG tablet Take 1 tablet (100 mg total) by mouth 2 (two) times daily.  . [DISCONTINUED] HYDROcodone-acetaminophen (NORCO) 10-325 MG tablet Take 0.5-1 tablets by mouth every 8 (eight) hours as needed for moderate pain or severe pain.  . [DISCONTINUED] HYDROcodone-acetaminophen (NORCO) 10-325 MG tablet Take 0.5-1 tablets by mouth every 8 (eight) hours as needed.  . [DISCONTINUED] HYDROcodone-acetaminophen (NORCO) 10-325 MG tablet Take 0.5-1 tablets by mouth every 8 (eight) hours as needed.    No results found for this or any previous visit (from the past 72 hour(s)).  No results found.     All questions at time of visit were answered - patient instructed to contact office with any additional concerns or updates.  ER/RTC precautions were reviewed with the patient as applicable.   Please note: voice recognition software was used to produce this document, and typos may escape review. Please contact Dr. Sheppard Coil for any needed clarifications.   Total encounter time: 30 minutes.

## 2020-05-28 ENCOUNTER — Encounter: Payer: Self-pay | Admitting: Osteopathic Medicine

## 2020-06-12 ENCOUNTER — Other Ambulatory Visit: Payer: Self-pay | Admitting: Osteopathic Medicine

## 2020-06-12 DIAGNOSIS — M48062 Spinal stenosis, lumbar region with neurogenic claudication: Secondary | ICD-10-CM

## 2020-06-12 DIAGNOSIS — J449 Chronic obstructive pulmonary disease, unspecified: Secondary | ICD-10-CM

## 2020-06-21 ENCOUNTER — Other Ambulatory Visit: Payer: Self-pay | Admitting: Osteopathic Medicine

## 2020-06-21 DIAGNOSIS — E0590 Thyrotoxicosis, unspecified without thyrotoxic crisis or storm: Secondary | ICD-10-CM

## 2020-07-07 DIAGNOSIS — H5202 Hypermetropia, left eye: Secondary | ICD-10-CM | POA: Diagnosis not present

## 2020-07-08 DIAGNOSIS — L501 Idiopathic urticaria: Secondary | ICD-10-CM | POA: Diagnosis not present

## 2020-07-08 DIAGNOSIS — L209 Atopic dermatitis, unspecified: Secondary | ICD-10-CM | POA: Diagnosis not present

## 2020-08-04 ENCOUNTER — Other Ambulatory Visit: Payer: Self-pay

## 2020-08-04 MED ORDER — ALPRAZOLAM 0.5 MG PO TABS: 0.5000 mg | ORAL_TABLET | ORAL | 1 refills | Status: DC | PRN

## 2020-08-04 NOTE — Telephone Encounter (Signed)
Pt requesting med refill for alprazolam. Rx written by historical provider. Rx pended.

## 2020-08-05 DIAGNOSIS — C4441 Basal cell carcinoma of skin of scalp and neck: Secondary | ICD-10-CM | POA: Diagnosis not present

## 2020-08-05 DIAGNOSIS — L821 Other seborrheic keratosis: Secondary | ICD-10-CM | POA: Diagnosis not present

## 2020-08-18 ENCOUNTER — Ambulatory Visit: Payer: Medicare HMO | Admitting: Osteopathic Medicine

## 2020-08-23 ENCOUNTER — Encounter: Payer: Self-pay | Admitting: Osteopathic Medicine

## 2020-08-23 ENCOUNTER — Telehealth (INDEPENDENT_AMBULATORY_CARE_PROVIDER_SITE_OTHER): Payer: Medicare HMO | Admitting: Osteopathic Medicine

## 2020-08-23 VITALS — Wt 116.3 lb

## 2020-08-23 DIAGNOSIS — E0590 Thyrotoxicosis, unspecified without thyrotoxic crisis or storm: Secondary | ICD-10-CM

## 2020-08-23 DIAGNOSIS — J449 Chronic obstructive pulmonary disease, unspecified: Secondary | ICD-10-CM

## 2020-08-23 DIAGNOSIS — M5030 Other cervical disc degeneration, unspecified cervical region: Secondary | ICD-10-CM

## 2020-08-23 DIAGNOSIS — R0989 Other specified symptoms and signs involving the circulatory and respiratory systems: Secondary | ICD-10-CM

## 2020-08-23 DIAGNOSIS — Z9189 Other specified personal risk factors, not elsewhere classified: Secondary | ICD-10-CM

## 2020-08-23 DIAGNOSIS — R519 Headache, unspecified: Secondary | ICD-10-CM | POA: Diagnosis not present

## 2020-08-23 NOTE — Progress Notes (Signed)
Virtual Visit via Video (App used: MyChart) Note  I connected with      Heather Perkins on 08/23/20 at 3:45 PM  by a telemedicine application and verified that I am speaking with the correct person using two identifiers.  Patient is at home w/ daughter who assists with the visit (pt is hard of hearing)  I am in office   I discussed the limitations of evaluation and management by telemedicine and the availability of in person appointments. The patient expressed understanding and agreed to proceed.  History of Present Illness: Heather Perkins is a 86 y.o. female who would like to discuss maintenance of pain Rx   Indication for chronic opioid: severe arthritis Medication and dose: Hydrocodone-APAP 10-325 # pills per month: 90 Last UDS date: due Opioid Treatment Agreement signed (Y/N): 05/27/2020 Opioid Treatment Agreement last reviewed with patient:   N[facility] reviewed this encounter (include red flags):  No concerns   Pt is on alprazolam as well, sparing use.   Does not take pain Rx or anxiety Rx at bedtime, or if she does she doesn't take them together and will take 1/2 tablet   Has had a few episodes fo waking up choking at night. Feels tired and aching the next day. Long ago had sleep study which was normal.  Persistent headache, would like referral to neurologist.          Observations/Objective: Wt 116 lb 4.8 oz (52.8 kg)   BMI 20.60 kg/m  BP Readings from Last 3 Encounters:  05/27/20 118/63  04/01/20 (!) 123/46  11/27/19 127/61   Exam: Normal Speech.  NAD  Lab and Radiology Results No results found for this or any previous visit (from the past 72 hour(s)). No results found.     Assessment and Plan: 86 y.o. female with The primary encounter diagnosis was Choking episode occurring at night. Diagnoses of Risk factors for obstructive sleep apnea, Hyperthyroidism, Chronic obstructive pulmonary disease, unspecified COPD type ([place]), and Persistent headaches were  also pertinent to this visit.  1. Choking episode occurring at night 2. Risk factors for obstructive sleep apnea Home sleep study ordered  3. Hyperthyroidism Daughter requested labs w/ next blood draw, let's recheck   4. Chronic obstructive pulmonary disease, unspecified COPD type ([place]) May also be contributing to SOB/choking at night but seems unlikely. May need O2 qhs  5. Persistent headaches Neurology referral  6. Arthritis, Cervical DDD  OK to continue Rx   PDMP not reviewed this encounter. Orders Placed This Encounter  Procedures  . COMPLETE METABOLIC PANEL WITH GFR  . TSH  . T4, free  . Ambulatory referral to Neurology    Referral Priority:   Routine    Referral Type:   Consultation    Referral Reason:   Specialty Services Required    Requested Specialty:   Neurology    Number of Visits Requested:   1  . Home sleep test    Order Specific Question:   Where should this test be performed:    Answer:   [facility]   No orders of the defined types were placed in this encounter.    Follow Up Instructions: Return in about 3 months (around 11/23/2020) for maintain opiate Rx , VIRTUAL *OR* IN-OFFICE VISIT.    I discussed the assessment and treatment plan with the patient. The patient was provided an opportunity to ask questions and all were answered. The patient agreed with the plan and demonstrated an understanding  of the instructions.   The patient was advised to call back or seek an in-person evaluation if any new concerns, if symptoms worsen or if the condition fails to improve as anticipated.  30 minutes of non-face-to-face time was provided during this encounter.      . . . . . . . . . . . . . Marland Kitchen                   Historical information moved to improve visibility of documentation.  Past Medical History:  Diagnosis Date  . Allergy   . Cancer ([place])    skin  . Colitis, ischemic ([place])   . Depression   .  Fibromyalgia   . GERD (gastroesophageal reflux disease)   . Hyperthyroidism 2010   sees Dr Marcello Moores, did RAI  . Peripheral vascular disease ([place])   . Shingles    Past Surgical History:  Procedure Laterality Date  . ABDOMINAL HYSTERECTOMY  1975   for dub  . APPENDECTOMY  1939  . BREAST BIOPSY  1954  . [place]   right  . CATARACT EXTRACTION  2009   right and left.   . CERVICAL FUSION    . [place]  . ORIF ANKLE FRACTURE  1979   left  . TONSILLECTOMY     Social History   Tobacco Use  . Smoking status: Current Every Day Smoker    Packs/day: 1.00    Years: 50.00    Pack years: 50.00    Types: Cigarettes  . Smokeless tobacco: Never Used  . Tobacco comment: pt states that she has been trying to quit but can't seem to quit  Substance Use Topics  . Alcohol use: No   family history includes Cancer in her daughter; Hyperlipidemia in her daughter; Hypertension in her sister.  Medications: Current Outpatient Medications  Medication Sig Dispense Refill  . acetaminophen (TYLENOL) 650 MG CR tablet Take 1 tablet (650 mg total) by mouth every 8 (eight) hours as needed for pain. 90 tablet 3  . albuterol (VENTOLIN HFA) 108 (90 Base) MCG/ACT inhaler Inhale 2 puffs into the lungs every 6 (six) hours as needed for wheezing or shortness of breath. 18 g 11  . ALPRAZolam (XANAX) 0.5 MG tablet Take 1 tablet (0.5 mg total) by mouth as needed for anxiety. 30 tablet 1  . aspirin EC 81 MG tablet Take 1 tablet (81 mg total) by mouth daily. 90 tablet 3  . B Complex Vitamins (B COMPLEX PO) Take by mouth.    . Calcium-Magnesium-Zinc 500-250-12.5 MG TABS Take by mouth daily. TAKE 1 CAPSULE BY MOUTH 2 TIMES DAILY    . carbamide peroxide (DEBROX) 6.5 % OTIC solution Place 10 drops into both ears 2 (two) times daily. Dispense 1 bottle 15 mL 99  . cholecalciferol (VITAMIN D3) 25 MCG (1000 UNIT) tablet Take 1,000 Units by mouth daily.    . cilostazol (PLETAL) 100 MG  tablet Take 1 tablet (100 mg total) by mouth 2 (two) times daily. 180 tablet 2  . cyclobenzaprine (FLEXERIL) 10 MG tablet Take 0.5-1 tablets (5-10 mg total) by mouth 3 (three) times daily as needed for muscle spasms. 90 tablet 3  . fish oil-omega-3 fatty acids 1000 MG capsule Take 2 g by mouth daily.    . fluticasone (FLONASE) 50 MCG/ACT nasal spray Place 2 sprays into both nostrils daily. 16 g 11  . gabapentin (NEURONTIN) 300 MG capsule TAKE 1 CAPSULE(300 MG) BY  MOUTH TWICE DAILY AS NEEDED 180 capsule 1  . HYDROcodone-acetaminophen (NORCO) 10-325 MG tablet Take 0.5-1 tablets by mouth every 8 (eight) hours as needed. 90 tablet 0  . magnesium oxide (MAG-OX) 400 MG tablet Take 2 tablets (800 mg total) by mouth at bedtime. 180 tablet 3  . omeprazole (PRILOSEC) 20 MG capsule TAKE 1 CAPSULE(20 MG) BY MOUTH TWICE DAILY BEFORE A MEAL 180 capsule 2  . ondansetron (ZOFRAN) 4 MG tablet Take 1-2 tablets (4-8 mg total) by mouth every 8 (eight) hours as needed for nausea or vomiting. 40 tablet 0  . polyethylene glycol (MIRALAX / GLYCOLAX) packet Take 17 g by mouth daily.      . pravastatin (PRAVACHOL) 40 MG tablet TAKE 1 TABLET(40 MG) BY MOUTH DAILY 90 tablet 3  . promethazine (PHENERGAN) 25 MG tablet Take by mouth.    . propylthiouracil (PTU) 50 MG tablet TAKE 1 TABLET(50 MG) BY MOUTH TWICE DAILY 180 tablet 0  . Tiotropium Bromide Monohydrate (SPIRIVA RESPIMAT) 2.5 MCG/ACT AERS INHALE 2 PUFFS BY MOUTH DAILY TO HELP REDUCE COUGH 4 g 2   No current facility-administered medications for this visit.   Allergies  Allergen Reactions  . Amitriptyline Other (See Comments)  . Amoxicillin-Pot Clavulanate Nausea And Vomiting    Vomiting   . Bupropion Other (See Comments)    constipation   . Citalopram Hydrobromide Other (See Comments)  . Codeine Nausea And Vomiting and Nausea Only  . Erythromycin Nausea And Vomiting  . Escitalopram Oxalate Nausea And Vomiting  . Methimazole Other (See Comments)  .  Neomycin-Bacitracin Zn-Polymyx Other (See Comments)  . Neomycin-Bacitracin-Polymyxin  [Bacitracin-Neomycin-Polymyxin] Other (See Comments)  . Other Nausea And Vomiting, Nausea Only and Rash    All Mycins  . Pentazocine Nausea And Vomiting  . Pitavastatin Other (See Comments)    Headache.   . Salsalate Other (See Comments)  . Tetracyclines & Related Rash  . Azithromycin   . Crestor [Rosuvastatin Calcium] Nausea Only  . Elavil [Amitriptyline Hcl]   . Livalo [Pitavastatin Calcium] Other (See Comments)    Headache.   . Naproxen     nausea  . Neosporin [Neomycin-Polymyxin-Gramicidin]   . Prozac [Fluoxetine]   . Rosuvastatin

## 2020-08-25 DIAGNOSIS — H6040 Cholesteatoma of external ear, unspecified ear: Secondary | ICD-10-CM | POA: Diagnosis not present

## 2020-08-27 ENCOUNTER — Telehealth: Payer: Self-pay

## 2020-08-27 NOTE — Telephone Encounter (Signed)
Pt's daughter called requesting a change of location for the Neurologist. She wants to switch from [place] to [place]. Thanks.

## 2020-08-30 NOTE — Telephone Encounter (Signed)
Sent referral to [facility] [place]

## 2020-09-13 ENCOUNTER — Telehealth: Payer: Self-pay

## 2020-09-13 MED ORDER — HYDROCODONE-ACETAMINOPHEN 10-325 MG PO TABS: 0.5000 | ORAL_TABLET | Freq: Three times a day (TID) | ORAL | 0 refills | Status: DC | PRN

## 2020-09-13 NOTE — Telephone Encounter (Signed)
Pt's daughter called on behalf of pt. Requesting med refill for hydrocodone. Rx not listed in active med list. Pls send the rx to the pharmacy on file.

## 2020-09-13 NOTE — Telephone Encounter (Signed)
Done

## 2020-09-22 ENCOUNTER — Other Ambulatory Visit: Payer: Self-pay | Admitting: Osteopathic Medicine

## 2020-09-22 DIAGNOSIS — R0989 Other specified symptoms and signs involving the circulatory and respiratory systems: Secondary | ICD-10-CM | POA: Diagnosis not present

## 2020-09-22 DIAGNOSIS — Z9189 Other specified personal risk factors, not elsewhere classified: Secondary | ICD-10-CM | POA: Diagnosis not present

## 2020-09-22 DIAGNOSIS — E0590 Thyrotoxicosis, unspecified without thyrotoxic crisis or storm: Secondary | ICD-10-CM | POA: Diagnosis not present

## 2020-09-22 DIAGNOSIS — J449 Chronic obstructive pulmonary disease, unspecified: Secondary | ICD-10-CM | POA: Diagnosis not present

## 2020-09-23 LAB — COMPLETE METABOLIC PANEL WITH GFR
AG Ratio: 1.9 (calc) (ref 1.0–2.5)
ALT: 10 U/L (ref 6–29)
AST: 17 U/L (ref 10–35)
Albumin: 4.2 g/dL (ref 3.6–5.1)
Alkaline phosphatase (APISO): 58 U/L (ref 37–153)
BUN: 10 mg/dL (ref 7–25)
CO2: 26 mmol/L (ref 20–32)
Calcium: 9.3 mg/dL (ref 8.6–10.4)
Chloride: 103 mmol/L (ref 98–110)
Creat: 0.86 mg/dL (ref 0.60–0.88)
GFR, Est African American: 72 mL/min/{1.73_m2} (ref >=60)
GFR, Est Non African American: 62 mL/min/{1.73_m2} (ref >=60)
Globulin: 2.2 g/dL (calc) (ref 1.9–3.7)
Glucose, Bld: 90 mg/dL (ref 65–99)
Potassium: 4.7 mmol/L (ref 3.5–5.3)
Sodium: 137 mmol/L (ref 135–146)
Total Bilirubin: 0.8 mg/dL (ref 0.2–1.2)
Total Protein: 6.4 g/dL (ref 6.1–8.1)

## 2020-09-23 LAB — T4, FREE: Free T4: 1.3 ng/dL (ref 0.8–1.8)

## 2020-09-23 LAB — TSH: TSH: 1.58 mIU/L (ref 0.40–4.50)

## 2020-09-28 ENCOUNTER — Telehealth: Payer: Self-pay | Admitting: General Practice

## 2020-10-05 ENCOUNTER — Encounter: Payer: Self-pay | Admitting: Osteopathic Medicine

## 2020-10-05 ENCOUNTER — Ambulatory Visit (INDEPENDENT_AMBULATORY_CARE_PROVIDER_SITE_OTHER): Payer: Medicare HMO | Admitting: Osteopathic Medicine

## 2020-10-05 ENCOUNTER — Ambulatory Visit: Payer: Medicare HMO

## 2020-10-05 ENCOUNTER — Other Ambulatory Visit: Payer: Self-pay

## 2020-10-05 VITALS — BP 125/80 | HR 100 | Temp 97.5°F | Wt 121.6 lb

## 2020-10-05 DIAGNOSIS — M810 Age-related osteoporosis without current pathological fracture: Secondary | ICD-10-CM

## 2020-10-05 DIAGNOSIS — I739 Peripheral vascular disease, unspecified: Secondary | ICD-10-CM | POA: Diagnosis not present

## 2020-10-05 DIAGNOSIS — K219 Gastro-esophageal reflux disease without esophagitis: Secondary | ICD-10-CM | POA: Diagnosis not present

## 2020-10-05 DIAGNOSIS — E782 Mixed hyperlipidemia: Secondary | ICD-10-CM | POA: Diagnosis not present

## 2020-10-05 DIAGNOSIS — J449 Chronic obstructive pulmonary disease, unspecified: Secondary | ICD-10-CM | POA: Diagnosis not present

## 2020-10-05 DIAGNOSIS — H9193 Unspecified hearing loss, bilateral: Secondary | ICD-10-CM | POA: Diagnosis not present

## 2020-10-05 DIAGNOSIS — Z0000 Encounter for general adult medical examination without abnormal findings: Secondary | ICD-10-CM

## 2020-10-05 DIAGNOSIS — E0590 Thyrotoxicosis, unspecified without thyrotoxic crisis or storm: Secondary | ICD-10-CM | POA: Diagnosis not present

## 2020-10-05 DIAGNOSIS — Z8711 Personal history of peptic ulcer disease: Secondary | ICD-10-CM

## 2020-10-05 DIAGNOSIS — M1991 Primary osteoarthritis, unspecified site: Secondary | ICD-10-CM

## 2020-10-05 MED ORDER — DENOSUMAB 60 MG/ML ~~LOC~~ SOSY
60.0000 mg | PREFILLED_SYRINGE | Freq: Once | SUBCUTANEOUS | Status: AC
  Administered 2020-10-05: 60 mg via SUBCUTANEOUS

## 2020-10-05 NOTE — Progress Notes (Signed)
Heather Perkins is a 86 y.o. female who presents to  [facility] at [facility]  today, 10/05/20, seeking care for the following:  . Prolia injection  . Annual physical  . Pt is cutting back on smoking! . Tired but no more dizziness spells since stopping the qhs dose gabapentin  . Accompanied today by daughter       ASSESSMENT & PLAN with other pertinent findings:  The primary encounter diagnosis was Annual physical exam. Diagnoses of Age related osteoporosis, unspecified pathological fracture presence, Chronic obstructive pulmonary disease, unspecified COPD type (HCC), Hyperthyroidism, Claudication of right lower extremity (HCC), Gastroesophageal reflux disease, unspecified whether esophagitis present, History of peptic ulcer, Mixed hyperlipidemia, Bilateral hearing loss, unspecified hearing loss type, and Primary osteoarthritis, unspecified site were also pertinent to this visit.   No results found for this or any previous visit (from the past 24 hour(s)).  --> reduce/stop gabapentin and cyclobenzaprine may improve energy levels  --> ok to continue other meds   Patient Instructions  General Preventive Care  Most recent routine screening labs: NORMAL.   Blood pressure goal 140/90 or less.   Tobacco: Please let me know if you need help quitting!  Alcohol: responsible moderation is ok for most adults - if you have concerns about your alcohol intake, please talk to me!   Exercise: as tolerated to reduce risk of cardiovascular disease and diabetes. Strength training will also prevent osteoporosis.   Mental health: if need for mental health care (medicines, counseling, other), or concerns about moods, please let me know!   Sexual / Reproductive health: if need for STD testing, or if concerns with libido/pain problems, please let me know! If you need to discuss family planning, please let me know!   Advanced Directive: Living Will and/or  Healthcare Power of Attorney recommended for all adults, regardless of age or health.  Vaccines  Flu vaccine: recommended every fall.   Shingles vaccine: optional but not covered by Medicare here - ask your pharmacist!    Pneumonia vaccines: all done!  Tetanus booster: routine booster not needed until 2023  COVID vaccine: BOOSTER IS STRONGLY RECOMMENDED  Cancer screenings   Colon cancer screening: optional after age 68    Breast cancer screening: mammogram optional after age 61  Cervical cancer screening: Pap not needed after hysterectomy  Lung cancer screening: CT chest every year optional after age 71 Infection screenings  . HIV and Hepatitis C screening by age 69, more often as needed. . Gonorrhea/Chlamydia: screening as needed . TB: certain at-risk populations Other . Bone Density Test: due 08/2021   No orders of the defined types were placed in this encounter.   Meds ordered this encounter  Medications  . denosumab (PROLIA) injection 60 mg       Follow-up instructions: Return in about 3 months (around 01/03/2021) for MAINTAIN OPIATE PAIN RX, SEE [place] SOONER IF NEEDED! .                                         BP 125/80   Pulse 100   Temp (!) 97.5 F (36.4 C)   Wt 121 lb 9.6 oz (55.2 kg)   SpO2 (!) 84%   BMI 21.54 kg/m   Current Meds  Medication Sig  . acetaminophen (TYLENOL) 650 MG CR tablet Take 1 tablet (650 mg total) by mouth every 8 (  eight) hours as needed for pain.  Marland Kitchen albuterol (VENTOLIN HFA) 108 (90 Base) MCG/ACT inhaler Inhale 2 puffs into the lungs every 6 (six) hours as needed for wheezing or shortness of breath.  . ALPRAZolam (XANAX) 0.5 MG tablet Take 1 tablet (0.5 mg total) by mouth as needed for anxiety.  Marland Kitchen aspirin EC 81 MG tablet Take 1 tablet (81 mg total) by mouth daily.  . B Complex Vitamins (B COMPLEX PO) Take by mouth.  . Calcium-Magnesium-Zinc 500-250-12.5 MG TABS Take by mouth daily. TAKE 1 CAPSULE  BY MOUTH 2 TIMES DAILY  . carbamide peroxide (DEBROX) 6.5 % OTIC solution Place 10 drops into both ears 2 (two) times daily. Dispense 1 bottle  . cholecalciferol (VITAMIN D3) 25 MCG (1000 UNIT) tablet Take 1,000 Units by mouth daily.  . cilostazol (PLETAL) 100 MG tablet Take 1 tablet (100 mg total) by mouth 2 (two) times daily.  . cyclobenzaprine (FLEXERIL) 10 MG tablet Take 0.5-1 tablets (5-10 mg total) by mouth 3 (three) times daily as needed for muscle spasms.  . fish oil-omega-3 fatty acids 1000 MG capsule Take 2 g by mouth daily.  . fluticasone (FLONASE) 50 MCG/ACT nasal spray Place 2 sprays into both nostrils daily.  Marland Kitchen gabapentin (NEURONTIN) 300 MG capsule TAKE 1 CAPSULE(300 MG) BY MOUTH TWICE DAILY AS NEEDED  . HYDROcodone-acetaminophen (NORCO) 10-325 MG tablet Take 0.5-1 tablets by mouth every 8 (eight) hours as needed.  . magnesium oxide (MAG-OX) 400 MG tablet Take 2 tablets (800 mg total) by mouth at bedtime.  Marland Kitchen omeprazole (PRILOSEC) 20 MG capsule TAKE 1 CAPSULE(20 MG) BY MOUTH TWICE DAILY BEFORE A MEAL  . ondansetron (ZOFRAN) 4 MG tablet Take 1-2 tablets (4-8 mg total) by mouth every 8 (eight) hours as needed for nausea or vomiting.  . polyethylene glycol (MIRALAX / GLYCOLAX) packet Take 17 g by mouth daily.  . pravastatin (PRAVACHOL) 40 MG tablet TAKE 1 TABLET(40 MG) BY MOUTH DAILY  . promethazine (PHENERGAN) 25 MG tablet Take by mouth.  . propylthiouracil (PTU) 50 MG tablet TAKE 1 TABLET(50 MG) BY MOUTH TWICE DAILY  . Tiotropium Bromide Monohydrate (SPIRIVA RESPIMAT) 2.5 MCG/ACT AERS INHALE 2 PUFFS BY MOUTH DAILY TO HELP REDUCE COUGH    No results found for this or any previous visit (from the past 72 hour(s)).  No results found.  Immunization History  Administered Date(s) Administered  . Influenza, High Dose Seasonal PF 08/15/2017  . Influenza,inj,Quad PF,6+ Mos 11/15/2016  . PFIZER SARS-COV-2 Vaccination 01/03/2020, 01/24/2020  . Pneumococcal Conjugate-13 11/15/2016  .  Pneumococcal Polysaccharide-23 10/09/2008  . Tdap 03/27/2012      All questions at time of visit were answered - patient instructed to contact office with any additional concerns or updates.  ER/RTC precautions were reviewed with the patient as applicable.   Please note: voice recognition software was used to produce this document, and typos may escape review. Please contact Dr. Sheppard Coil for any needed clarifications.

## 2020-10-05 NOTE — Patient Instructions (Signed)
General Preventive Care  Most recent routine screening labs: NORMAL.   Blood pressure goal 140/90 or less.   Tobacco: Please let me know if you need help quitting!  Alcohol: responsible moderation is ok for most adults - if you have concerns about your alcohol intake, please talk to me!   Exercise: as tolerated to reduce risk of cardiovascular disease and diabetes. Strength training will also prevent osteoporosis.   Mental health: if need for mental health care (medicines, counseling, other), or concerns about moods, please let me know!   Sexual / Reproductive health: if need for STD testing, or if concerns with libido/pain problems, please let me know! If you need to discuss family planning, please let me know!   Advanced Directive: Living Will and/or Healthcare Power of Attorney recommended for all adults, regardless of age or health.  Vaccines  Flu vaccine: recommended every fall.   Shingles vaccine: optional but not covered by Medicare here - ask your pharmacist!    Pneumonia vaccines: all done!  Tetanus booster: routine booster not needed until 2023  COVID vaccine: BOOSTER IS STRONGLY RECOMMENDED  Cancer screenings   Colon cancer screening: optional after age 25    Breast cancer screening: mammogram optional after age 68  Cervical cancer screening: Pap not needed after hysterectomy  Lung cancer screening: CT chest every year optional after age 41 Infection screenings  . HIV and Hepatitis C screening by age 49, more often as needed. . Gonorrhea/Chlamydia: screening as needed . TB: certain at-risk populations Other . Bone Density Test: due 08/2021

## 2020-10-13 NOTE — Telephone Encounter (Signed)
Documentation

## 2020-10-18 ENCOUNTER — Telehealth: Payer: Self-pay

## 2020-10-18 MED ORDER — HYDROCODONE-ACETAMINOPHEN 10-325 MG PO TABS
0.5000 | ORAL_TABLET | Freq: Three times a day (TID) | ORAL | 0 refills | Status: DC | PRN
Start: 2020-10-18 — End: 2020-11-29

## 2020-10-18 NOTE — Telephone Encounter (Signed)
Task completed. Pt's daughter [name] has been updated regarding pt's med refill.

## 2020-10-18 NOTE — Telephone Encounter (Signed)
sent 

## 2020-10-18 NOTE — Telephone Encounter (Signed)
Pt's daughter called on behalf of pt. Requesting med refill for hydrocodone-ace. Rx not listed in active med list.

## 2020-11-10 ENCOUNTER — Ambulatory Visit: Payer: Self-pay | Admitting: Neurology

## 2020-11-16 ENCOUNTER — Other Ambulatory Visit: Payer: Self-pay | Admitting: Osteopathic Medicine

## 2020-11-16 DIAGNOSIS — K279 Peptic ulcer, site unspecified, unspecified as acute or chronic, without hemorrhage or perforation: Secondary | ICD-10-CM

## 2020-11-29 ENCOUNTER — Other Ambulatory Visit: Payer: Self-pay | Admitting: Neurology

## 2020-11-29 MED ORDER — HYDROCODONE-ACETAMINOPHEN 10-325 MG PO TABS: 0.5000 | ORAL_TABLET | Freq: Three times a day (TID) | ORAL | 0 refills | Status: AC | PRN

## 2020-11-29 NOTE — Telephone Encounter (Signed)
Patient's daughter Pam left vm asking for refill on hydrocodone. Last written 10/18/2020 #90 with no refills. Last appt 10/05/2020. Not on current med list. Please advise.

## 2020-12-14 ENCOUNTER — Other Ambulatory Visit: Payer: Self-pay

## 2020-12-14 DIAGNOSIS — J449 Chronic obstructive pulmonary disease, unspecified: Secondary | ICD-10-CM

## 2020-12-14 MED ORDER — SPIRIVA RESPIMAT 2.5 MCG/ACT IN AERS: INHALATION_SPRAY | RESPIRATORY_TRACT | 2 refills | Status: DC

## 2020-12-22 DIAGNOSIS — H6040 Cholesteatoma of external ear, unspecified ear: Secondary | ICD-10-CM | POA: Diagnosis not present

## 2021-01-05 ENCOUNTER — Telehealth (INDEPENDENT_AMBULATORY_CARE_PROVIDER_SITE_OTHER): Payer: Medicare HMO | Admitting: Osteopathic Medicine

## 2021-01-05 DIAGNOSIS — J449 Chronic obstructive pulmonary disease, unspecified: Secondary | ICD-10-CM

## 2021-01-05 NOTE — Progress Notes (Signed)
Attempted to contact the patient at 845 am. No answer, left a vm msg.

## 2021-01-12 ENCOUNTER — Encounter: Payer: Self-pay | Admitting: Osteopathic Medicine

## 2021-01-12 ENCOUNTER — Telehealth (INDEPENDENT_AMBULATORY_CARE_PROVIDER_SITE_OTHER): Payer: Medicare HMO | Admitting: Osteopathic Medicine

## 2021-01-12 VITALS — BP 125/84 | HR 89 | Temp 97.2°F | Wt 119.1 lb

## 2021-01-12 DIAGNOSIS — M48062 Spinal stenosis, lumbar region with neurogenic claudication: Secondary | ICD-10-CM

## 2021-01-12 DIAGNOSIS — M5030 Other cervical disc degeneration, unspecified cervical region: Secondary | ICD-10-CM

## 2021-01-12 DIAGNOSIS — M25551 Pain in right hip: Secondary | ICD-10-CM | POA: Diagnosis not present

## 2021-01-12 MED ORDER — HYDROCODONE-ACETAMINOPHEN 10-325 MG PO TABS: 1.0000 | ORAL_TABLET | Freq: Three times a day (TID) | ORAL | 0 refills | Status: DC | PRN

## 2021-01-12 NOTE — Progress Notes (Signed)
Telemedicine Visit via  Video & Audio (App used: DUKGURK)   I connected with Heather Perkins on 01/12/21 at 1:53 PM  by phone or  telemedicine application as noted above  I verified that I am speaking with or regarding  the correct patient using two identifiers.  Participants: Myself, Dr Emeterio Reeve DO Patient: Heather Perkins Patient proxy if applicable: n/a Other, if applicable: daughter, Pam  Patient is at home I am in office at [facility]    I discussed the limitations of evaluation and management  by telemedicine and the availability of in person appointments.  The participant(s) above expressed understanding and  agreed to proceed with this appointment via telemedicine.       History of Present Illness: Heather Perkins is a 87 y.o. female who would like to discuss refill opiate pain medications - routine.   Prescriptions are working well, patient reports using as directed.  PDMP reviewed, no red flags for early refills, doctor shopping, etc.      Observations/Objective: BP 125/84   Pulse 89   Temp (!) 97.2 F (36.2 C)   Wt 119 lb 1.4 oz (54 kg)   BMI 21.10 kg/m  BP Readings from Last 3 Encounters:  01/12/21 125/84  10/05/20 125/80  05/27/20 118/63   Exam: Normal Speech.  NAD  Lab and Radiology Results No results found for this or any previous visit (from the past 72 hour(s)). No results found.     Assessment and Plan: 87 y.o. female with The primary encounter diagnosis was Spinal stenosis of lumbar region with neurogenic claudication. Diagnoses of DDD (degenerative disc disease), cervical and Right hip pain were also pertinent to this visit.   PDMP reviewed during this encounter. No orders of the defined types were placed in this encounter.  Meds ordered this encounter  Medications  . DISCONTD: HYDROcodone-acetaminophen (NORCO) 10-325 MG tablet    Sig: Take 1 tablet by mouth every 8 (eight) hours as needed for moderate pain or  severe pain.    Dispense:  90 tablet    Refill:  0  . HYDROcodone-acetaminophen (NORCO) 10-325 MG tablet    Sig: Take 1 tablet by mouth every 8 (eight) hours as needed for moderate pain or severe pain.    Dispense:  90 tablet    Refill:  0  . HYDROcodone-acetaminophen (NORCO) 10-325 MG tablet    Sig: Take 1 tablet by mouth every 8 (eight) hours as needed for moderate pain or severe pain.    Dispense:  90 tablet    Refill:  0   There are no Patient Instructions on file for this visit.    Follow Up Instructions: No follow-ups on file.    I discussed the assessment and treatment plan with the patient. The patient was provided an opportunity to ask questions and all were answered. The patient agreed with the plan and demonstrated an understanding of the instructions.   The patient was advised to call back or seek an in-person evaluation if any new concerns, if symptoms worsen or if the condition fails to improve as anticipated.  20 minutes of non-face-to-face time was provided during this encounter.      . . . . . . . . . . . . . Marland Kitchen                   Historical information moved to improve visibility of documentation.  Past Medical History:  Diagnosis Date  . Allergy   .  Cancer ([place])    skin  . Colitis, ischemic ([place])   . Depression   . Fibromyalgia   . GERD (gastroesophageal reflux disease)   . Hyperthyroidism 2010   sees Dr Marcello Moores, did RAI  . Peripheral vascular disease ([place])   . Shingles    Past Surgical History:  Procedure Laterality Date  . ABDOMINAL HYSTERECTOMY  1975   for dub  . APPENDECTOMY  1939  . BREAST BIOPSY  1954  . [place]   right  . CATARACT EXTRACTION  2009   right and left.   . CERVICAL FUSION    . [place]  . ORIF ANKLE FRACTURE  1979   left  . TONSILLECTOMY     Social History   Tobacco Use  . Smoking status: Current Every Day Smoker    Packs/day: 1.00    Years: 50.00     Pack years: 50.00    Types: Cigarettes  . Smokeless tobacco: Never Used  . Tobacco comment: pt states that she has been trying to quit but can't seem to quit  Substance Use Topics  . Alcohol use: No   family history includes Cancer in her daughter; Hyperlipidemia in her daughter; Hypertension in her sister.  Medications: Current Outpatient Medications  Medication Sig Dispense Refill  . acetaminophen (TYLENOL) 650 MG CR tablet Take 1 tablet (650 mg total) by mouth every 8 (eight) hours as needed for pain. 90 tablet 3  . albuterol (VENTOLIN HFA) 108 (90 Base) MCG/ACT inhaler Inhale 2 puffs into the lungs every 6 (six) hours as needed for wheezing or shortness of breath. 18 g 11  . ALPRAZolam (XANAX) 0.5 MG tablet Take 1 tablet (0.5 mg total) by mouth as needed for anxiety. 30 tablet 1  . aspirin EC 81 MG tablet Take 1 tablet (81 mg total) by mouth daily. 90 tablet 3  . B Complex Vitamins (B COMPLEX PO) Take by mouth.    . Calcium-Magnesium-Zinc 500-250-12.5 MG TABS Take by mouth daily. TAKE 1 CAPSULE BY MOUTH 2 TIMES DAILY    . carbamide peroxide (DEBROX) 6.5 % OTIC solution Place 10 drops into both ears 2 (two) times daily. Dispense 1 bottle 15 mL 99  . cholecalciferol (VITAMIN D3) 25 MCG (1000 UNIT) tablet Take 1,000 Units by mouth daily.    . cilostazol (PLETAL) 100 MG tablet Take 1 tablet (100 mg total) by mouth 2 (two) times daily. 180 tablet 2  . cyclobenzaprine (FLEXERIL) 10 MG tablet Take 0.5-1 tablets (5-10 mg total) by mouth 3 (three) times daily as needed for muscle spasms. 90 tablet 3  . fish oil-omega-3 fatty acids 1000 MG capsule Take 2 g by mouth daily.    . fluticasone (FLONASE) 50 MCG/ACT nasal spray Place 2 sprays into both nostrils daily. 16 g 11  . gabapentin (NEURONTIN) 300 MG capsule TAKE 1 CAPSULE(300 MG) BY MOUTH TWICE DAILY AS NEEDED 180 capsule 1  . HYDROcodone-acetaminophen (NORCO) 10-325 MG tablet Take 1 tablet by mouth every 8 (eight) hours as needed for  moderate pain or severe pain. 90 tablet 0  . magnesium oxide (MAG-OX) 400 MG tablet Take 2 tablets (800 mg total) by mouth at bedtime. 180 tablet 3  . omeprazole (PRILOSEC) 20 MG capsule TAKE 1 CAPSULE(20 MG) BY MOUTH TWICE DAILY BEFORE A MEAL 180 capsule 2  . ondansetron (ZOFRAN) 4 MG tablet Take 1-2 tablets (4-8 mg total) by mouth every 8 (eight) hours as needed for nausea or vomiting.  40 tablet 0  . polyethylene glycol (MIRALAX / GLYCOLAX) packet Take 17 g by mouth daily.    . pravastatin (PRAVACHOL) 40 MG tablet TAKE 1 TABLET(40 MG) BY MOUTH DAILY 90 tablet 3  . promethazine (PHENERGAN) 25 MG tablet Take by mouth.    . propylthiouracil (PTU) 50 MG tablet TAKE 1 TABLET(50 MG) BY MOUTH TWICE DAILY 180 tablet 3  . Tiotropium Bromide Monohydrate (SPIRIVA RESPIMAT) 2.5 MCG/ACT AERS INHALE 2 PUFFS BY MOUTH DAILY TO HELP REDUCE COUGH 4 g 2   No current facility-administered medications for this visit.   Allergies  Allergen Reactions  . Amitriptyline Other (See Comments)  . Amoxicillin-Pot Clavulanate Nausea And Vomiting    Vomiting   . Bupropion Other (See Comments)    constipation   . Citalopram Hydrobromide Other (See Comments)  . Codeine Nausea And Vomiting and Nausea Only  . Erythromycin Nausea And Vomiting  . Escitalopram Oxalate Nausea And Vomiting  . Methimazole Other (See Comments)  . Neomycin-Bacitracin Zn-Polymyx Other (See Comments)  . Neomycin-Bacitracin-Polymyxin  [Bacitracin-Neomycin-Polymyxin] Other (See Comments)  . Other Nausea And Vomiting, Nausea Only and Rash    All Mycins  . Pentazocine Nausea And Vomiting  . Pitavastatin Other (See Comments)    Headache.   . Salsalate Other (See Comments)  . Tetracyclines & Related Rash  . Azithromycin   . Crestor [Rosuvastatin Calcium] Nausea Only  . Elavil [Amitriptyline Hcl]   . Livalo [Pitavastatin Calcium] Other (See Comments)    Headache.   . Naproxen     nausea  . Neosporin [Neomycin-Polymyxin-Gramicidin]   .  Prozac [Fluoxetine]   . Rosuvastatin      If phone visit, billing and coding can please add appropriate modifier if needed

## 2021-01-13 MED ORDER — HYDROCODONE-ACETAMINOPHEN 10-325 MG PO TABS: 1.0000 | ORAL_TABLET | Freq: Three times a day (TID) | ORAL | 0 refills | Status: DC | PRN

## 2021-01-13 MED ORDER — HYDROCODONE-ACETAMINOPHEN 10-325 MG PO TABS: 1.0000 | ORAL_TABLET | Freq: Three times a day (TID) | ORAL | 0 refills | Status: AC | PRN

## 2021-02-13 ENCOUNTER — Other Ambulatory Visit: Payer: Self-pay | Admitting: Osteopathic Medicine

## 2021-02-13 DIAGNOSIS — M5030 Other cervical disc degeneration, unspecified cervical region: Secondary | ICD-10-CM

## 2021-02-23 ENCOUNTER — Telehealth: Payer: Self-pay | Admitting: Osteopathic Medicine

## 2021-02-23 NOTE — Chronic Care Management (AMB) (Signed)
  Chronic Care Management   Note  02/23/2021 Name: [name] MRN: [id number] DOB: [birth year]  [name] is a 87 y.o. year old female who is a primary care patient of Emeterio Reeve, DO. I reached out to Seward Meth by phone today in response to a referral sent by Ms. Kaisha J Hinesley's PCP, Emeterio Reeve, DO.   Ms. Preuss was given information about Chronic Care Management services today including:  1. CCM service includes personalized support from designated clinical staff supervised by her physician, including individualized plan of care and coordination with other care providers 2. 24/7 contact phone numbers for assistance for urgent and routine care needs. 3. Service will only be billed when office clinical staff spend 20 minutes or more in a month to coordinate care. 4. Only one practitioner may furnish and bill the service in a calendar month. 5. The patient may stop CCM services at any time (effective at the end of the month) by phone call to the office staff.   Patient agreed to services and verbal consent obtained.   Follow up plan:   Lauretta Grill [facility] Scheduler

## 2021-02-25 ENCOUNTER — Other Ambulatory Visit: Payer: Self-pay | Admitting: Osteopathic Medicine

## 2021-02-25 DIAGNOSIS — I739 Peripheral vascular disease, unspecified: Secondary | ICD-10-CM

## 2021-02-28 ENCOUNTER — Ambulatory Visit (INDEPENDENT_AMBULATORY_CARE_PROVIDER_SITE_OTHER): Payer: Medicare HMO | Admitting: Osteopathic Medicine

## 2021-02-28 DIAGNOSIS — Z780 Asymptomatic menopausal state: Secondary | ICD-10-CM

## 2021-02-28 DIAGNOSIS — Z0000 Encounter for general adult medical examination without abnormal findings: Secondary | ICD-10-CM | POA: Diagnosis not present

## 2021-02-28 NOTE — Progress Notes (Signed)
MEDICARE ANNUAL WELLNESS VISIT  02/28/2021  Telephone Visit Disclaimer This Medicare AWV was conducted by telephone due to national recommendations for restrictions regarding the COVID-19 Pandemic (e.g. social distancing).  I verified, using two identifiers, that I am speaking with Heather Perkins or their authorized healthcare agent. I discussed the limitations, risks, security, and privacy concerns of performing an evaluation and management service by telephone and the potential availability of an in-person appointment in the future. The patient expressed understanding and agreed to proceed.  Location of Patient: Home with daughter Location of Provider (nurse):  In the office.  Subjective:    Heather Perkins is a 87 y.o. female patient of Heather Nielsen, DO who had a Medicare Annual Wellness Visit today via telephone. [name] is Retired and lives with their daughter. she has 3 children. she reports that she is socially active and does interact with friends/family regularly. she is minimally physically active and enjoys crafts.  Patient Care Team: Heather Nielsen, DO as PCP - General (Osteopathic Medicine) Gabriel Carina, [facility] as Pharmacist (Pharmacist)  Advanced Directives 02/28/2021 08/05/2019 01/23/2014  Does Patient Have a Medical Advance Directive? Yes Yes Patient has advance directive, copy not in chart  Type of Advance Directive Healthcare Power of [place];Living will Healthcare Power of [place];Living will Healthcare Power of Attorney  Does patient want to make changes to medical advance directive? No - Patient declined No - Patient declined -  Copy of Healthcare Power of Attorney in Chart? Yes - validated most recent copy scanned in chart (See row information) No - copy requested Copy requested from family    Hospital Utilization Over the Past 12 Months: # of hospitalizations or ER visits: 0 # of surgeries: 0  Review of Systems    Patient reports that her overall health is  unchanged compared to last year.  History obtained from chart review and the patient  Patient Reported Readings (BP, Pulse, CBG, Weight, etc) none  Pain Assessment Pain : No/denies pain     Current Medications & Allergies (verified) Allergies as of 02/28/2021      Reactions   Amitriptyline Other (See Comments)   Amoxicillin-pot Clavulanate Nausea And Vomiting   Vomiting   Bupropion Other (See Comments)   constipation   Citalopram Hydrobromide Other (See Comments)   Codeine Nausea And Vomiting, Nausea Only   Erythromycin Nausea And Vomiting   Escitalopram Oxalate Nausea And Vomiting   Methimazole Other (See Comments)   Neomycin-bacitracin Zn-polymyx Other (See Comments)   Neomycin-bacitracin-polymyxin  [bacitracin-neomycin-polymyxin] Other (See Comments)   Other Nausea And Vomiting, Nausea Only, Rash   All Mycins   Pentazocine Nausea And Vomiting   Pitavastatin Other (See Comments)   Headache.    Salsalate Other (See Comments)   Tetracyclines & Related Rash   Azithromycin    Crestor [rosuvastatin Calcium] Nausea Only   Elavil [amitriptyline Hcl]    Livalo [pitavastatin Calcium] Other (See Comments)   Headache.    Naproxen    nausea   Neosporin [neomycin-polymyxin-gramicidin]    Prozac [fluoxetine]    Rosuvastatin       Medication List       Accurate as of Feb 28, 2021  4:00 PM. If you have any questions, ask your nurse or doctor.        acetaminophen 650 MG CR tablet Commonly known as: TYLENOL Take 1 tablet (650 mg total) by mouth every 8 (eight) hours as needed for pain.   albuterol 108 (90 Base) MCG/ACT inhaler Commonly  known as: VENTOLIN HFA Inhale 2 puffs into the lungs every 6 (six) hours as needed for wheezing or shortness of breath.   ALPRAZolam 0.5 MG tablet Commonly known as: XANAX Take 1 tablet (0.5 mg total) by mouth as needed for anxiety.   aspirin EC 81 MG tablet Take 1 tablet (81 mg total) by mouth daily.   B COMPLEX PO Take by mouth.    Calcium-Magnesium-Zinc 500-250-12.5 MG Tabs Take by mouth daily. TAKE 1 CAPSULE BY MOUTH 2 TIMES DAILY   cholecalciferol 25 MCG (1000 UNIT) tablet Commonly known as: VITAMIN D3 Take 1,000 Units by mouth daily.   cilostazol 100 MG tablet Commonly known as: PLETAL TAKE 1 TABLET(100 MG) BY MOUTH TWICE DAILY   cyclobenzaprine 10 MG tablet Commonly known as: FLEXERIL TAKE 1/2 TO 1 TABLET(5 TO 10 MG) BY MOUTH THREE TIMES DAILY AS NEEDED FOR MUSCLE SPASMS   Debrox 6.5 % OTIC solution Generic drug: carbamide peroxide Place 10 drops into both ears 2 (two) times daily. Dispense 1 bottle What changed: additional instructions   fish oil-omega-3 fatty acids 1000 MG capsule Take 2 g by mouth daily.   fluticasone 50 MCG/ACT nasal spray Commonly known as: FLONASE Place 2 sprays into both nostrils daily.   gabapentin 300 MG capsule Commonly known as: NEURONTIN TAKE 1 CAPSULE(300 MG) BY MOUTH TWICE DAILY AS NEEDED   HYDROcodone-acetaminophen 10-325 MG tablet Commonly known as: NORCO Take 1 tablet by mouth every 8 (eight) hours as needed for moderate pain or severe pain.   HYDROcodone-acetaminophen 10-325 MG tablet Commonly known as: NORCO Take 1 tablet by mouth every 8 (eight) hours as needed for moderate pain or severe pain. Start taking on: March 13, 2021   magnesium oxide 400 MG tablet Commonly known as: MAG-OX Take 2 tablets (800 mg total) by mouth at bedtime.   omeprazole 20 MG capsule Commonly known as: PRILOSEC TAKE 1 CAPSULE(20 MG) BY MOUTH TWICE DAILY BEFORE A MEAL   ondansetron 4 MG tablet Commonly known as: ZOFRAN Take 1-2 tablets (4-8 mg total) by mouth every 8 (eight) hours as needed for nausea or vomiting.   polyethylene glycol 17 g packet Commonly known as: MIRALAX / GLYCOLAX Take 17 g by mouth daily.   pravastatin 40 MG tablet Commonly known as: PRAVACHOL TAKE 1 TABLET(40 MG) BY MOUTH DAILY   promethazine 25 MG tablet Commonly known as: PHENERGAN Take by  mouth.   propylthiouracil 50 MG tablet Commonly known as: PTU TAKE 1 TABLET(50 MG) BY MOUTH TWICE DAILY   Spiriva Respimat 2.5 MCG/ACT Aers Generic drug: Tiotropium Bromide Monohydrate INHALE 2 PUFFS BY MOUTH DAILY TO HELP REDUCE COUGH       History (reviewed): Past Medical History:  Diagnosis Date  . Allergy   . Cancer ([place])    skin  . Colitis, ischemic ([place])   . Depression   . Fibromyalgia   . GERD (gastroesophageal reflux disease)   . Hyperthyroidism 2010   sees Dr Marcello Moores, did RAI  . Peripheral vascular disease ([place])   . Shingles    Past Surgical History:  Procedure Laterality Date  . ABDOMINAL HYSTERECTOMY  1975   for dub  . APPENDECTOMY  1939  . BREAST BIOPSY  1954  . [place]   right  . CATARACT EXTRACTION  2009   right and left.   . CERVICAL FUSION    . [place]  . ORIF ANKLE FRACTURE  1979   left  . TONSILLECTOMY  Family History  Problem Relation Age of Onset  . Hypertension Sister   . Cancer Daughter   . Hyperlipidemia Daughter    Social History   Socioeconomic History  . Marital status: Single    Spouse name: Not on file  . Number of children: 3  . Years of education: college  . Highest education level: 12th grade  Occupational History  . Occupation: retired    Comment: LPN  Tobacco Use  . Smoking status: Current Every Day Smoker    Packs/day: 1.00    Years: 50.00    Pack years: 50.00    Types: Cigarettes  . Smokeless tobacco: Never Used  . Tobacco comment: pt states that she has been trying to quit but can't seem to quit  Vaping Use  . Vaping Use: Never used  Substance and Sexual Activity  . Alcohol use: No  . Drug use: No  . Sexual activity: Not Currently  Other Topics Concern  . Not on file  Social History Narrative   Lives with her daughter, [name]. Pateint is hard of hearing   Social Determinants of Health   Financial Resource Strain: Low Risk   . Difficulty of Paying Living  Expenses: Not hard at all  Food Insecurity: No Food Insecurity  . Worried About Charity fundraiser in the Last Year: Never true  . Ran Out of Food in the Last Year: Never true  Transportation Needs: No Transportation Needs  . Lack of Transportation (Medical): No  . Lack of Transportation (Non-Medical): No  Physical Activity: Inactive  . Days of Exercise per Week: 0 days  . Minutes of Exercise per Session: 0 min  Stress: No Stress Concern Present  . Feeling of Stress : Only a little  Social Connections: Socially Isolated  . Frequency of Communication with Friends and Family: More than three times a week  . Frequency of Social Gatherings with Friends and Family: More than three times a week  . Attends Religious Services: Never  . Active Member of Clubs or Organizations: No  . Attends Archivist Meetings: Never  . Marital Status: Divorced    Activities of Daily Living In your present state of health, do you have any difficulty performing the following activities: 02/28/2021  Hearing? Y  Comment has hearing aids in both ears.  Vision? N  Difficulty concentrating or making decisions? Y  Comment sometimes.  Walking or climbing stairs? N  Dressing or bathing? N  Doing errands, shopping? Y  Comment she doesn't drive anymore so her daughter takes her to her appointments.  Preparing Food and eating ? N  Using the Toilet? N  In the past six months, have you accidently leaked urine? Y  Comment sometimes  Do you have problems with loss of bowel control? Y  Comment sometimes.  Managing your Medications? N  Managing your Finances? Y  Comment her daughter helps with that.  Housekeeping or managing your Housekeeping? Y  Comment her daughter helps with that  Some recent data might be hidden    Patient Education/ Literacy How often do you need to have someone help you when you read instructions, pamphlets, or other written materials from your doctor or pharmacy?: 1 - Never What  is the last grade level you completed in school?: 12th grade.  Exercise Current Exercise Habits: Home exercise routine, Type of exercise: walking, Time (Minutes): 30, Frequency (Times/Week): >7, Weekly Exercise (Minutes/Week): 0, Intensity: Mild, Exercise limited by: None identified  Diet Patient reports  consuming 2 meals a day and 4 snack(s) a day Patient reports that her primary diet is: Regular Patient reports that she does have regular access to food.   Depression Screen PHQ 2/9 Scores 02/28/2021 10/05/2020 08/05/2019 07/09/2018 11/15/2017 05/15/2017  PHQ - 2 Score 0 3 0 0 1 2  PHQ- 9 Score - - - - 6 -     Fall Risk Fall Risk  02/28/2021 10/05/2020 08/05/2019 07/09/2018 05/15/2017  Falls in the past year? 1 0 0 Yes No  Comment - - - fell backwards, no injury -  Number falls in past yr: 1 0 0 1 -  Injury with Fall? 0 - 0 No -  Risk for fall due to : No Fall Risks;Impaired balance/gait;History of fall(s) - - - -  Follow up Falls evaluation completed;Education provided;Falls prevention discussed - Falls prevention discussed Falls prevention discussed -     Objective:  Seward Meth seemed alert and oriented and she participated appropriately during our telephone visit.  Blood Pressure Weight BMI  BP Readings from Last 3 Encounters:  01/12/21 125/84  10/05/20 125/80  05/27/20 118/63   Wt Readings from Last 3 Encounters:  01/12/21 119 lb 1.4 oz (54 kg)  10/05/20 121 lb 9.6 oz (55.2 kg)  08/23/20 116 lb 4.8 oz (52.8 kg)   BMI Readings from Last 1 Encounters:  01/12/21 21.10 kg/m    *Unable to obtain current vital signs, weight, and BMI due to telephone visit type  Hearing/Vision  . Shirley did  seem to have difficulty with hearing/understanding during the telephone conversation . Reports that she has had a formal eye exam by an eye care professional within the past year . Reports that she has had a formal hearing evaluation within the past year *Unable to fully assess hearing and  vision during telephone visit type  Cognitive Function: 6CIT Screen 02/28/2021 08/05/2019 07/09/2018  What Year? 0 points 0 points 0 points  What month? 0 points 0 points 0 points  What time? 0 points 0 points 0 points  Count back from 20 0 points 0 points 0 points  Months in reverse 0 points 2 points 0 points  Repeat phrase 0 points 0 points 0 points  Total Score 0 2 0   (Normal:0-7, Significant for Dysfunction: >8)  Normal Cognitive Function Screening: Yes   Immunization & Health Maintenance Record Immunization History  Administered Date(s) Administered  . Influenza, High Dose Seasonal PF 08/15/2017  . Influenza,inj,Quad PF,6+ Mos 11/15/2016  . PFIZER(Purple Top)SARS-COV-2 Vaccination 01/03/2020, 01/24/2020, 01/08/2021  . Pneumococcal Conjugate-13 11/15/2016  . Pneumococcal Polysaccharide-23 10/09/2008  . Tdap 03/27/2012    Health Maintenance  Topic Date Due  . COVID-19 Vaccine (4 - Booster for Pfizer series) 04/09/2021  . INFLUENZA VACCINE  05/09/2021  . TETANUS/TDAP  03/27/2022  . DEXA SCAN  Completed  . PNA vac Low Risk Adult  Completed  . HPV VACCINES  Aged Out       Assessment  This is a routine wellness examination for [employer].  Health Maintenance: Due or Overdue There are no preventive care reminders to display for this patient.  Seward Meth does not need a referral for [employer] Assistance: Care Management:   no Social Work:    no Prescription Assistance:  no Nutrition/Diabetes Education:  no   Plan:  Personalized Goals Goals Addressed              This Visit's Progress   .  Patient Stated (pt-stated)  02/28/2021 AWV Goal: Improved Nutrition/Diet  . Patient will verbalize understanding that diet plays an important role in overall health and that a poor diet is a risk factor for many chronic medical conditions.  . Over the next year, patient will improve self management of their diet by incorporating better variety. . Patient will  utilize available community resources to help with food acquisition if needed (ex: food pantries, [address], etc) . Patient will work with nutrition specialist if a referral was made       Personalized Health Maintenance & Screening Recommendations  Bone densitometry screening Shingrix  Lung Cancer Screening Recommended: no (Low Dose CT Chest recommended if Age 52-80 years, 30 pack-year currently smoking OR have quit w/in past 15 years) Hepatitis C Screening recommended: no HIV Screening recommended: no  Advanced Directives: Written information was not prepared per patient's request.  Referrals & Orders Orders Placed This Encounter  Procedures  . [place]    Follow-up Plan . Follow-up with Emeterio Reeve, DO as planned . Schedule your shingrix vaccine at your pharmacy. . Dexa scan referral has been sent and they will call you to schedule.  . Medicare wellness visit in one year.   I have personally reviewed and noted the following in the patient's chart:   . Medical and social history . Use of alcohol, tobacco or illicit drugs  . Current medications and supplements . Functional ability and status . Nutritional status . Physical activity . Advanced directives . List of other physicians . Hospitalizations, surgeries, and ER visits in previous 12 months . Vitals . Screenings to include cognitive, depression, and falls . Referrals and appointments  In addition, I have reviewed and discussed with Seward Meth certain preventive protocols, quality metrics, and best practice recommendations. A written personalized care plan for preventive services as well as general preventive health recommendations is available and can be mailed to the patient at her request.      Tinnie Gens, RN  02/28/2021

## 2021-02-28 NOTE — Patient Instructions (Signed)
Health Maintenance, Female Adopting a healthy lifestyle and getting preventive care are important in promoting health and wellness. Ask your health care provider about:  The right schedule for you to have regular tests and exams.  Things you can do on your own to prevent diseases and keep yourself healthy. What should I know about diet, weight, and exercise? Eat a healthy diet  Eat a diet that includes plenty of vegetables, fruits, low-fat dairy products, and lean protein.  Do not eat a lot of foods that are high in solid fats, added sugars, or sodium.   Maintain a healthy weight Body mass index (BMI) is used to identify weight problems. It estimates body fat based on height and weight. Your health care provider can help determine your BMI and help you achieve or maintain a healthy weight. Get regular exercise Get regular exercise. This is one of the most important things you can do for your health. Most adults should:  Exercise for at least 150 minutes each week. The exercise should increase your heart rate and make you sweat (moderate-intensity exercise).  Do strengthening exercises at least twice a week. This is in addition to the moderate-intensity exercise.  Spend less time sitting. Even light physical activity can be beneficial. Watch cholesterol and blood lipids Have your blood tested for lipids and cholesterol at 85 years of age, then have this test every 5 years. Have your cholesterol levels checked more often if:  Your lipid or cholesterol levels are high.  You are older than 85 years of age.  You are at high risk for heart disease. What should I know about cancer screening? Depending on your health history and family history, you may need to have cancer screening at various ages. This may include screening for:  Breast cancer.  Cervical cancer.  Colorectal cancer.  Skin cancer.  Lung cancer. What should I know about heart disease, diabetes, and high blood  pressure? Blood pressure and heart disease  High blood pressure causes heart disease and increases the risk of stroke. This is more likely to develop in people who have high blood pressure readings, are of African descent, or are overweight.  Have your blood pressure checked: ? Every 3-5 years if you are 18-39 years of age. ? Every year if you are 40 years old or older. Diabetes Have regular diabetes screenings. This checks your fasting blood sugar level. Have the screening done:  Once every three years after age 40 if you are at a normal weight and have a low risk for diabetes.  More often and at a younger age if you are overweight or have a high risk for diabetes. What should I know about preventing infection? Hepatitis B If you have a higher risk for hepatitis B, you should be screened for this virus. Talk with your health care provider to find out if you are at risk for hepatitis B infection. Hepatitis C Testing is recommended for:  Everyone born from 1945 through 1965.  Anyone with known risk factors for hepatitis C. Sexually transmitted infections (STIs)  Get screened for STIs, including gonorrhea and chlamydia, if: ? You are sexually active and are younger than 85 years of age. ? You are older than 85 years of age and your health care provider tells you that you are at risk for this type of infection. ? Your sexual activity has changed since you were last screened, and you are at increased risk for chlamydia or gonorrhea. Ask your health care provider   if you are at risk.  Ask your health care provider about whether you are at high risk for HIV. Your health care provider may recommend a prescription medicine to help prevent HIV infection. If you choose to take medicine to prevent HIV, you should first get tested for HIV. You should then be tested every 3 months for as long as you are taking the medicine. Pregnancy  If you are about to stop having your period (premenopausal) and  you may become pregnant, seek counseling before you get pregnant.  Take 400 to 800 micrograms (mcg) of folic acid every day if you become pregnant.  Ask for birth control (contraception) if you want to prevent pregnancy. Osteoporosis and menopause Osteoporosis is a disease in which the bones lose minerals and strength with aging. This can result in bone fractures. If you are 65 years old or older, or if you are at risk for osteoporosis and fractures, ask your health care provider if you should:  Be screened for bone loss.  Take a calcium or vitamin D supplement to lower your risk of fractures.  Be given hormone replacement therapy (HRT) to treat symptoms of menopause. Follow these instructions at home: Lifestyle  Do not use any products that contain nicotine or tobacco, such as cigarettes, e-cigarettes, and chewing tobacco. If you need help quitting, ask your health care provider.  Do not use street drugs.  Do not share needles.  Ask your health care provider for help if you need support or information about quitting drugs. Alcohol use  Do not drink alcohol if: ? Your health care provider tells you not to drink. ? You are pregnant, may be pregnant, or are planning to become pregnant.  If you drink alcohol: ? Limit how much you use to 0-1 drink a day. ? Limit intake if you are breastfeeding.  Be aware of how much alcohol is in your drink. In the [place] one drink equals one 12 oz bottle of beer (355 mL), one 5 oz glass of wine (148 mL), or one 1 oz glass of hard liquor (44 mL). General instructions  Schedule regular health, dental, and eye exams.  Stay current with your vaccines.  Tell your health care provider if: ? You often feel depressed. ? You have ever been abused or do not feel safe at home. Summary  Adopting a healthy lifestyle and getting preventive care are important in promoting health and wellness.  Follow your health care provider's instructions about healthy  diet, exercising, and getting tested or screened for diseases.  Follow your health care provider's instructions on monitoring your cholesterol and blood pressure. This information is not intended to replace advice given to you by your health care provider. Make sure you discuss any questions you have with your health care provider. Document Revised: 09/18/2018 Document Reviewed: 09/18/2018 Elsevier Patient Education  2021 Elsevier Inc.  

## 2021-03-01 ENCOUNTER — Other Ambulatory Visit: Payer: Self-pay | Admitting: Osteopathic Medicine

## 2021-03-18 ENCOUNTER — Other Ambulatory Visit: Payer: Self-pay

## 2021-03-18 ENCOUNTER — Ambulatory Visit (INDEPENDENT_AMBULATORY_CARE_PROVIDER_SITE_OTHER): Payer: Medicare HMO | Admitting: Pharmacist

## 2021-03-18 DIAGNOSIS — E7800 Pure hypercholesterolemia, unspecified: Secondary | ICD-10-CM | POA: Diagnosis not present

## 2021-03-18 DIAGNOSIS — Z720 Tobacco use: Secondary | ICD-10-CM

## 2021-03-18 DIAGNOSIS — J449 Chronic obstructive pulmonary disease, unspecified: Secondary | ICD-10-CM

## 2021-03-18 NOTE — Patient Instructions (Signed)
Cholesterol Content in Foods Cholesterol is a waxy, fat-like substance that helps to carry fat in the blood. The body needs cholesterol in small amounts, but too much cholesterol can causedamage to the arteries and heart. Most people should eat less than 200 milligrams (mg) of cholesterol a day. Foods with cholesterol  Cholesterol is found in animal-based foods, such as meat, seafood, and dairy. Generally, low-fat dairy and lean meats have less cholesterol than full-fat dairy and fatty meats. The milligrams of cholesterol per serving (mg per serving) of common cholesterol-containing foods are listed below. Meat and other proteins Egg -- one large whole egg has 186 mg. Veal shank -- 4 oz has 141 mg. Lean ground turkey (93% lean) -- 4 oz has 118 mg. Fat-trimmed lamb loin -- 4 oz has 106 mg. Lean ground beef (90% lean) -- 4 oz has 100 mg. Lobster -- 3.5 oz has 90 mg. Pork loin chops -- 4 oz has 86 mg. Canned salmon -- 3.5 oz has 83 mg. Fat-trimmed beef top loin -- 4 oz has 78 mg. Frankfurter -- 1 frank (3.5 oz) has 77 mg. Crab -- 3.5 oz has 71 mg. Roasted chicken without skin, white meat -- 4 oz has 66 mg. Light bologna -- 2 oz has 45 mg. Deli-cut turkey -- 2 oz has 31 mg. Canned tuna -- 3.5 oz has 31 mg. Bacon -- 1 oz has 29 mg. Oysters and mussels (raw) -- 3.5 oz has 25 mg. Mackerel -- 1 oz has 22 mg. Trout -- 1 oz has 20 mg. Pork sausage -- 1 link (1 oz) has 17 mg. Salmon -- 1 oz has 16 mg. Tilapia -- 1 oz has 14 mg. Dairy Soft-serve ice cream --  cup (4 oz) has 103 mg. Whole-milk yogurt -- 1 cup (8 oz) has 29 mg. Cheddar cheese -- 1 oz has 28 mg. American cheese -- 1 oz has 28 mg. Whole milk -- 1 cup (8 oz) has 23 mg. 2% milk -- 1 cup (8 oz) has 18 mg. Cream cheese -- 1 tablespoon (Tbsp) has 15 mg. Cottage cheese --  cup (4 oz) has 14 mg. Low-fat (1%) milk -- 1 cup (8 oz) has 10 mg. Sour cream -- 1 Tbsp has 8.5 mg. Low-fat yogurt -- 1 cup (8 oz) has 8 mg. Nonfat Greek  yogurt -- 1 cup (8 oz) has 7 mg. Half-and-half cream -- 1 Tbsp has 5 mg. Fats and oils Cod liver oil -- 1 tablespoon (Tbsp) has 82 mg. Butter -- 1 Tbsp has 15 mg. Lard -- 1 Tbsp has 14 mg. Bacon grease -- 1 Tbsp has 14 mg. Mayonnaise -- 1 Tbsp has 5-10 mg. Margarine -- 1 Tbsp has 3-10 mg. Exact amounts of cholesterol in these foods may vary depending on specificingredients and brands. Foods without cholesterol Most plant-based foods do not have cholesterol unless you combine them with a food that has cholesterol. Foods without cholesterol include: Grains and cereals. Vegetables. Fruits. Vegetable oils, such as olive, canola, and sunflower oil. Legumes, such as peas, beans, and lentils. Nuts and seeds. Egg whites. Summary The body needs cholesterol in small amounts, but too much cholesterol can cause damage to the arteries and heart. Most people should eat less than 200 milligrams (mg) of cholesterol a day. This information is not intended to replace advice given to you by your health care provider. Make sure you discuss any questions you have with your healthcare provider. Document Revised: 01/06/2020 Document Reviewed: 02/16/2020 Elsevier Patient Education  2022   [name].  Visit Information   PATIENT GOALS:   Goals Addressed             This Visit's Progress    Medication Management       Patient Goals/Self-Care Activities Over the next 90 days, patient will:  take medications as prescribed  Follow Up Plan: Telephone follow up appointment with care management team member scheduled for:  3 months          Consent to CCM Services: Heather Perkins was given information about Chronic Care Management services today including:  CCM service includes personalized support from designated clinical staff supervised by her physician, including individualized plan of care and coordination with other care providers 24/7 contact phone numbers for assistance for urgent and routine  care needs. Service will only be billed when office clinical staff spend 20 minutes or more in a month to coordinate care. Only one practitioner may furnish and bill the service in a calendar month. The patient may stop CCM services at any time (effective at the end of the month) by phone call to the office staff. The patient will be responsible for cost sharing (co-pay) of up to 20% of the service fee (after annual deductible is met).  Patient agreed to services and verbal consent obtained.   Patient verbalizes understanding of instructions provided today and agrees to view in [place].   Telephone follow up appointment with care management team member scheduled for: 3 months [name]   CLINICAL CARE PLAN: Patient Care Plan: Medication Management     Problem Identified: HLD, COPD, Tobacco use      Long-Range Goal: Disease Progression Prevention   Start Date: 03/18/2021  This Visit's Progress: On track  Priority: High  Note:   Current Barriers:  None at present  Pharmacist Clinical Goal(s):  Over the next 90 days, patient will adhere to prescribed medication regimen as evidenced by medication fill history through collaboration with PharmD and provider.   Interventions: 1:1 collaboration with Heather Reeve, DO regarding development and update of comprehensive plan of care as evidenced by provider attestation and co-signature Inter-disciplinary care team collaboration (see longitudinal plan of care) Comprehensive medication review performed; medication list updated in electronic medical record  Hyperlipidemia:   Controlled; current treatment:pravastatin 29m;   Medications previously tried: rosuvastatin   Educated on options for lifestyle/diet modifications Recommended continue current therapy, recommend updated lipid panel  Chronic Obstructive Pulmonary Disease:  Controlled; current treatment:albuterol, spiriva;   Recommended continue current regimen, and    Tobacco Abuse:  1/2 packs per day; 66 years of use; does not smoke within 30 minutes of waking up  Previous quit attempts: unsuccessful, several attempts, longest quit was 3 months, chantix (made her sick), wellbutrin ("didn't agree w/body"), gum (didn't help)  Triggers to smoke: "whenever she thinks about it"  Motivation to quit smoking: not at this time  Educated on various modalities aside from previous medications if patient becomes ready to quit Recommended continue good work in cutting back! Recommended chewing/use of a drinking straw to assist w/hand-to-mouth motion which patient states is biggest barrier  Patient Goals/Self-Care Activities Over the next 90 days, patient will:  take medications as prescribed  Follow Up Plan: Telephone follow up appointment with care management team member scheduled for:  3 months

## 2021-03-18 NOTE — Progress Notes (Signed)
Chronic Care Management Pharmacy Note  03/18/2021 Name:  Heather Perkins MRN:  [id number] DOB:  [birth year]  Summary: addressed HLD, COPD, tobacco use  Recommendations/Changes made from today's visit: Recommend repeat lipid panel (last one 2019), and pt request spot check TSH.  Plan: f/u with pharmacist in 3 months  Subjective: Heather Perkins is an 87 y.o. year old female who is a primary patient of Emeterio Reeve, DO.  The CCM team was consulted for assistance with disease management and care coordination needs.    Engaged with patient by telephone for initial visit in response to provider referral for pharmacy case management and/or care coordination services.   Consent to Services:  The patient was given information about Chronic Care Management services, agreed to services, and gave verbal consent prior to initiation of services.  Please see initial visit note for detailed documentation.   Patient Care Team: Emeterio Reeve, DO as PCP - General (Osteopathic Medicine) [name], [facility] as Pharmacist (Pharmacist)   Objective:  Lab Results  Component Value Date   CREATININE 0.86 09/22/2020   CREATININE 0.99 (H) 03/11/2020   CREATININE 1.06 (H) 03/14/2019    Lab Results  Component Value Date   HGBA1C 5.4 01/26/2014      Component Value Date/Time   CHOL 184 02/20/2018 1517   TRIG 170 (H) 02/20/2018 1517   HDL 66 02/20/2018 1517   CHOLHDL 2.8 02/20/2018 1517   VLDL 25 11/15/2016 1400   LDLCALC 91 02/20/2018 1517    Hepatic Function Latest Ref Rng & Units 09/22/2020 03/11/2020 03/14/2019  Total Protein 6.1 - 8.1 g/dL 6.4 6.6 6.1  Albumin 3.6 - 5.1 g/dL - - -  AST 10 - 35 U/L _0 ALT 6 - 29 U/L _1 Alk Phosphatase 33 - 130 U/L - - -  Total Bilirubin 0.2 - 1.2 mg/dL 0.8 0.6 0.9    Lab Results  Component Value Date/Time   TSH 1.58 09/22/2020 12:00 AM   TSH 1.29 02/20/2018 03:17 PM   FREET4 1.3 09/22/2020 12:00 AM   FREET4 1.3 02/20/2018 03:17 PM     CBC Latest Ref Rng & Units 03/14/2019 10/24/2018 02/20/2018  WBC 3.8 - 10.8 Thousand/uL 21.1(H) 10.4 7.4  Hemoglobin 11.7 - 15.5 g/dL 13.2 13.6 14.7  Hematocrit 35.0 - 45.0 % 38.4 40.2 43.3  Platelets 140 - 400 Thousand/uL 291 273 297    Lab Results  Component Value Date/Time   VD25OH 20 (L) 02/20/2018 03:17 PM   VD25OH 34.9 05/26/2011 08:44 AM   Social History   Tobacco Use  Smoking Status Every Day   Packs/day: 1.00   Years: 50.00   Pack years: 50.00   Types: Cigarettes  Smokeless Tobacco Never  Tobacco Comments   pt states that she has been trying to quit but can't seem to quit   BP Readings from Last 3 Encounters:  01/12/21 125/84  10/05/20 125/80  05/27/20 118/63   Pulse Readings from Last 3 Encounters:  01/12/21 89  10/05/20 100  05/27/20 76   Wt Readings from Last 3 Encounters:  01/12/21 119 lb 1.4 oz (54 kg)  10/05/20 121 lb 9.6 oz (55.2 kg)  08/23/20 116 lb 4.8 oz (52.8 kg)    Assessment: Review of patient past medical history, allergies, medications, health status, including review of consultants reports, laboratory and other test data, was performed as part of comprehensive evaluation and provision of chronic care management services.   SDOH:  (Social Determinants of  Health) assessments and interventions performed:    CCM Care Plan  Allergies  Allergen Reactions   Amitriptyline Other (See Comments)   Amoxicillin-Pot Clavulanate Nausea And Vomiting    Vomiting    Bupropion Other (See Comments)    constipation    Citalopram Hydrobromide Other (See Comments)   Codeine Nausea And Vomiting and Nausea Only   Erythromycin Nausea And Vomiting   Escitalopram Oxalate Nausea And Vomiting   Methimazole Other (See Comments)   Neomycin-Bacitracin Zn-Polymyx Other (See Comments)   Neomycin-Bacitracin-Polymyxin  [Bacitracin-Neomycin-Polymyxin] Other (See Comments)   Other Nausea And Vomiting, Nausea Only and Rash    All Mycins   Pentazocine Nausea And  Vomiting   Pitavastatin Other (See Comments)    Headache.    Salsalate Other (See Comments)   Tetracyclines & Related Rash   Azithromycin    Crestor [Rosuvastatin Calcium] Nausea Only   Elavil [Amitriptyline Hcl]    Livalo [Pitavastatin Calcium] Other (See Comments)    Headache.    Naproxen     nausea   Neosporin [Neomycin-Polymyxin-Gramicidin]    Prozac [Fluoxetine]    Rosuvastatin     Medications Reviewed Today     Reviewed by Tinnie Gens, RN (Registered Nurse) on 02/28/21 at 1520  Med List Status: <None>   Medication Order Taking? Sig Documenting Provider Last Dose Status Informant  acetaminophen (TYLENOL) 650 MG CR tablet [id number] Yes Take 1 tablet (650 mg total) by mouth every 8 (eight) hours as needed for pain. Silverio Decamp, MD Taking Active   albuterol (VENTOLIN HFA) 108 (90 Base) MCG/ACT inhaler [id number] Yes Inhale 2 puffs into the lungs every 6 (six) hours as needed for wheezing or shortness of breath. Emeterio Reeve, DO Taking Active   ALPRAZolam Duanne Moron) 0.5 MG tablet [id number] Yes Take 1 tablet (0.5 mg total) by mouth as needed for anxiety. Emeterio Reeve, DO Taking Active   aspirin EC 81 MG tablet [id number] Yes Take 1 tablet (81 mg total) by mouth daily. Silverio Decamp, MD Taking Active   B Complex Vitamins (B COMPLEX PO) [id number] Yes Take by mouth. [provider] Taking Active   Calcium-Magnesium-Zinc 500-250-12.5 MG TABS [id number] Yes Take by mouth daily. TAKE 1 CAPSULE BY MOUTH 2 TIMES DAILY [provider] Taking Active   carbamide peroxide (DEBROX) 6.5 % OTIC solution [id number] Yes Place 10 drops into both ears 2 (two) times daily. Dispense 1 bottle  Patient taking differently: Place 10 drops into both ears 2 (two) times daily. Dispense 1 bottle  As needed   Emeterio Reeve, DO Taking Active   cholecalciferol (VITAMIN D3) 25 MCG (1000 UNIT) tablet [id number] Yes Take 1,000 Units by mouth daily. [provider] Taking Active   cilostazol (PLETAL) 100 MG tablet [id number] Yes TAKE 1 TABLET(100 MG) BY MOUTH TWICE DAILY Emeterio Reeve, DO Taking Active   cyclobenzaprine (FLEXERIL) 10 MG tablet [id number] Yes TAKE 1/2 TO 1 TABLET(5 TO 10 MG) BY MOUTH THREE TIMES DAILY AS NEEDED FOR MUSCLE SPASMS Emeterio Reeve, DO Taking Active   fish oil-omega-3 fatty acids 1000 MG capsule [id number] Yes Take 2 g by mouth daily. [provider] Taking Active   fluticasone (FLONASE) 50 MCG/ACT nasal spray [id number] Yes Place 2 sprays into both nostrils daily. Emeterio Reeve, DO Taking Active   gabapentin (NEURONTIN) 300 MG capsule [id number] No TAKE 1 CAPSULE(300 MG) BY MOUTH TWICE DAILY AS NEEDED  Patient not taking: Reported on 02/28/2021   Emeterio Reeve, DO Not Taking Active   HYDROcodone-acetaminophen [facility])  10-325 MG tablet [id number] Yes Take 1 tablet by mouth every 8 (eight) hours as needed for moderate pain or severe pain. Emeterio Reeve, DO Taking Active   HYDROcodone-acetaminophen [facility]) 10-325 MG tablet [id number] Yes Take 1 tablet by mouth every 8 (eight) hours as needed for moderate pain or severe pain. Emeterio Reeve, DO Taking Active   magnesium oxide (MAG-OX) 400 MG tablet [id number] No Take 2 tablets (800 mg total) by mouth at bedtime.  Patient not taking: Reported on 02/28/2021   Silverio Decamp, MD Not Taking Active   omeprazole (PRILOSEC) 20 MG capsule [id number] Yes TAKE 1 CAPSULE(20 MG) BY MOUTH TWICE DAILY BEFORE A MEAL Emeterio Reeve, DO Taking Active   ondansetron (ZOFRAN) 4 MG tablet [id number] Yes Take 1-2 tablets (4-8 mg total) by mouth every 8 (eight) hours as needed for nausea or vomiting. Emeterio Reeve, DO Taking Active   polyethylene glycol [facility] / GLYCOLAX) packet [id number] Yes Take 17 g by mouth daily. [provider] Taking Active            Med Note Jerilee Hoh, Colin Broach   Wed Feb 20, 2018  2:51 PM) As per pt, taking med qod  pravastatin  (PRAVACHOL) 40 MG tablet [id number] Yes TAKE 1 TABLET(40 MG) BY MOUTH DAILY Emeterio Reeve, DO Taking Active   promethazine (PHENERGAN) 25 MG tablet [id number] Yes Take by mouth. [provider] Taking Active   propylthiouracil (PTU) 50 MG tablet [id number] Yes TAKE 1 TABLET(50 MG) BY MOUTH TWICE DAILY Emeterio Reeve, DO Taking Active   Tiotropium Bromide Monohydrate ([place]) 2.5 MCG/ACT AERS [id number] Yes INHALE 2 PUFFS BY MOUTH DAILY TO HELP REDUCE COUGH Emeterio Reeve, DO Taking Active             Patient Active Problem List   Diagnosis Date Noted   Dehydration 03/14/2019   Coughing 03/14/2019   Myalgia 10/24/2018   Lumbar spinal stenosis 10/15/2018   Encounter for wellness examination in adult 07/09/2018   Dry mouth 04/24/2018   Dysphagia 04/24/2018   Osteoporosis 02/20/2018   Hyperlipidemia 02/23/2015   Hard of hearing 01/23/2014   COPD (chronic obstructive pulmonary disease) ([place]) 08/12/2013   Tobacco abuse 06/12/2013   Atherosclerosis of native arteries of extremity with intermittent claudication ([place]) 02/26/2013   Claudication of right lower extremity ([place]) 01/03/2013   Recurrent UTI 08/08/2012   Right hip pain 08/08/2012   GERD (gastroesophageal reflux disease) 06/16/2011   Hypercholesteremia 04/16/2011   Hyperthyroidism 03/27/2011   DDD (degenerative disc disease), cervical 03/27/2011   Bipolar disorder ([place]) 03/03/2011   Brachial neuritis 10/25/2010   Myalgia and myositis 10/25/2010   Peptic ulcer 07/14/2009   Anxiety state 12/03/2007   Dysthymic disorder 11/04/2007   Malignant neoplasm of skin 05/24/2007   Rosacea 05/24/2007   Glaucoma 05/24/2007   Herpes zoster 05/13/2007    Immunization History  Administered Date(s) Administered   Influenza, High Dose Seasonal PF 08/15/2017   Influenza,inj,Quad PF,6+ Mos 11/15/2016   PFIZER(Purple Top)SARS-COV-2 Vaccination 01/03/2020, 01/24/2020, 01/08/2021   Pneumococcal Conjugate-13  11/15/2016   Pneumococcal Polysaccharide-23 10/09/2008   Tdap 03/27/2012    Conditions to be addressed/monitored: HLD, COPD, and tobacco use  There are no care plans that you recently modified to display for this patient.   Medication Assistance: None required.  Patient affirms current coverage meets needs.  Patient's preferred pharmacy is:  [facility] [place], [place] - [address] AT [facility] [address] [place] [address]  Phone: [phone] Fax: [phone]  Uses pill box? Yes Pt endorses 100% compliance  Follow Up:  Patient agrees to Care Plan and Follow-up.  Plan: Telephone follow up appointment with care management team member scheduled for:  3 months  [name]

## 2021-03-23 ENCOUNTER — Other Ambulatory Visit: Payer: Medicare HMO

## 2021-04-06 ENCOUNTER — Other Ambulatory Visit: Payer: Self-pay

## 2021-04-06 ENCOUNTER — Ambulatory Visit: Payer: Medicare HMO

## 2021-04-06 DIAGNOSIS — M818 Other osteoporosis without current pathological fracture: Secondary | ICD-10-CM

## 2021-04-07 LAB — COMPLETE METABOLIC PANEL WITH GFR
AG Ratio: 1.8 (calc) (ref 1.0–2.5)
ALT: 10 U/L (ref 6–29)
AST: 18 U/L (ref 10–35)
Albumin: 4.5 g/dL (ref 3.6–5.1)
Alkaline phosphatase (APISO): 48 U/L (ref 37–153)
BUN: 15 mg/dL (ref 7–25)
CO2: 25 mmol/L (ref 20–32)
Calcium: 9.7 mg/dL (ref 8.6–10.4)
Chloride: 105 mmol/L (ref 98–110)
Creat: 0.88 mg/dL (ref 0.60–0.88)
GFR, Est African American: 69 mL/min/{1.73_m2} (ref >=60)
GFR, Est Non African American: 60 mL/min/{1.73_m2} (ref >=60)
Globulin: 2.5 g/dL (calc) (ref 1.9–3.7)
Glucose, Bld: 86 mg/dL (ref 65–99)
Potassium: 4.6 mmol/L (ref 3.5–5.3)
Sodium: 138 mmol/L (ref 135–146)
Total Bilirubin: 0.6 mg/dL (ref 0.2–1.2)
Total Protein: 7 g/dL (ref 6.1–8.1)

## 2021-04-15 DIAGNOSIS — H6040 Cholesteatoma of external ear, unspecified ear: Secondary | ICD-10-CM | POA: Diagnosis not present

## 2021-04-15 DIAGNOSIS — H938X3 Other specified disorders of ear, bilateral: Secondary | ICD-10-CM | POA: Diagnosis not present

## 2021-04-18 ENCOUNTER — Ambulatory Visit (INDEPENDENT_AMBULATORY_CARE_PROVIDER_SITE_OTHER): Payer: Medicare HMO | Admitting: Osteopathic Medicine

## 2021-04-18 ENCOUNTER — Other Ambulatory Visit: Payer: Self-pay

## 2021-04-18 VITALS — BP 118/73 | HR 96

## 2021-04-18 DIAGNOSIS — M818 Other osteoporosis without current pathological fracture: Secondary | ICD-10-CM | POA: Diagnosis not present

## 2021-04-18 MED ORDER — DENOSUMAB 60 MG/ML ~~LOC~~ SOSY
60.0000 mg | PREFILLED_SYRINGE | Freq: Once | SUBCUTANEOUS | Status: AC
  Administered 2021-04-18: 60 mg via SUBCUTANEOUS

## 2021-04-18 MED ORDER — ONDANSETRON HCL 4 MG PO TABS: 4.0000 mg | ORAL_TABLET | Freq: Three times a day (TID) | ORAL | 0 refills | Status: DC | PRN

## 2021-04-18 NOTE — Progress Notes (Signed)
Established Patient Office Visit  Subjective:  Patient ID: Heather Perkins, female    DOB: [birth year]  Age: 87 y.o. MRN: [id number]  CC:  Chief Complaint  Patient presents with   Osteoporosis    HPI Heather Perkins presents for Prolia injection. Prior authorization was extended to 10/08/2021. Approval number [id number].  Past Medical History:  Diagnosis Date   Allergy    Cancer ([place])    skin   Colitis, ischemic ([place])    Depression    Fibromyalgia    GERD (gastroesophageal reflux disease)    Hyperthyroidism 2010   sees Dr Marcello Moores, did RAI   Peripheral vascular disease ([place])    Shingles     Past Surgical History:  Procedure Laterality Date   ABDOMINAL HYSTERECTOMY  1975   for Heather Perkins   right   CATARACT EXTRACTION  2009   right and left.    CERVICAL FUSION     HEMORRHOID SURGERY  1969   ORIF ANKLE FRACTURE  1979   left   TONSILLECTOMY      Family History  Problem Relation Age of Onset   Hypertension Sister    Cancer Daughter    Hyperlipidemia Daughter     Social History   Socioeconomic History   Marital status: Single    Spouse name: Not on file   Number of children: 3   Years of education: college   Highest education level: 12th grade  Occupational History   Occupation: retired    Comment: LPN  Tobacco Use   Smoking status: Every Day    Packs/day: 1.00    Years: 50.00    Pack years: 50.00    Types: Cigarettes   Smokeless tobacco: Never   Tobacco comments:    pt states that she has been trying to quit but can't seem to quit  Vaping Use   Vaping Use: Never used  Substance and Sexual Activity   Alcohol use: No   Drug use: No   Sexual activity: Not Currently  Other Topics Concern   Not on file  Social History Narrative   Lives with her daughter, Heather Perkins. Pateint is hard of hearing   Social Determinants of Health   Financial Resource Strain: Low Risk    Difficulty of Paying  Living Expenses: Not hard at all  Food Insecurity: No Food Insecurity   Worried About Charity fundraiser in the Last Year: Never true   [place] in the Last Year: Never true  Transportation Needs: No Transportation Needs   Lack of Transportation (Medical): No   Lack of Transportation (Non-Medical): No  Physical Activity: Inactive   Days of Exercise per Week: 0 days   Minutes of Exercise per Session: 0 min  Stress: No Stress Concern Present   Feeling of Stress : Only a little  Social Connections: Socially Isolated   Frequency of Communication with Friends and Family: More than three times a week   Frequency of Social Gatherings with Friends and Family: More than three times a week   Attends Religious Services: Never   Marine scientist or Organizations: No   Attends Archivist Meetings: Never   Marital Status: Divorced  Human resources officer Violence: Not At Risk   Fear of Current or Ex-Partner: No   Emotionally Abused: No   Physically Abused: No   Sexually Abused: No  Outpatient Medications Prior to Visit  Medication Sig Dispense Refill   acetaminophen (TYLENOL) 650 MG CR tablet Take 1 tablet (650 mg total) by mouth every 8 (eight) hours as needed for pain. 90 tablet 3   albuterol (VENTOLIN HFA) 108 (90 Base) MCG/ACT inhaler Inhale 2 puffs into the lungs every 6 (six) hours as needed for wheezing or shortness of breath. 18 g 11   ALPRAZolam (XANAX) 0.5 MG tablet Take 1 tablet (0.5 mg total) by mouth as needed for anxiety. 30 tablet 1   aspirin EC 81 MG tablet Take 1 tablet (81 mg total) by mouth daily. 90 tablet 3   B Complex Vitamins (B COMPLEX PO) Take by mouth.     Calcium-Magnesium-Zinc 500-250-12.5 MG TABS Take by mouth daily. TAKE 1 CAPSULE BY MOUTH 2 TIMES DAILY     carbamide peroxide (DEBROX) 6.5 % OTIC solution Place 10 drops into both ears 2 (two) times daily. Dispense 1 bottle (Patient taking differently: Place 10 drops into both ears 2 (two) times  daily. Dispense 1 bottle  As needed) 15 mL 99   cholecalciferol (VITAMIN D3) 25 MCG (1000 UNIT) tablet Take 1,000 Units by mouth daily.     cilostazol (PLETAL) 100 MG tablet TAKE 1 TABLET(100 MG) BY MOUTH TWICE DAILY 180 tablet 1   cyclobenzaprine (FLEXERIL) 10 MG tablet TAKE 1/2 TO 1 TABLET(5 TO 10 MG) BY MOUTH THREE TIMES DAILY AS NEEDED FOR MUSCLE SPASMS 90 tablet 3   fish oil-omega-3 fatty acids 1000 MG capsule Take 2 g by mouth daily.     fluticasone (FLONASE) 50 MCG/ACT nasal spray SHAKE LIQUID AND USE 2 SPRAYS IN EACH NOSTRIL DAILY 16 g 11   gabapentin (NEURONTIN) 300 MG capsule TAKE 1 CAPSULE(300 MG) BY MOUTH TWICE DAILY AS NEEDED 180 capsule 1   magnesium oxide (MAG-OX) 400 MG tablet Take 2 tablets (800 mg total) by mouth at bedtime. 180 tablet 3   omeprazole (PRILOSEC) 20 MG capsule TAKE 1 CAPSULE(20 MG) BY MOUTH TWICE DAILY BEFORE A MEAL 180 capsule 2   polyethylene glycol (MIRALAX / GLYCOLAX) packet Take 17 g by mouth daily.     pravastatin (PRAVACHOL) 40 MG tablet TAKE 1 TABLET(40 MG) BY MOUTH DAILY 90 tablet 3   promethazine (PHENERGAN) 25 MG tablet Take by mouth.     propylthiouracil (PTU) 50 MG tablet TAKE 1 TABLET(50 MG) BY MOUTH TWICE DAILY 180 tablet 3   Tiotropium Bromide Monohydrate (SPIRIVA RESPIMAT) 2.5 MCG/ACT AERS INHALE 2 PUFFS BY MOUTH DAILY TO HELP REDUCE COUGH 4 g 2   ondansetron (ZOFRAN) 4 MG tablet Take 1-2 tablets (4-8 mg total) by mouth every 8 (eight) hours as needed for nausea or vomiting. 40 tablet 0   No facility-administered medications prior to visit.    Allergies  Allergen Reactions   Amitriptyline Other (See Comments)   Amoxicillin-Pot Clavulanate Nausea And Vomiting    Vomiting    Bupropion Other (See Comments)    constipation    Citalopram Hydrobromide Other (See Comments)   Codeine Nausea And Vomiting and Nausea Only   Erythromycin Nausea And Vomiting   Escitalopram Oxalate Nausea And Vomiting   Methimazole Other (See Comments)    Neomycin-Bacitracin Zn-Polymyx Other (See Comments)   Neomycin-Bacitracin-Polymyxin  [Bacitracin-Neomycin-Polymyxin] Other (See Comments)   Other Nausea And Vomiting, Nausea Only and Rash    All Mycins   Pentazocine Nausea And Vomiting   Pitavastatin Other (See Comments)    Headache.    Salsalate Other (See Comments)  Tetracyclines & Related Rash   Azithromycin    Crestor [Rosuvastatin Calcium] Nausea Only   Elavil [Amitriptyline Hcl]    Livalo [Pitavastatin Calcium] Other (See Comments)    Headache.    Naproxen     nausea   Neosporin [Neomycin-Polymyxin-Gramicidin]    Prozac [Fluoxetine]    Rosuvastatin     ROS Review of Systems    Objective:    Physical Exam  BP 118/73   Pulse 96   SpO2 96%  Wt Readings from Last 3 Encounters:  01/12/21 119 lb 1.4 oz (54 kg)  10/05/20 121 lb 9.6 oz (55.2 kg)  08/23/20 116 lb 4.8 oz (52.8 kg)     Health Maintenance Due  Topic Date Due   Zoster Vaccines- Shingrix (1 of 2) Never done   COVID-19 Vaccine (4 - Booster for Pfizer series) 04/09/2021    There are no preventive care reminders to display for this patient.  Lab Results  Component Value Date   TSH 1.58 09/22/2020   Lab Results  Component Value Date   WBC 21.1 (H) 03/14/2019   HGB 13.2 03/14/2019   HCT 38.4 03/14/2019   MCV 90.6 03/14/2019   PLT 291 03/14/2019   Lab Results  Component Value Date   NA 138 04/06/2021   K 4.6 04/06/2021   CO2 25 04/06/2021   GLUCOSE 86 04/06/2021   BUN 15 04/06/2021   CREATININE 0.88 04/06/2021   BILITOT 0.6 04/06/2021   ALKPHOS 67 02/08/2017   AST 18 04/06/2021   ALT 10 04/06/2021   PROT 7.0 04/06/2021   ALBUMIN 4.6 02/08/2017   CALCIUM 9.7 04/06/2021   Lab Results  Component Value Date   CHOL 184 02/20/2018   Lab Results  Component Value Date   HDL 66 02/20/2018   Lab Results  Component Value Date   LDLCALC 91 02/20/2018   Lab Results  Component Value Date   TRIG 170 (H) 02/20/2018   Lab Results   Component Value Date   CHOLHDL 2.8 02/20/2018   Lab Results  Component Value Date   HGBA1C 5.4 01/26/2014      Assessment & Plan:  Patient tolerated injection well without complications. Patient advised to schedule next injection 6 months from today.     Problem List Items Addressed This Visit     Osteoporosis - Primary    Meds ordered this encounter  Medications   ondansetron (ZOFRAN) 4 MG tablet    Sig: Take 1-2 tablets (4-8 mg total) by mouth every 8 (eight) hours as needed for nausea or vomiting.    Dispense:  40 tablet    Refill:  0   denosumab (PROLIA) injection 60 mg    Order Specific Question:   Patient is enrolled in REMS program for this medication and I have provided a copy of the Prolia Medication Guide and Patient Brochure.    Answer:   No    Order Specific Question:   I have reviewed with the patient the information in the Prolia Medication Guide and Patient Counseling Chart including the serious risks of Prolia and symptoms of each risk.    Answer:   Yes    Order Specific Question:   I have advised the patient to seek medical attention if they have signs or symptoms of any of the serious risks.    Answer:   Yes    Follow-up: Return in about 6 months (around 10/19/2021) for Prolia injection. Durene Romans, Monico Blitz, [place]

## 2021-04-28 ENCOUNTER — Other Ambulatory Visit: Payer: Self-pay

## 2021-04-28 DIAGNOSIS — R52 Pain, unspecified: Secondary | ICD-10-CM

## 2021-04-28 MED ORDER — HYDROCODONE-ACETAMINOPHEN 10-325 MG PO TABS: 1.0000 | ORAL_TABLET | Freq: Three times a day (TID) | ORAL | 0 refills | Status: DC | PRN

## 2021-04-28 NOTE — Progress Notes (Signed)
Pt's daughter Sophronia Varney called and asked for a refill on [facility]. Last refill 01/12/21.

## 2021-04-28 NOTE — Progress Notes (Signed)
Reviewed chart Last visit for pain mgmt with PCP 01/12/21 Pain contract on file dated 05/2020 No UDS on file PDMP reviewed - no red flags Refill provided for 30 day supply

## 2021-04-30 ENCOUNTER — Other Ambulatory Visit: Payer: Self-pay | Admitting: Osteopathic Medicine

## 2021-05-11 ENCOUNTER — Ambulatory Visit: Payer: Medicare HMO | Admitting: Medical-Surgical

## 2021-06-07 ENCOUNTER — Other Ambulatory Visit: Payer: Self-pay | Admitting: *Deleted

## 2021-06-07 DIAGNOSIS — R52 Pain, unspecified: Secondary | ICD-10-CM

## 2021-06-10 ENCOUNTER — Telehealth: Payer: Self-pay

## 2021-06-10 DIAGNOSIS — R52 Pain, unspecified: Secondary | ICD-10-CM

## 2021-06-10 MED ORDER — HYDROCODONE-ACETAMINOPHEN 10-325 MG PO TABS: 1.0000 | ORAL_TABLET | Freq: Three times a day (TID) | ORAL | 0 refills | Status: DC | PRN

## 2021-06-10 NOTE — Telephone Encounter (Signed)
Patient's daughter called requesting med refill for hydrocodone. Rx not listed in active med list.

## 2021-06-10 NOTE — Telephone Encounter (Signed)
Patient will call back when she is ready.

## 2021-06-10 NOTE — Telephone Encounter (Signed)
Prescription sent, FYI patient should be coming to see me every 3 months per [facility] policy for continuation of opiate pain medications long-term, since I am going to be leaving practice here in the next month, can we see if patient and her daughter can come into the office 1 more time to see me and then we can set her up with Joy or Dr. Zigmund Daniel to continue medications?  Thanks!

## 2021-06-13 ENCOUNTER — Other Ambulatory Visit: Payer: Self-pay | Admitting: Osteopathic Medicine

## 2021-06-13 DIAGNOSIS — J449 Chronic obstructive pulmonary disease, unspecified: Secondary | ICD-10-CM

## 2021-06-24 ENCOUNTER — Ambulatory Visit (INDEPENDENT_AMBULATORY_CARE_PROVIDER_SITE_OTHER): Payer: Medicare HMO | Admitting: Pharmacist

## 2021-06-24 ENCOUNTER — Other Ambulatory Visit: Payer: Self-pay

## 2021-06-24 DIAGNOSIS — Z720 Tobacco use: Secondary | ICD-10-CM

## 2021-06-24 DIAGNOSIS — J449 Chronic obstructive pulmonary disease, unspecified: Secondary | ICD-10-CM

## 2021-06-24 DIAGNOSIS — E7800 Pure hypercholesterolemia, unspecified: Secondary | ICD-10-CM

## 2021-06-24 NOTE — Progress Notes (Signed)
Chronic Care Management Pharmacy Note  06/26/2021 Name:  Heather Perkins MRN:  [id number] DOB:  09-08-1934  Summary: addressed HLD, COPD, tobacco use  Recommendations/Changes made from today's visit: Recommend repeat lipid panel at next PCP visit (last one 2019) to assess medication therapy.  Plan: f/u with pharmacist in 3 months  Subjective: LARRI BREWTON is an 87 y.o. year old female who is a primary patient of Emeterio Reeve, DO.  The CCM team was consulted for assistance with disease management and care coordination needs.    Engaged with patient by telephone for initial visit in response to provider referral for pharmacy case management and/or care coordination services.   Consent to Services:  The patient was given information about Chronic Care Management services, agreed to services, and gave verbal consent prior to initiation of services.  Please see initial visit note for detailed documentation.   Patient Care Team: Emeterio Reeve, DO as PCP - General (Osteopathic Medicine) [name], [facility] as Pharmacist (Pharmacist)   Objective:  Lab Results  Component Value Date   CREATININE 0.88 04/06/2021   CREATININE 0.86 09/22/2020   CREATININE 0.99 (H) 03/11/2020    Lab Results  Component Value Date   HGBA1C 5.4 01/26/2014      Component Value Date/Time   CHOL 184 02/20/2018 1517   TRIG 170 (H) 02/20/2018 1517   HDL 66 02/20/2018 1517   CHOLHDL 2.8 02/20/2018 1517   VLDL 25 11/15/2016 1400   LDLCALC 91 02/20/2018 1517    Hepatic Function Latest Ref Rng & Units 04/06/2021 09/22/2020 03/11/2020  Total Protein 6.1 - 8.1 g/dL 7.0 6.4 6.6  Albumin 3.6 - 5.1 g/dL - - -  AST 10 - 35 U/L _0 ALT 6 - 29 U/L _1 Alk Phosphatase 33 - 130 U/L - - -  Total Bilirubin 0.2 - 1.2 mg/dL 0.6 0.8 0.6    Lab Results  Component Value Date/Time   TSH 1.58 09/22/2020 12:00 AM   TSH 1.29 02/20/2018 03:17 PM   FREET4 1.3 09/22/2020 12:00 AM   FREET4 1.3 02/20/2018  03:17 PM    CBC Latest Ref Rng & Units 03/14/2019 10/24/2018 02/20/2018  WBC 3.8 - 10.8 Thousand/uL 21.1(H) 10.4 7.4  Hemoglobin 11.7 - 15.5 g/dL 13.2 13.6 14.7  Hematocrit 35.0 - 45.0 % 38.4 40.2 43.3  Platelets 140 - 400 Thousand/uL 291 273 297    Lab Results  Component Value Date/Time   VD25OH 20 (L) 02/20/2018 03:17 PM   VD25OH 34.9 05/26/2011 08:44 AM   Social History   Tobacco Use  Smoking Status Every Day   Packs/day: 1.00   Years: 50.00   Pack years: 50.00   Types: Cigarettes  Smokeless Tobacco Never  Tobacco Comments   pt states that she has been trying to quit but can't seem to quit   BP Readings from Last 3 Encounters:  04/18/21 118/73  01/12/21 125/84  10/05/20 125/80   Pulse Readings from Last 3 Encounters:  04/18/21 96  01/12/21 89  10/05/20 100   Wt Readings from Last 3 Encounters:  01/12/21 119 lb 1.4 oz (54 kg)  10/05/20 121 lb 9.6 oz (55.2 kg)  08/23/20 116 lb 4.8 oz (52.8 kg)    Assessment: Review of patient past medical history, allergies, medications, health status, including review of consultants reports, laboratory and other test data, was performed as part of comprehensive evaluation and provision of chronic care management services.   SDOH:  (Social Determinants  of Health) assessments and interventions performed:    CCM Care Plan  Allergies  Allergen Reactions   Amitriptyline Other (See Comments)   Amoxicillin-Pot Clavulanate Nausea And Vomiting    Vomiting    Bupropion Other (See Comments)    constipation    Citalopram Hydrobromide Other (See Comments)   Codeine Nausea And Vomiting and Nausea Only   Erythromycin Nausea And Vomiting   Escitalopram Oxalate Nausea And Vomiting   Methimazole Other (See Comments)   Neomycin-Bacitracin Zn-Polymyx Other (See Comments)   Neomycin-Bacitracin-Polymyxin  [Bacitracin-Neomycin-Polymyxin] Other (See Comments)   Other Nausea And Vomiting, Nausea Only and Rash    All Mycins   Pentazocine  Nausea And Vomiting   Pitavastatin Other (See Comments)    Headache.    Salsalate Other (See Comments)   Tetracyclines & Related Rash   Azithromycin    Crestor [Rosuvastatin Calcium] Nausea Only   Elavil [Amitriptyline Hcl]    Livalo [Pitavastatin Calcium] Other (See Comments)    Headache.    Naproxen     nausea   Neosporin [Neomycin-Polymyxin-Gramicidin]    Prozac [Fluoxetine]    Rosuvastatin     Medications Reviewed Today     Reviewed by [name], [facility] (Pharmacist) on 06/24/21 at 1411  Med List Status: <None>   Medication Order Taking? Sig Documenting Provider Last Dose Status Informant  acetaminophen (TYLENOL) 650 MG CR tablet [id number] Yes Take 1 tablet (650 mg total) by mouth every 8 (eight) hours as needed for pain. Silverio Decamp, MD Taking Active   albuterol (VENTOLIN HFA) 108 (90 Base) MCG/ACT inhaler [id number] Yes Inhale 2 puffs into the lungs every 6 (six) hours as needed for wheezing or shortness of breath. Emeterio Reeve, DO Taking Active   ALPRAZolam Duanne Moron) 0.5 MG tablet [id number] Yes Take 1 tablet (0.5 mg total) by mouth as needed for anxiety. Emeterio Reeve, DO Taking Active   aspirin EC 81 MG tablet [id number] Yes Take 1 tablet (81 mg total) by mouth daily. Silverio Decamp, MD Taking Active   B Complex Vitamins (B COMPLEX PO) [id number] Yes Take by mouth. [provider] Taking Active   Calcium-Magnesium-Zinc 500-250-12.5 MG TABS [id number] Yes Take by mouth daily. TAKE 1 CAPSULE BY MOUTH 2 TIMES DAILY [provider] Taking Active   carbamide peroxide (DEBROX) 6.5 % OTIC solution [id number] Yes Place 10 drops into both ears 2 (two) times daily. Dispense 1 bottle  Patient taking differently: Place 10 drops into both ears 2 (two) times daily. Dispense 1 bottle  As needed   Emeterio Reeve, DO Taking Active   cholecalciferol (VITAMIN D3) 25 MCG (1000 UNIT) tablet [id number] Yes Take 1,000 Units by mouth daily. [provider] Taking Active   cilostazol (PLETAL) 100 MG tablet [id number] Yes TAKE 1 TABLET(100 MG) BY MOUTH TWICE DAILY Emeterio Reeve, DO Taking Active   cyclobenzaprine (FLEXERIL) 10 MG tablet [id number] Yes TAKE 1/2 TO 1 TABLET(5 TO 10 MG) BY MOUTH THREE TIMES DAILY AS NEEDED FOR MUSCLE SPASMS Emeterio Reeve, DO Taking Active   fish oil-omega-3 fatty acids 1000 MG capsule [id number] Yes Take 2 g by mouth daily. [provider] Taking Active   fluticasone (FLONASE) 50 MCG/ACT nasal spray [id number] Yes SHAKE LIQUID AND USE 2 SPRAYS IN EACH NOSTRIL DAILY Emeterio Reeve, DO Taking Active   gabapentin (NEURONTIN) 300 MG capsule [id number] Yes TAKE 1 CAPSULE(300 MG) BY MOUTH TWICE DAILY AS NEEDED Emeterio Reeve, DO Taking Active   HYDROcodone-acetaminophen [facility]) 10-325 MG tablet [id number] Yes Take  1 tablet by mouth every 8 (eight) hours as needed for moderate pain or severe pain. Emeterio Reeve, DO Taking Active   magnesium oxide (MAG-OX) 400 MG tablet [id number] Yes Take 2 tablets (800 mg total) by mouth at bedtime. Silverio Decamp, MD Taking Active   omeprazole (PRILOSEC) 20 MG capsule [id number] Yes TAKE 1 CAPSULE(20 MG) BY MOUTH TWICE DAILY BEFORE A MEAL Emeterio Reeve, DO Taking Active   ondansetron (ZOFRAN) 4 MG tablet [id number] Yes Take 1-2 tablets (4-8 mg total) by mouth every 8 (eight) hours as needed for nausea or vomiting. Emeterio Reeve, DO Taking Active   polyethylene glycol [facility] / GLYCOLAX) packet [id number] Yes Take 17 g by mouth daily. [provider] Taking Active            Med Note Jerilee Hoh, Colin Broach   Wed Feb 20, 2018  2:51 PM) As per pt, taking med qod  pravastatin (PRAVACHOL) 40 MG tablet [id number] Yes TAKE 1 TABLET(40 MG) BY MOUTH DAILY Emeterio Reeve, DO Taking Active   promethazine (PHENERGAN) 25 MG tablet [id number] Yes Take by mouth. [provider] Taking Active   propylthiouracil (PTU) 50 MG tablet [id number]  Yes TAKE 1 TABLET(50 MG) BY MOUTH TWICE DAILY Emeterio Reeve, DO Taking Active   Tiotropium Bromide Monohydrate ([place]) 2.5 MCG/ACT AERS [id number] Yes INHALE 2 PUFFS INTO THE LUNGS DAILY TO HELP REDUCE COUGH Emeterio Reeve, DO Taking Active             Patient Active Problem List   Diagnosis Date Noted   Dehydration 03/14/2019   Coughing 03/14/2019   Myalgia 10/24/2018   Lumbar spinal stenosis 10/15/2018   Encounter for wellness examination in adult 07/09/2018   Dry mouth 04/24/2018   Dysphagia 04/24/2018   Osteoporosis 02/20/2018   Hyperlipidemia 02/23/2015   Hard of hearing 01/23/2014   COPD (chronic obstructive pulmonary disease) ([place]) 08/12/2013   Tobacco abuse 06/12/2013   Atherosclerosis of native arteries of extremity with intermittent claudication ([place]) 02/26/2013   Claudication of right lower extremity ([place]) 01/03/2013   Recurrent UTI 08/08/2012   Right hip pain 08/08/2012   GERD (gastroesophageal reflux disease) 06/16/2011   Hypercholesteremia 04/16/2011   Hyperthyroidism 03/27/2011   DDD (degenerative disc disease), cervical 03/27/2011   Bipolar disorder ([place]) 03/03/2011   Brachial neuritis 10/25/2010   Myalgia and myositis 10/25/2010   Peptic ulcer 07/14/2009   Anxiety state 12/03/2007   Dysthymic disorder 11/04/2007   Malignant neoplasm of skin 05/24/2007   Rosacea 05/24/2007   Glaucoma 05/24/2007   Herpes zoster 05/13/2007    Immunization History  Administered Date(s) Administered   Influenza, High Dose Seasonal PF 08/15/2017   Influenza,inj,Quad PF,6+ Mos 11/15/2016   PFIZER(Purple Top)SARS-COV-2 Vaccination 01/03/2020, 01/24/2020, 01/08/2021   Pneumococcal Conjugate-13 11/15/2016   Pneumococcal Polysaccharide-23 10/09/2008   Tdap 03/27/2012    Conditions to be addressed/monitored: HLD, COPD, and tobacco use  Care Plan : Medication Management  Updates made by [name], [place] since 06/26/2021 12:00 AM     Problem: HLD,  COPD, Tobacco use      Long-Range Goal: Disease Progression Prevention   Start Date: 03/18/2021  Recent Progress: On track  Priority: High  Note:   Current Barriers:  None at present  Pharmacist Clinical Goal(s):  Over the next 90 days, patient will adhere to prescribed medication regimen as evidenced by medication fill history through collaboration with PharmD and provider.   Interventions: 1:1 collaboration with Emeterio Reeve, DO regarding development and update of comprehensive plan  of care as evidenced by provider attestation and co-signature Inter-disciplinary care team collaboration (see longitudinal plan of care) Comprehensive medication review performed; medication list updated in electronic medical record  Hyperlipidemia:   Controlled; current treatment:pravastatin 85m;   Medications previously tried: rosuvastatin   Educated on options for lifestyle/diet modifications Recommended continue current therapy, recommend updated lipid panel  Chronic Obstructive Pulmonary Disease:  Controlled; current treatment:albuterol, spiriva;   Recommended continue current regimen, and   Tobacco Abuse:  1/2 packs per day; 66 years of use; does not smoke within 30 minutes of waking up  Previous quit attempts: unsuccessful, several attempts, longest quit was 3 months, chantix (made her sick), wellbutrin ("didn't agree w/body"), gum (didn't help)  Triggers to smoke: "whenever she thinks about it"  Motivation to quit smoking: not at this time   Educated on various modalities aside from previous medications if patient becomes ready to quit Recommended continue good work in cutting back! Recommended chewing/use of a drinking straw to assist w/hand-to-mouth motion which patient states is biggest barrier  Patient Goals/Self-Care Activities Over the next 90 days, patient will:  take medications as prescribed  Follow Up Plan: Telephone follow up appointment with care management team member  scheduled for:  3 months       Medication Assistance: None required.  Patient affirms current coverage meets needs.  [facility] [address] [address]Phone: [phone]: [phone] Uses pill box? Yes Pt endorses 100% compliance  Follow Up:  Patient agrees to Care Plan and Follow-up.  Plan: Telephone follow up appointment with care management team member scheduled for:  3 months  KDarius Bump

## 2021-06-26 NOTE — Patient Instructions (Signed)
Visit Information  PATIENT GOALS:  Goals Addressed             This Visit's Progress    Medication Management       Patient Goals/Self-Care Activities Over the next 90 days, patient will:  take medications as prescribed  Follow Up Plan: Telephone follow up appointment with care management team member scheduled for:  3 months           Patient verbalizes understanding of instructions provided today and agrees to view in [place].   Telephone follow up appointment with care management team member scheduled for: 3 months  [name]

## 2021-07-08 DIAGNOSIS — J449 Chronic obstructive pulmonary disease, unspecified: Secondary | ICD-10-CM | POA: Diagnosis not present

## 2021-07-08 DIAGNOSIS — E7800 Pure hypercholesterolemia, unspecified: Secondary | ICD-10-CM

## 2021-07-13 ENCOUNTER — Ambulatory Visit: Payer: Medicare HMO | Admitting: Family Medicine

## 2021-07-20 DIAGNOSIS — H6040 Cholesteatoma of external ear, unspecified ear: Secondary | ICD-10-CM | POA: Diagnosis not present

## 2021-07-20 DIAGNOSIS — H9203 Otalgia, bilateral: Secondary | ICD-10-CM | POA: Diagnosis not present

## 2021-07-21 ENCOUNTER — Other Ambulatory Visit: Payer: Self-pay

## 2021-07-21 DIAGNOSIS — E0590 Thyrotoxicosis, unspecified without thyrotoxic crisis or storm: Secondary | ICD-10-CM

## 2021-07-21 MED ORDER — PROPYLTHIOURACIL 50 MG PO TABS: ORAL_TABLET | ORAL | 1 refills | Status: DC

## 2021-08-10 ENCOUNTER — Ambulatory Visit (INDEPENDENT_AMBULATORY_CARE_PROVIDER_SITE_OTHER): Payer: Medicare HMO | Admitting: Family Medicine

## 2021-08-10 ENCOUNTER — Encounter: Payer: Self-pay | Admitting: Family Medicine

## 2021-08-10 ENCOUNTER — Other Ambulatory Visit: Payer: Self-pay

## 2021-08-10 VITALS — BP 142/80 | HR 97 | Temp 97.9°F | Ht 63.0 in | Wt 121.0 lb

## 2021-08-10 DIAGNOSIS — F341 Dysthymic disorder: Secondary | ICD-10-CM

## 2021-08-10 DIAGNOSIS — M25551 Pain in right hip: Secondary | ICD-10-CM

## 2021-08-10 DIAGNOSIS — Z720 Tobacco use: Secondary | ICD-10-CM

## 2021-08-10 DIAGNOSIS — R52 Pain, unspecified: Secondary | ICD-10-CM | POA: Diagnosis not present

## 2021-08-10 DIAGNOSIS — J449 Chronic obstructive pulmonary disease, unspecified: Secondary | ICD-10-CM | POA: Diagnosis not present

## 2021-08-10 DIAGNOSIS — E782 Mixed hyperlipidemia: Secondary | ICD-10-CM

## 2021-08-10 DIAGNOSIS — F1190 Opioid use, unspecified, uncomplicated: Secondary | ICD-10-CM

## 2021-08-10 DIAGNOSIS — R300 Dysuria: Secondary | ICD-10-CM | POA: Diagnosis not present

## 2021-08-10 DIAGNOSIS — M48062 Spinal stenosis, lumbar region with neurogenic claudication: Secondary | ICD-10-CM

## 2021-08-10 DIAGNOSIS — Z79899 Other long term (current) drug therapy: Secondary | ICD-10-CM | POA: Diagnosis not present

## 2021-08-10 DIAGNOSIS — E0590 Thyrotoxicosis, unspecified without thyrotoxic crisis or storm: Secondary | ICD-10-CM | POA: Diagnosis not present

## 2021-08-10 MED ORDER — HYDROCODONE-ACETAMINOPHEN 10-325 MG PO TABS: 1.0000 | ORAL_TABLET | Freq: Three times a day (TID) | ORAL | 0 refills | Status: DC | PRN

## 2021-08-10 NOTE — Patient Instructions (Signed)
Nice to meet you today! We'll continue current medications. See me again in about 4 months.

## 2021-08-11 ENCOUNTER — Encounter: Payer: Self-pay | Admitting: Family Medicine

## 2021-08-11 DIAGNOSIS — L578 Other skin changes due to chronic exposure to nonionizing radiation: Secondary | ICD-10-CM | POA: Diagnosis not present

## 2021-08-11 DIAGNOSIS — C44319 Basal cell carcinoma of skin of other parts of face: Secondary | ICD-10-CM | POA: Diagnosis not present

## 2021-08-11 DIAGNOSIS — L570 Actinic keratosis: Secondary | ICD-10-CM | POA: Diagnosis not present

## 2021-08-11 DIAGNOSIS — L821 Other seborrheic keratosis: Secondary | ICD-10-CM | POA: Diagnosis not present

## 2021-08-11 DIAGNOSIS — D225 Melanocytic nevi of trunk: Secondary | ICD-10-CM | POA: Diagnosis not present

## 2021-08-11 DIAGNOSIS — L814 Other melanin hyperpigmentation: Secondary | ICD-10-CM | POA: Diagnosis not present

## 2021-08-11 LAB — COMPLETE METABOLIC PANEL WITH GFR
AG Ratio: 1.6 (calc) (ref 1.0–2.5)
ALT: 10 U/L (ref 6–29)
AST: 16 U/L (ref 10–35)
Albumin: 4.6 g/dL (ref 3.6–5.1)
Alkaline phosphatase (APISO): 51 U/L (ref 37–153)
BUN/Creatinine Ratio: 12 (calc) (ref 6–22)
BUN: 12 mg/dL (ref 7–25)
CO2: 25 mmol/L (ref 20–32)
Calcium: 9.7 mg/dL (ref 8.6–10.4)
Chloride: 102 mmol/L (ref 98–110)
Creat: 1 mg/dL — ABNORMAL HIGH (ref 0.60–0.95)
Globulin: 2.8 g/dL (calc) (ref 1.9–3.7)
Glucose, Bld: 101 mg/dL — ABNORMAL HIGH (ref 65–99)
Potassium: 4.4 mmol/L (ref 3.5–5.3)
Sodium: 136 mmol/L (ref 135–146)
Total Bilirubin: 0.6 mg/dL (ref 0.2–1.2)
Total Protein: 7.4 g/dL (ref 6.1–8.1)
eGFR: 55 mL/min/{1.73_m2} — ABNORMAL LOW (ref >=60)

## 2021-08-11 LAB — CBC WITH DIFFERENTIAL/PLATELET
Absolute Monocytes: 421 cells/uL (ref 200–950)
Basophils Absolute: 28 cells/uL (ref 0–200)
Basophils Relative: 0.4 %
Eosinophils Absolute: 83 cells/uL (ref 15–500)
Eosinophils Relative: 1.2 %
HCT: 41.8 % (ref 35.0–45.0)
Hemoglobin: 14.2 g/dL (ref 11.7–15.5)
Lymphs Abs: 1967 cells/uL (ref 850–3900)
MCH: 30.8 pg (ref 27.0–33.0)
MCHC: 34 g/dL (ref 32.0–36.0)
MCV: 90.7 fL (ref 80.0–100.0)
MPV: 12.3 fL (ref 7.5–12.5)
Monocytes Relative: 6.1 %
Neutro Abs: 4402 cells/uL (ref 1500–7800)
Neutrophils Relative %: 63.8 %
Platelets: 243 10*3/uL (ref 140–400)
RBC: 4.61 10*6/uL (ref 3.80–5.10)
RDW: 12.8 % (ref 11.0–15.0)
Total Lymphocyte: 28.5 %
WBC: 6.9 10*3/uL (ref 3.8–10.8)

## 2021-08-11 LAB — DRUG MONITOR, PANEL 5, SCREEN, URINE
Amphetamines: NEGATIVE ng/mL (ref <500)
Barbiturates: NEGATIVE ng/mL (ref <300)
Benzodiazepines: NEGATIVE ng/mL (ref <100)
Cocaine Metabolite: NEGATIVE ng/mL (ref <150)
Creatinine: 18.9 mg/dL — ABNORMAL LOW (ref >=20.0)
Marijuana Metabolite: NEGATIVE ng/mL (ref <20)
Methadone Metabolite: NEGATIVE ng/mL (ref <100)
Opiates: POSITIVE ng/mL — AB (ref <100)
Oxidant: NEGATIVE ug/mL (ref <200)
Oxycodone: NEGATIVE ng/mL (ref <100)
Specific Gravity: 1.003 (ref >=1.003)
pH: 6.9 (ref 4.5–9.0)

## 2021-08-11 LAB — TSH+FREE T4: TSH W/REFLEX TO FT4: 1.76 mIU/L (ref 0.40–4.50)

## 2021-08-11 LAB — DM TEMPLATE

## 2021-08-11 NOTE — Assessment & Plan Note (Signed)
Doing well with PTU.  Update thyroid function test.

## 2021-08-11 NOTE — Assessment & Plan Note (Signed)
She has lumbar and cervical degenerative disc disease as well as osteoarthritis of the hip.  This is well managed with hydrocodone.  She is aware of precautions with this.  Discussed avoiding taking alprazolam while using this.  Updated opioid contract signed.  Updated UDS ordered.  PDMP reviewed.

## 2021-08-11 NOTE — Assessment & Plan Note (Signed)
COPD is stable.  Unfortunately she does continue to smoke.  Counseled on cessation however she is not interested at this time.

## 2021-08-11 NOTE — Progress Notes (Signed)
AALAYA YADAO - 87 y.o. female MRN [id number]  Date of birth: 1934-04-24  Subjective Chief Complaint  Patient presents with   Follow-up    HPI Kamilya is an 87 year old female here today for follow-up visit.  She is transferring care to me from Dr. Sheppard Coil.  She states that overall she is doing well.  She does have history of hyperthyroidism, COPD and chronic pain related to osteoarthritis of the hips and degenerative disc disease.  Chronic pain is managed with hydrocodone as needed.  She reports that this continues to work well for her.  She has not noted any significant side effects including significant sedation with this.  She does have some occasional constipation and uses over-the-counter fiber supplement or magnesium as needed.  She does have an active prescription for alprazolam as well but only uses this rarely.  She is aware to not take this with her pain medication.  She remains on PTU for management of hyperthyroidism.  She denies any side effects related to medication including symptoms of hypothyroidism.  COPD symptoms are stable.  Unfortunately she does continue to smoke.  ROS:  A comprehensive ROS was completed and negative except as noted per HPI  Allergies  Allergen Reactions   Amitriptyline Other (See Comments)   Amoxicillin-Pot Clavulanate Nausea And Vomiting    Vomiting    Bupropion Other (See Comments)    constipation    Citalopram Hydrobromide Other (See Comments)   Codeine Nausea And Vomiting and Nausea Only   Erythromycin Nausea And Vomiting   Escitalopram Oxalate Nausea And Vomiting   Methimazole Other (See Comments)   Neomycin-Bacitracin Zn-Polymyx Other (See Comments)   Neomycin-Bacitracin-Polymyxin  [Bacitracin-Neomycin-Polymyxin] Other (See Comments)   Other Nausea And Vomiting, Nausea Only and Rash    All Mycins   Pentazocine Nausea And Vomiting   Pitavastatin Other (See Comments)    Headache.    Salsalate Other (See Comments)   Tetracyclines &  Related Rash   Azithromycin    Crestor [Rosuvastatin Calcium] Nausea Only   Elavil [Amitriptyline Hcl]    Livalo [Pitavastatin Calcium] Other (See Comments)    Headache.    Naproxen     nausea   Neosporin [Neomycin-Polymyxin-Gramicidin]    Prozac [Fluoxetine]    Rosuvastatin     Past Medical History:  Diagnosis Date   Allergy    Cancer ([place])    skin   Colitis, ischemic ([place])    Depression    Fibromyalgia    GERD (gastroesophageal reflux disease)    Hyperthyroidism 2010   sees Dr Marcello Moores, did RAI   Peripheral vascular disease ([place])    Shingles     Past Surgical History:  Procedure Laterality Date   ABDOMINAL HYSTERECTOMY  1975   for dub   [place]   right   CATARACT EXTRACTION  2009   right and left.    CERVICAL FUSION     HEMORRHOID SURGERY  1969   ORIF ANKLE FRACTURE  1979   left   TONSILLECTOMY      Social History   Socioeconomic History   Marital status: Single    Spouse name: Not on file   Number of children: 3   Years of education: college   Highest education level: 12th grade  Occupational History   Occupation: retired    Comment: LPN  Tobacco Use   Smoking status: Every Day    Packs/day: 1.00  Years: 50.00    Pack years: 50.00    Types: Cigarettes   Smokeless tobacco: Never   Tobacco comments:    pt states that she has been trying to quit but can't seem to quit  Vaping Use   Vaping Use: Never used  Substance and Sexual Activity   Alcohol use: No   Drug use: No   Sexual activity: Not Currently  Other Topics Concern   Not on file  Social History Narrative   Lives with her daughter, [name]. Pateint is hard of hearing   Social Determinants of Health   Financial Resource Strain: Low Risk    Difficulty of Paying Living Expenses: Not hard at all  Food Insecurity: No Food Insecurity   Worried About Charity fundraiser in the Last Year: Never true   [place] in the  Last Year: Never true  Transportation Needs: No Transportation Needs   Lack of Transportation (Medical): No   Lack of Transportation (Non-Medical): No  Physical Activity: Inactive   Days of Exercise per Week: 0 days   Minutes of Exercise per Session: 0 min  Stress: No Stress Concern Present   Feeling of Stress : Only a little  Social Connections: Socially Isolated   Frequency of Communication with Friends and Family: More than three times a week   Frequency of Social Gatherings with Friends and Family: More than three times a week   Attends Religious Services: Never   Marine scientist or Organizations: No   Attends Music therapist: Never   Marital Status: Divorced    Family History  Problem Relation Age of Onset   Hypertension Sister    Cancer Daughter    Hyperlipidemia Daughter     Health Maintenance  Topic Date Due   Zoster Vaccines- Shingrix (1 of 2) Never done   COVID-19 Vaccine (4 - Booster for [employer] series) 03/05/2021   INFLUENZA VACCINE  05/09/2021   TETANUS/TDAP  03/27/2022   Pneumonia Vaccine 54+ Years old  Completed   DEXA SCAN  Completed   HPV VACCINES  Aged Out     ----------------------------------------------------------------------------------------------------------------------------------------------------------------------------------------------------------------- Physical Exam BP (!) 142/80 (BP Location: Left Arm, Patient Position: Sitting, Cuff Size: Normal)   Pulse 97   Temp 97.9 F (36.6 C)   Ht 5\' 3"  (1.6 m)   Wt 121 lb (54.9 kg)   SpO2 98%   BMI 21.43 kg/m   Physical Exam Constitutional:      Appearance: Normal appearance.  Eyes:     General: No scleral icterus. Cardiovascular:     Rate and Rhythm: Normal rate and regular rhythm.  Pulmonary:     Effort: Pulmonary effort is normal.     Breath sounds: Normal breath sounds.  Musculoskeletal:     Cervical back: Neck supple.  Neurological:     General: No focal  deficit present.     Mental Status: She is alert.  Psychiatric:        Mood and Affect: Mood normal.        Behavior: Behavior normal.    ------------------------------------------------------------------------------------------------------------------------------------------------------------------------------------------------------------------- Assessment and Plan  COPD (chronic obstructive pulmonary disease) ([place]) COPD is stable.  Unfortunately she does continue to smoke.  Counseled on cessation however she is not interested at this time.  Lumbar spinal stenosis She has lumbar and cervical degenerative disc disease as well as osteoarthritis of the hip.  This is well managed with hydrocodone.  She is aware of precautions with this.  Discussed avoiding  taking alprazolam while using this.  Updated opioid contract signed.  Updated UDS ordered.  PDMP reviewed.  Hyperthyroidism Doing well with PTU.  Update thyroid function test.   Meds ordered this encounter  Medications   HYDROcodone-acetaminophen (NORCO) 10-325 MG tablet    Sig: Take 1 tablet by mouth every 8 (eight) hours as needed for moderate pain or severe pain.    Dispense:  90 tablet    Refill:  0    Return in about 4 months (around 12/08/2021) for Chronic pain.    This visit occurred during the SARS-CoV-2 public health emergency.  Safety protocols were in place, including screening questions prior to the visit, additional usage of staff PPE, and extensive cleaning of exam room while observing appropriate contact time as indicated for disinfecting solutions.

## 2021-08-13 LAB — URINE CULTURE
MICRO NUMBER:: 12586144
SPECIMEN QUALITY:: ADEQUATE

## 2021-08-19 ENCOUNTER — Other Ambulatory Visit: Payer: Self-pay | Admitting: Family Medicine

## 2021-08-19 MED ORDER — SULFAMETHOXAZOLE-TRIMETHOPRIM 800-160 MG PO TABS: 1.0000 | ORAL_TABLET | Freq: Two times a day (BID) | ORAL | 0 refills | Status: AC

## 2021-08-31 ENCOUNTER — Other Ambulatory Visit: Payer: Medicare HMO

## 2021-08-31 ENCOUNTER — Ambulatory Visit (INDEPENDENT_AMBULATORY_CARE_PROVIDER_SITE_OTHER): Payer: Medicare HMO

## 2021-08-31 ENCOUNTER — Other Ambulatory Visit: Payer: Self-pay

## 2021-08-31 DIAGNOSIS — Z780 Asymptomatic menopausal state: Secondary | ICD-10-CM

## 2021-08-31 DIAGNOSIS — E0590 Thyrotoxicosis, unspecified without thyrotoxic crisis or storm: Secondary | ICD-10-CM

## 2021-08-31 DIAGNOSIS — J449 Chronic obstructive pulmonary disease, unspecified: Secondary | ICD-10-CM

## 2021-08-31 DIAGNOSIS — M48062 Spinal stenosis, lumbar region with neurogenic claudication: Secondary | ICD-10-CM

## 2021-08-31 DIAGNOSIS — K279 Peptic ulcer, site unspecified, unspecified as acute or chronic, without hemorrhage or perforation: Secondary | ICD-10-CM

## 2021-08-31 DIAGNOSIS — M8588 Other specified disorders of bone density and structure, other site: Secondary | ICD-10-CM | POA: Diagnosis not present

## 2021-08-31 DIAGNOSIS — M5030 Other cervical disc degeneration, unspecified cervical region: Secondary | ICD-10-CM

## 2021-08-31 DIAGNOSIS — I739 Peripheral vascular disease, unspecified: Secondary | ICD-10-CM

## 2021-08-31 DIAGNOSIS — M810 Age-related osteoporosis without current pathological fracture: Secondary | ICD-10-CM | POA: Diagnosis not present

## 2021-08-31 MED ORDER — OMEPRAZOLE 20 MG PO CPDR: 20.0000 mg | DELAYED_RELEASE_CAPSULE | Freq: Two times a day (BID) | ORAL | 3 refills | Status: DC

## 2021-08-31 MED ORDER — GABAPENTIN 300 MG PO CAPS: ORAL_CAPSULE | ORAL | 1 refills | Status: DC

## 2021-08-31 MED ORDER — SPIRIVA RESPIMAT 2.5 MCG/ACT IN AERS: INHALATION_SPRAY | RESPIRATORY_TRACT | 2 refills | Status: DC

## 2021-08-31 MED ORDER — CILOSTAZOL 100 MG PO TABS: ORAL_TABLET | ORAL | 1 refills | Status: DC

## 2021-08-31 MED ORDER — PROPYLTHIOURACIL 50 MG PO TABS: ORAL_TABLET | ORAL | 1 refills | Status: DC

## 2021-08-31 MED ORDER — CYCLOBENZAPRINE HCL 10 MG PO TABS: ORAL_TABLET | ORAL | 3 refills | Status: DC

## 2021-08-31 MED ORDER — FLUTICASONE PROPIONATE 50 MCG/ACT NA SUSP: NASAL | 11 refills | Status: DC

## 2021-09-05 DIAGNOSIS — C44319 Basal cell carcinoma of skin of other parts of face: Secondary | ICD-10-CM | POA: Diagnosis not present

## 2021-09-06 NOTE — Progress Notes (Signed)
Your bone density test shows that you have osteoporosis.  It has worsened in the last couple of years.   The current recommendation for osteoporosis treatment includes:   #1 calcium-total of 1200 mg of calcium daily.  If you eat a very calcium rich diet you may be able to obtain that without a supplement.  If not, then I recommend calcium 500 mg twice a day.  There are several products over-the-counter such as Caltrate D and Viactiv chews which are great options that contain calcium and vitamin D. #2 vitamin D-recommend 800 international units daily. #3 exercise-recommend 30 minutes of weightbearing exercise 3 days a week.  Resistance training ,such as doing bands and light weights, can be particularly helpful. #4 medication-if you are not currently on a bone builder, also called a bisphosphonate, then this has been shown to be very helpful in maintaining bone strength, preventing further thinning of the bones, and reducing your risk for fractures.  I would highly recommend that you consider starting 1 of these medications.  If you are okay with that then please let us know and we will send one to your pharmacy.  If you would like to discuss further we are happy to make an appointment for you so that we can go over options for treatment.

## 2021-09-15 ENCOUNTER — Other Ambulatory Visit: Payer: Self-pay | Admitting: Family Medicine

## 2021-09-15 ENCOUNTER — Telehealth: Payer: Self-pay

## 2021-09-15 DIAGNOSIS — R52 Pain, unspecified: Secondary | ICD-10-CM

## 2021-09-15 MED ORDER — HYDROCODONE-ACETAMINOPHEN 10-325 MG PO TABS: 1.0000 | ORAL_TABLET | Freq: Three times a day (TID) | ORAL | 0 refills | Status: DC | PRN

## 2021-09-15 NOTE — Telephone Encounter (Signed)
Pt lvm requesting.   HYDROcodone-acetaminophen (NORCO) 10-325 MG tablet

## 2021-09-15 NOTE — Telephone Encounter (Signed)
Rx renewed

## 2021-09-20 ENCOUNTER — Other Ambulatory Visit: Payer: Self-pay

## 2021-09-20 DIAGNOSIS — J449 Chronic obstructive pulmonary disease, unspecified: Secondary | ICD-10-CM

## 2021-09-20 MED ORDER — ALBUTEROL SULFATE HFA 108 (90 BASE) MCG/ACT IN AERS: 2.0000 | INHALATION_SPRAY | Freq: Four times a day (QID) | RESPIRATORY_TRACT | 11 refills | Status: DC | PRN

## 2021-09-26 ENCOUNTER — Other Ambulatory Visit: Payer: Self-pay

## 2021-09-26 ENCOUNTER — Ambulatory Visit (INDEPENDENT_AMBULATORY_CARE_PROVIDER_SITE_OTHER): Payer: Medicare HMO | Admitting: Pharmacist

## 2021-09-26 DIAGNOSIS — Z720 Tobacco use: Secondary | ICD-10-CM

## 2021-09-26 DIAGNOSIS — J449 Chronic obstructive pulmonary disease, unspecified: Secondary | ICD-10-CM

## 2021-09-26 DIAGNOSIS — E7800 Pure hypercholesterolemia, unspecified: Secondary | ICD-10-CM

## 2021-09-26 NOTE — Progress Notes (Signed)
Chronic Care Management Pharmacy Note  09/26/2021 Name:  Heather Perkins MRN:  [id number] DOB:  September 06, 1934  Summary: addressed HLD, COPD, tobacco use. Patient has nicotine gum and lozenge, will attempt to cut back smoking.  Recommendations/Changes made from today's visit:  - None, counseled on appropriate technique/use of gum and lozenge - Recommend repeat lipid panel at next PCP visit (last one 2019) to assess medication therapy.  Plan: f/u with pharmacist in ~6 months  Subjective: Heather Perkins is an 87 y.o. year old female who is a primary patient of Luetta Nutting, DO.  The CCM team was consulted for assistance with disease management and care coordination needs.    Engaged with patient by telephone for initial visit in response to provider referral for pharmacy case management and/or care coordination services.   Consent to Services:  The patient was given information about Chronic Care Management services, agreed to services, and gave verbal consent prior to initiation of services.  Please see initial visit note for detailed documentation.   Patient Care Team: Luetta Nutting, DO as PCP - General (Family Medicine) [name], [facility] as Pharmacist (Pharmacist)   Objective:  Lab Results  Component Value Date   CREATININE 1.00 (H) 08/10/2021   CREATININE 0.88 04/06/2021   CREATININE 0.86 09/22/2020    Lab Results  Component Value Date   HGBA1C 5.4 01/26/2014      Component Value Date/Time   CHOL 184 02/20/2018 1517   TRIG 170 (H) 02/20/2018 1517   HDL 66 02/20/2018 1517   CHOLHDL 2.8 02/20/2018 1517   VLDL 25 11/15/2016 1400   LDLCALC 91 02/20/2018 1517    Hepatic Function Latest Ref Rng & Units 08/10/2021 04/06/2021 09/22/2020  Total Protein 6.1 - 8.1 g/dL 7.4 7.0 6.4  Albumin 3.6 - 5.1 g/dL - - -  AST 10 - 35 U/L _0 ALT 6 - 29 U/L _1 Alk Phosphatase 33 - 130 U/L - - -  Total Bilirubin 0.2 - 1.2 mg/dL 0.6 0.6 0.8    Lab Results  Component Value  Date/Time   TSH 1.58 09/22/2020 12:00 AM   TSH 1.29 02/20/2018 03:17 PM   FREET4 1.3 09/22/2020 12:00 AM   FREET4 1.3 02/20/2018 03:17 PM    CBC Latest Ref Rng & Units 08/10/2021 03/14/2019 10/24/2018  WBC 3.8 - 10.8 Thousand/uL 6.9 21.1(H) 10.4  Hemoglobin 11.7 - 15.5 g/dL 14.2 13.2 13.6  Hematocrit 35.0 - 45.0 % 41.8 38.4 40.2  Platelets 140 - 400 Thousand/uL 243 291 273    Lab Results  Component Value Date/Time   VD25OH 20 (L) 02/20/2018 03:17 PM   VD25OH 34.9 05/26/2011 08:44 AM   Social History   Tobacco Use  Smoking Status Every Day   Packs/day: 1.00   Years: 50.00   Pack years: 50.00   Types: Cigarettes  Smokeless Tobacco Never  Tobacco Comments   pt states that she has been trying to quit but can't seem to quit   BP Readings from Last 3 Encounters:  08/10/21 (!) 142/80  04/18/21 118/73  01/12/21 125/84   Pulse Readings from Last 3 Encounters:  08/10/21 97  04/18/21 96  01/12/21 89   Wt Readings from Last 3 Encounters:  08/10/21 121 lb (54.9 kg)  01/12/21 119 lb 1.4 oz (54 kg)  10/05/20 121 lb 9.6 oz (55.2 kg)    Assessment: Review of patient past medical history, allergies, medications, health status, including review of consultants reports, laboratory and  other test data, was performed as part of comprehensive evaluation and provision of chronic care management services.   SDOH:  (Social Determinants of Health) assessments and interventions performed:    CCM Care Plan  Allergies  Allergen Reactions   Amitriptyline Other (See Comments)   Amoxicillin-Pot Clavulanate Nausea And Vomiting    Vomiting    Bupropion Other (See Comments)    constipation    Citalopram Hydrobromide Other (See Comments)   Codeine Nausea And Vomiting and Nausea Only   Erythromycin Nausea And Vomiting   Escitalopram Oxalate Nausea And Vomiting   Methimazole Other (See Comments)   Neomycin-Bacitracin Zn-Polymyx Other (See Comments)   Neomycin-Bacitracin-Polymyxin   [Bacitracin-Neomycin-Polymyxin] Other (See Comments)   Other Nausea And Vomiting, Nausea Only and Rash    All Mycins   Pentazocine Nausea And Vomiting   Pitavastatin Other (See Comments)    Headache.    Salsalate Other (See Comments)   Tetracyclines & Related Rash   Azithromycin    Crestor [Rosuvastatin Calcium] Nausea Only   Elavil [Amitriptyline Hcl]    Livalo [Pitavastatin Calcium] Other (See Comments)    Headache.    Naproxen     nausea   Neosporin [Neomycin-Polymyxin-Gramicidin]    Prozac [Fluoxetine]    Rosuvastatin     Medications Reviewed Today     Reviewed by [name], [facility] (Pharmacist) on 09/26/21 at 1416  Med List Status: <None>   Medication Order Taking? Sig Documenting Provider Last Dose Status Informant  acetaminophen (TYLENOL) 650 MG CR tablet [id number] Yes Take 1 tablet (650 mg total) by mouth every 8 (eight) hours as needed for pain. Silverio Decamp, MD Taking Active   albuterol (VENTOLIN HFA) 108 (90 Base) MCG/ACT inhaler [id number] Yes Inhale 2 puffs into the lungs every 6 (six) hours as needed for wheezing or shortness of breath. Luetta Nutting, DO Taking Active   ALPRAZolam Duanne Moron) 0.5 MG tablet [id number] Yes Take 1 tablet (0.5 mg total) by mouth as needed for anxiety. Emeterio Reeve, DO Taking Active   aspirin EC 81 MG tablet [id number] Yes Take 1 tablet (81 mg total) by mouth daily. Silverio Decamp, MD Taking Active   B Complex Vitamins (B COMPLEX PO) [id number] Yes Take by mouth. [provider] Taking Active   Calcium-Magnesium-Zinc 500-250-12.5 MG TABS [id number] Yes Take by mouth daily. TAKE 1 CAPSULE BY MOUTH 2 TIMES DAILY [provider] Taking Active   carbamide peroxide (DEBROX) 6.5 % OTIC solution [id number] Yes Place 10 drops into both ears 2 (two) times daily. Dispense 1 bottle  Patient taking differently: Place 10 drops into both ears 2 (two) times daily. Dispense 1 bottle  As needed   Emeterio Reeve, DO  Taking Active   cholecalciferol (VITAMIN D3) 25 MCG (1000 UNIT) tablet [id number] Yes Take 1,000 Units by mouth daily. [provider] Taking Active   cilostazol (PLETAL) 100 MG tablet [id number] Yes TAKE 1 TABLET(100 MG) BY MOUTH TWICE DAILY Luetta Nutting, DO Taking Active   cyclobenzaprine (FLEXERIL) 10 MG tablet [id number] Yes TAKE 1/2 TO 1 TABLET(5 TO 10 MG) BY MOUTH THREE TIMES DAILY AS NEEDED FOR MUSCLE SPASMS Luetta Nutting, DO Taking Active   fish oil-omega-3 fatty acids 1000 MG capsule [id number] Yes Take 2 g by mouth daily. [provider] Taking Active   fluticasone (FLONASE) 50 MCG/ACT nasal spray [id number] Yes SHAKE LIQUID AND USE 2 SPRAYS IN EACH NOSTRIL DAILY Luetta Nutting, DO Taking Active   gabapentin (NEURONTIN) 300 MG capsule [id number] Yes TAKE 1  CAPSULE(300 MG) BY MOUTH TWICE DAILY AS NEEDED Luetta Nutting, DO Taking Active   HYDROcodone-acetaminophen [facility]) 10-325 MG tablet [id number] Yes Take 1 tablet by mouth every 8 (eight) hours as needed for moderate pain or severe pain. Luetta Nutting, DO Taking Active   magnesium oxide (MAG-OX) 400 MG tablet [id number] Yes Take 2 tablets (800 mg total) by mouth at bedtime. Silverio Decamp, MD Taking Active   omeprazole (PRILOSEC) 20 MG capsule [id number] Yes Take 1 capsule (20 mg total) by mouth 2 (two) times daily before a meal. Luetta Nutting, DO Taking Active   ondansetron (ZOFRAN) 4 MG tablet [id number] Yes Take 1-2 tablets (4-8 mg total) by mouth every 8 (eight) hours as needed for nausea or vomiting. Emeterio Reeve, DO Taking Active   polyethylene glycol [facility] / GLYCOLAX) packet [id number] Yes Take 17 g by mouth daily. [provider] Taking Active            Med Note Jerilee Hoh, Colin Broach   Wed Feb 20, 2018  2:51 PM) As per pt, taking med qod  pravastatin (PRAVACHOL) 40 MG tablet [id number] Yes TAKE 1 TABLET(40 MG) BY MOUTH DAILY Emeterio Reeve, DO Taking Active   promethazine (PHENERGAN) 25 MG  tablet [id number] Yes Take by mouth. [provider] Taking Active   propylthiouracil (PTU) 50 MG tablet [id number] Yes TAKE 1 TABLET(50 MG) BY MOUTH TWICE DAILY Luetta Nutting, DO Taking Active   Tiotropium Bromide Monohydrate ([place]) 2.5 MCG/ACT AERS [id number] Yes INHALE 2 PUFFS INTO THE LUNGS DAILY TO HELP REDUCE COUGH Luetta Nutting, DO Taking Active             Patient Active Problem List   Diagnosis Date Noted   Dehydration 03/14/2019   Coughing 03/14/2019   Myalgia 10/24/2018   Lumbar spinal stenosis 10/15/2018   Encounter for wellness examination in adult 07/09/2018   Dry mouth 04/24/2018   Dysphagia 04/24/2018   Osteoporosis 02/20/2018   Hyperlipidemia 02/23/2015   Hard of hearing 01/23/2014   COPD (chronic obstructive pulmonary disease) ([place]) 08/12/2013   Tobacco abuse 06/12/2013   Atherosclerosis of native arteries of extremity with intermittent claudication ([place]) 02/26/2013   Claudication of right lower extremity ([place]) 01/03/2013   Recurrent UTI 08/08/2012   Right hip pain 08/08/2012   GERD (gastroesophageal reflux disease) 06/16/2011   Hypercholesteremia 04/16/2011   Hyperthyroidism 03/27/2011   DDD (degenerative disc disease), cervical 03/27/2011   Bipolar disorder ([place]) 03/03/2011   Brachial neuritis 10/25/2010   Myalgia and myositis 10/25/2010   Peptic ulcer 07/14/2009   Anxiety state 12/03/2007   Dysthymic disorder 11/04/2007   Malignant neoplasm of skin 05/24/2007   Rosacea 05/24/2007   Glaucoma 05/24/2007   Herpes zoster 05/13/2007    Immunization History  Administered Date(s) Administered   Influenza, High Dose Seasonal PF 08/15/2017   Influenza,inj,Quad PF,6+ Mos 11/15/2016   PFIZER(Purple Top)SARS-COV-2 Vaccination 01/03/2020, 01/24/2020, 01/08/2021   Pneumococcal Conjugate-13 11/15/2016   Pneumococcal Polysaccharide-23 10/09/2008   Tdap 03/27/2012    Conditions to be addressed/monitored: HLD, COPD, and tobacco use  Care  Plan : Medication Management  Updates made by [name], RPH since 09/26/2021 12:00 AM     Problem: HLD, COPD, Tobacco use      Long-Range Goal: Disease Progression Prevention   Start Date: 03/18/2021  Recent Progress: On track  Priority: High  Note:   Current Barriers:  None at present  Pharmacist Clinical Goal(s):  Over the next 180 days, patient will adhere to prescribed medication regimen as  evidenced by medication fill history through collaboration with PharmD and provider.   Interventions: 1:1 collaboration with Emeterio Reeve, DO regarding development and update of comprehensive plan of care as evidenced by provider attestation and co-signature Inter-disciplinary care team collaboration (see longitudinal plan of care) Comprehensive medication review performed; medication list updated in electronic medical record  Hyperlipidemia:   Controlled; current treatment:pravastatin 41m;   Medications previously tried: rosuvastatin   Educated on options for lifestyle/diet modifications Recommended continue current therapy, recommend updated lipid panel  Chronic Obstructive Pulmonary Disease:  Controlled; current treatment:albuterol, spiriva;   Recommended continue current regimen, and   Tobacco Abuse:  1/2 packs per day; 66 years of use; does not smoke within 30 minutes of waking up, daughter has purchased gum & lozenge to try  Previous quit attempts: unsuccessful, several attempts, longest quit was 3 months, chantix (made her sick), wellbutrin ("didn't agree w/body"), gum (didn't help)  Triggers to smoke: "whenever she thinks about it"   Motivation to quit smoking: not at this time   Educated on various modalities aside from previous medications if patient becomes ready to quit Recommended continue good work in cutting back! Recommended chewing/use of a drinking straw to assist w/hand-to-mouth motion which patient states is biggest barrier  Patient Goals/Self-Care  Activities Over the next 180 days, patient will:  take medications as prescribed  Follow Up Plan: Telephone follow up appointment with care management team member scheduled for:  6 months        Medication Assistance: None required.  Patient affirms current coverage meets needs.  [facility] [facility]Phone: [phone]: [phone]  Uses pill box? Yes Pt endorses 100% compliance  Follow Up:  Patient agrees to Care Plan and Follow-up.  Plan: Telephone follow up appointment with care management team member scheduled for:  6 months  KLarinda Buttery PharmD Clinical Pharmacist [phone]

## 2021-09-26 NOTE — Patient Instructions (Signed)
Visit Information  Thank you for taking time to visit with me today. Please don't hesitate to contact me if I can be of assistance to you before our next scheduled telephone appointment.  Following are the goals we discussed today:   Patient Goals/Self-Care Activities Over the next 180 days, patient will:  take medications as prescribed  Follow Up Plan: Telephone follow up appointment with care management team member scheduled for:  6 months  Please call the care guide team at [phone] if you need to cancel or reschedule your appointment.   Patient verbalizes understanding of instructions provided today and agrees to view in MyChart.    J   

## 2021-10-05 ENCOUNTER — Telehealth: Payer: Self-pay

## 2021-10-05 NOTE — Telephone Encounter (Signed)
Received Epic notification that pt has not read MyChart message regarding recommendations for treatment plan for her bone density scan.       This message is to inform you that the patient has not yet read the following message. (Notification date: October 04, 2021)   DEXA results  From  Fonnie Mu, CMA To  [place] and Delivered  09/20/2021  1:25 PM     We previously spoke on the phone regarding bone density scan results and recommendations.  Dr. Zigmund Daniel said to continue with current treatment.   Wishing you well, Kenney Houseman, CMA Deborra Medina) [place] User Last Read On  [place] Not Read       Spoke with pt's daughter and informed her of recommendations.  Daughter expressed understanding and is agreeable.  Charyl Bigger, CMA

## 2021-10-08 DIAGNOSIS — E7800 Pure hypercholesterolemia, unspecified: Secondary | ICD-10-CM

## 2021-10-08 DIAGNOSIS — J449 Chronic obstructive pulmonary disease, unspecified: Secondary | ICD-10-CM | POA: Diagnosis not present

## 2021-10-19 ENCOUNTER — Telehealth: Payer: Self-pay

## 2021-10-19 DIAGNOSIS — H6123 Impacted cerumen, bilateral: Secondary | ICD-10-CM | POA: Diagnosis not present

## 2021-10-19 DIAGNOSIS — J309 Allergic rhinitis, unspecified: Secondary | ICD-10-CM | POA: Diagnosis not present

## 2021-10-19 NOTE — Telephone Encounter (Signed)
Medication: denosumab (PROLIA) injection 60 mg Automatic authorization extension received from [place] dates: 10/09/2021-10/08/2022  Patient aware via: MyChart Provider aware via this encounter

## 2021-10-20 ENCOUNTER — Other Ambulatory Visit: Payer: Self-pay

## 2021-10-20 DIAGNOSIS — R52 Pain, unspecified: Secondary | ICD-10-CM

## 2021-10-20 MED ORDER — HYDROCODONE-ACETAMINOPHEN 10-325 MG PO TABS: 1.0000 | ORAL_TABLET | Freq: Three times a day (TID) | ORAL | 0 refills | Status: DC | PRN

## 2021-11-21 ENCOUNTER — Other Ambulatory Visit: Payer: Self-pay

## 2021-11-22 MED ORDER — ALPRAZOLAM 0.5 MG PO TABS: 0.5000 mg | ORAL_TABLET | ORAL | 1 refills | Status: DC | PRN

## 2021-11-28 ENCOUNTER — Other Ambulatory Visit: Payer: Self-pay

## 2021-11-28 DIAGNOSIS — R52 Pain, unspecified: Secondary | ICD-10-CM

## 2021-11-28 MED ORDER — HYDROCODONE-ACETAMINOPHEN 10-325 MG PO TABS: 1.0000 | ORAL_TABLET | Freq: Three times a day (TID) | ORAL | 0 refills | Status: DC | PRN

## 2021-12-01 DIAGNOSIS — D2272 Melanocytic nevi of left lower limb, including hip: Secondary | ICD-10-CM | POA: Diagnosis not present

## 2021-12-01 DIAGNOSIS — L570 Actinic keratosis: Secondary | ICD-10-CM | POA: Diagnosis not present

## 2021-12-01 DIAGNOSIS — L820 Inflamed seborrheic keratosis: Secondary | ICD-10-CM | POA: Diagnosis not present

## 2021-12-01 DIAGNOSIS — C44319 Basal cell carcinoma of skin of other parts of face: Secondary | ICD-10-CM | POA: Diagnosis not present

## 2021-12-08 ENCOUNTER — Ambulatory Visit: Payer: Medicare HMO | Admitting: Family Medicine

## 2021-12-22 ENCOUNTER — Ambulatory Visit: Payer: Medicare HMO | Admitting: Family Medicine

## 2022-01-05 ENCOUNTER — Ambulatory Visit (INDEPENDENT_AMBULATORY_CARE_PROVIDER_SITE_OTHER): Payer: Medicare HMO | Admitting: Family Medicine

## 2022-01-05 ENCOUNTER — Encounter: Payer: Self-pay | Admitting: Family Medicine

## 2022-01-05 VITALS — BP 151/83 | HR 68 | Ht 63.0 in | Wt 119.0 lb

## 2022-01-05 DIAGNOSIS — R52 Pain, unspecified: Secondary | ICD-10-CM

## 2022-01-05 DIAGNOSIS — E0590 Thyrotoxicosis, unspecified without thyrotoxic crisis or storm: Secondary | ICD-10-CM

## 2022-01-05 DIAGNOSIS — F1190 Opioid use, unspecified, uncomplicated: Secondary | ICD-10-CM

## 2022-01-05 DIAGNOSIS — J449 Chronic obstructive pulmonary disease, unspecified: Secondary | ICD-10-CM | POA: Diagnosis not present

## 2022-01-05 DIAGNOSIS — Z0000 Encounter for general adult medical examination without abnormal findings: Secondary | ICD-10-CM | POA: Diagnosis not present

## 2022-01-05 DIAGNOSIS — M48062 Spinal stenosis, lumbar region with neurogenic claudication: Secondary | ICD-10-CM | POA: Diagnosis not present

## 2022-01-05 MED ORDER — GABAPENTIN 300 MG PO CAPS: ORAL_CAPSULE | ORAL | 1 refills | Status: DC

## 2022-01-05 MED ORDER — HYDROCODONE-ACETAMINOPHEN 10-325 MG PO TABS: 0.5000 | ORAL_TABLET | Freq: Three times a day (TID) | ORAL | 0 refills | Status: AC | PRN

## 2022-01-07 NOTE — Assessment & Plan Note (Addendum)
Continued counseling on smoking cessation.  Denies any worsening respiratory symptoms. ?

## 2022-01-07 NOTE — Progress Notes (Signed)
?Heather Perkins - 88 y.o. female MRN [id number]  Date of birth: Jan 05, 1934 ? ?Subjective ?Chief Complaint  ?Patient presents with  ? Allergic Rhinitis   ? ? ?HPI ?Heather Perkins is an 88 year old female here today for follow-up visit.  She is accompanied today by her daughter. ? ?She is following up today for chronic pain management. ?Indication for chronic opioid: Lumbar spinal stenosis ?Medication and dose: Hydrocodone/acetaminophen 10/325 mg ?# pills per month: #90 ?Last UDS date: 08/10/2021 ?Opioid Treatment Agreement signed (Y/N): Y, 08/17/2021 ?Opioid Treatment Agreement last reviewed with patient:   ?NCCSRS reviewed this encounter (include red flags): Yes ?Chronic pain is well managed with current medication including opioids and gabapentin.  No issues with tolerance at this time. ? ?Additional chronic conditions remain well controlled without additional complaints today. ? ?ROS:  A comprehensive ROS was completed and negative except as noted per HPI ? ? ?Allergies  ?Allergen Reactions  ? Amitriptyline Other (See Comments)  ? Amoxicillin-Pot Clavulanate Nausea And Vomiting  ?  Vomiting ?  ? Bupropion Other (See Comments)  ?  constipation ?  ? Citalopram Hydrobromide Other (See Comments)  ? Codeine Nausea And Vomiting and Nausea Only  ? Erythromycin Nausea And Vomiting  ? Escitalopram Oxalate Nausea And Vomiting  ? Methimazole Other (See Comments)  ? Neomycin-Bacitracin Zn-Polymyx Other (See Comments)  ? Neomycin-Bacitracin-Polymyxin  [Bacitracin-Neomycin-Polymyxin] Other (See Comments)  ? Other Nausea And Vomiting, Nausea Only and Rash  ?  All Mycins  ? Pentazocine Nausea And Vomiting  ? Pitavastatin Other (See Comments)  ?  Headache.   ? Salsalate Other (See Comments)  ? Tetracyclines & Related Rash  ? Azithromycin   ? Crestor [Rosuvastatin Calcium] Nausea Only  ? Elavil [Amitriptyline Hcl]   ? Livalo [Pitavastatin Calcium] Other (See Comments)  ?  Headache.   ? Naproxen   ?  nausea  ? Neosporin  [Neomycin-Polymyxin-Gramicidin]   ? Prozac [Fluoxetine]   ? Rosuvastatin   ? ? ?Past Medical History:  ?Diagnosis Date  ? Allergy   ? Cancer [facility])   ? skin  ? Colitis, ischemic ([place])   ? Depression   ? Fibromyalgia   ? GERD (gastroesophageal reflux disease)   ? Hyperthyroidism 2010  ? sees Dr Marcello Moores, did RAI  ? Peripheral vascular disease ([place])   ? Shingles   ? ? ?Past Surgical History:  ?Procedure Laterality Date  ? ABDOMINAL HYSTERECTOMY  1975  ? for dub  ? APPENDECTOMY  1939  ? BREAST BIOPSY  1954  ? CARPAL TUNNEL RELEASE  1990  ? right  ? CATARACT EXTRACTION  2009  ? right and left.   ? CERVICAL FUSION    ? [place]  ? ORIF ANKLE FRACTURE  1979  ? left  ? TONSILLECTOMY    ? ? ?Social History  ? ?Socioeconomic History  ? Marital status: Single  ?  Spouse name: Not on file  ? Number of children: 3  ? Years of education: college  ? Highest education level: 12th grade  ?Occupational History  ? Occupation: retired  ?  Comment: LPN  ?Tobacco Use  ? Smoking status: Every Day  ?  Packs/day: 1.00  ?  Years: 50.00  ?  Pack years: 50.00  ?  Types: Cigarettes  ? Smokeless tobacco: Never  ? Tobacco comments:  ?  pt states that she has been trying to quit but can't seem to quit  ?Vaping Use  ? Vaping Use: Never used  ?Substance and Sexual Activity  ?  Alcohol use: No  ? Drug use: No  ? Sexual activity: Not Currently  ?Other Topics Concern  ? Not on file  ?Social History Narrative  ? Lives with her daughter, Heather Perkins. Pateint is hard of hearing  ? ?Social Determinants of Health  ? ?Financial Resource Strain: Low Risk   ? Difficulty of Paying Living Expenses: Not hard at all  ?Food Insecurity: No Food Insecurity  ? Worried About Charity fundraiser in the Last Year: Never true  ? Ran Out of Food in the Last Year: Never true  ?Transportation Needs: No Transportation Needs  ? Lack of Transportation (Medical): No  ? Lack of Transportation (Non-Medical): No  ?Physical Activity: Inactive  ? Days of Exercise per Week:  0 days  ? Minutes of Exercise per Session: 0 min  ?Stress: No Stress Concern Present  ? Feeling of Stress : Only a little  ?Social Connections: Socially Isolated  ? Frequency of Communication with Friends and Family: More than three times a week  ? Frequency of Social Gatherings with Friends and Family: More than three times a week  ? Attends Religious Services: Never  ? Active Member of Clubs or Organizations: No  ? Attends Archivist Meetings: Never  ? Marital Status: Divorced  ? ? ?Family History  ?Problem Relation Age of Onset  ? Hypertension Sister   ? Cancer Daughter   ? Hyperlipidemia Daughter   ? ? ?Health Maintenance  ?Topic Date Due  ? Zoster Vaccines- Shingrix (1 of 2) Never done  ? COVID-19 Vaccine (4 - Booster for Pfizer series) 03/05/2021  ? TETANUS/TDAP  03/27/2022  ? INFLUENZA VACCINE  05/09/2022  ? Pneumonia Vaccine 100+ Years old  Completed  ? DEXA SCAN  Completed  ? HPV VACCINES  Aged Out  ? ? ? ?----------------------------------------------------------------------------------------------------------------------------------------------------------------------------------------------------------------- ?Physical Exam ?BP (!) 151/83 (BP Location: Right Arm, Patient Position: Sitting, Cuff Size: Small)   Pulse 68   Ht '5\' 3"'$  (1.6 m)   Wt 119 lb (54 kg)   SpO2 97%   BMI 21.08 kg/m?  ? ?Physical Exam ?Constitutional:   ?   Appearance: Normal appearance.  ?Eyes:  ?   General: No scleral icterus. ?Cardiovascular:  ?   Rate and Rhythm: Normal rate and regular rhythm.  ?Pulmonary:  ?   Effort: Pulmonary effort is normal.  ?   Breath sounds: Normal breath sounds.  ?Musculoskeletal:  ?   Cervical back: Neck supple.  ?Neurological:  ?   Mental Status: She is alert.  ?Psychiatric:     ?   Mood and Affect: Mood normal.     ?   Behavior: Behavior normal.   ? ? ?------------------------------------------------------------------------------------------------------------------------------------------------------------------------------------------------------------------- ?Assessment and Plan ? ?COPD (chronic obstructive pulmonary disease) ([place]) ?Continued counseling on smoking cessation.  Denies any worsening respiratory symptoms. ? ?Hyperthyroidism ?Previous TSH within normal limits.  She will continue PTU as currently ordered. ? ?Lumbar spinal stenosis ?She remains on chronic opioids for management of her spinal stenosis.  No signs of overuse or abuse at this time.  Updated UDS ordered.  PDMP reviewed. ? ? ?Meds ordered this encounter  ?Medications  ? gabapentin (NEURONTIN) 300 MG capsule  ?  Sig: TAKE 1 CAPSULE(300 MG) BY MOUTH TWICE DAILY AS NEEDED  ?  Dispense:  180 capsule  ?  Refill:  1  ?  ZERO refills remain on this prescription. Your patient is requesting advance approval of refills for this medication to [place]  ? HYDROcodone-acetaminophen (NORCO) 10-325  MG tablet  ?  Sig: Take 0.5-1 tablets by mouth every 8 (eight) hours as needed for moderate pain or severe pain.  ?  Dispense:  90 tablet  ?  Refill:  0  ? ? ?Return in about 6 months (around 07/08/2022) for Chronic pain mgmt. ? ? ? ?This visit occurred during the SARS-CoV-2 public health emergency.  Safety protocols were in place, including screening questions prior to the visit, additional usage of staff PPE, and extensive cleaning of exam room while observing appropriate contact time as indicated for disinfecting solutions.  ? ?

## 2022-01-07 NOTE — Assessment & Plan Note (Signed)
She remains on chronic opioids for management of her spinal stenosis.  No signs of overuse or abuse at this time.  Updated UDS ordered.  PDMP reviewed. ?

## 2022-01-07 NOTE — Assessment & Plan Note (Signed)
Previous TSH within normal limits.  She will continue PTU as currently ordered. ?

## 2022-01-11 DIAGNOSIS — H6123 Impacted cerumen, bilateral: Secondary | ICD-10-CM | POA: Diagnosis not present

## 2022-01-11 DIAGNOSIS — J309 Allergic rhinitis, unspecified: Secondary | ICD-10-CM | POA: Diagnosis not present

## 2022-01-30 ENCOUNTER — Other Ambulatory Visit: Payer: Self-pay | Admitting: Family Medicine

## 2022-01-30 DIAGNOSIS — I739 Peripheral vascular disease, unspecified: Secondary | ICD-10-CM

## 2022-02-02 DIAGNOSIS — M48062 Spinal stenosis, lumbar region with neurogenic claudication: Secondary | ICD-10-CM | POA: Diagnosis not present

## 2022-02-02 DIAGNOSIS — R52 Pain, unspecified: Secondary | ICD-10-CM | POA: Diagnosis not present

## 2022-02-02 DIAGNOSIS — Z79899 Other long term (current) drug therapy: Secondary | ICD-10-CM | POA: Diagnosis not present

## 2022-02-02 DIAGNOSIS — F1190 Opioid use, unspecified, uncomplicated: Secondary | ICD-10-CM | POA: Diagnosis not present

## 2022-02-07 LAB — DRUG MONITOR, PANEL 5, SCREEN, URINE
Amphetamines: NEGATIVE ng/mL (ref <500)
Barbiturates: NEGATIVE ng/mL (ref <300)
Benzodiazepines: NEGATIVE ng/mL (ref <100)
Cocaine Metabolite: NEGATIVE ng/mL (ref <150)
Creatinine: 14.8 mg/dL — ABNORMAL LOW (ref >=20.0)
Marijuana Metabolite: NEGATIVE ng/mL (ref <20)
Methadone Metabolite: NEGATIVE ng/mL (ref <100)
Opiates: POSITIVE ng/mL — AB (ref <100)
Oxidant: NEGATIVE ug/mL (ref <200)
Oxycodone: NEGATIVE ng/mL (ref <100)
Specific Gravity: 1.003 (ref >=1.003)
pH: 7.8 (ref 4.5–9.0)

## 2022-02-07 LAB — DM TEMPLATE

## 2022-02-15 ENCOUNTER — Other Ambulatory Visit: Payer: Self-pay

## 2022-02-15 DIAGNOSIS — R52 Pain, unspecified: Secondary | ICD-10-CM

## 2022-02-15 MED ORDER — HYDROCODONE-ACETAMINOPHEN 10-325 MG PO TABS: 1.0000 | ORAL_TABLET | Freq: Three times a day (TID) | ORAL | 0 refills | Status: DC | PRN

## 2022-02-15 NOTE — Progress Notes (Signed)
Completed.

## 2022-03-28 ENCOUNTER — Other Ambulatory Visit: Payer: Self-pay

## 2022-03-28 DIAGNOSIS — R52 Pain, unspecified: Secondary | ICD-10-CM

## 2022-03-28 MED ORDER — HYDROCODONE-ACETAMINOPHEN 10-325 MG PO TABS: 1.0000 | ORAL_TABLET | Freq: Three times a day (TID) | ORAL | 0 refills | Status: AC | PRN

## 2022-03-28 NOTE — Progress Notes (Signed)
Completed.

## 2022-04-26 ENCOUNTER — Other Ambulatory Visit: Payer: Self-pay | Admitting: Osteopathic Medicine

## 2022-05-01 ENCOUNTER — Ambulatory Visit (INDEPENDENT_AMBULATORY_CARE_PROVIDER_SITE_OTHER): Payer: Medicare HMO | Admitting: Pharmacist

## 2022-05-01 DIAGNOSIS — Z720 Tobacco use: Secondary | ICD-10-CM

## 2022-05-01 DIAGNOSIS — E7800 Pure hypercholesterolemia, unspecified: Secondary | ICD-10-CM

## 2022-05-01 DIAGNOSIS — J449 Chronic obstructive pulmonary disease, unspecified: Secondary | ICD-10-CM

## 2022-05-01 NOTE — Progress Notes (Signed)
Chronic Care Management Pharmacy Note  05/01/2022 Name:  Heather Perkins MRN:  [id number] DOB:  1934-01-25  Summary: addressed HLD, COPD, tobacco use. Patient has nicotine gum and lozenge, will attempt to cut back smoking, though it is a stressful time for the family as one daughter is moving closer to the area. Patient experiencing some nausea, come and go.  Recommendations/Changes made from today's visit:  - No changes to medication at this time - Recommend repeat lipid panel at next PCP visit (last one 2019) to assess medication therapy.  Plan: f/u with pharmacist in ~6-12 months  Subjective: [name] is an 88 y.o. year old female who is a primary patient of Luetta Nutting, DO.  The CCM team was consulted for assistance with disease management and care coordination needs.    Engaged with patient by telephone for follow up visit in response to provider referral for pharmacy case management and/or care coordination services.   Consent to Services:  The patient was given information about Chronic Care Management services, agreed to services, and gave verbal consent prior to initiation of services.  Please see initial visit note for detailed documentation.   Patient Care Team: Luetta Nutting, DO as PCP - General (Family Medicine) [name], [facility] as Pharmacist (Pharmacist)   Objective:  Lab Results  Component Value Date   CREATININE 1.00 (H) 08/10/2021   CREATININE 0.88 04/06/2021   CREATININE 0.86 09/22/2020    Lab Results  Component Value Date   HGBA1C 5.4 01/26/2014      Component Value Date/Time   CHOL 184 02/20/2018 1517   TRIG 170 (H) 02/20/2018 1517   HDL 66 02/20/2018 1517   CHOLHDL 2.8 02/20/2018 1517   VLDL 25 11/15/2016 1400   LDLCALC 91 02/20/2018 1517       Latest Ref Rng & Units 08/10/2021   12:00 AM 04/06/2021   12:00 AM 09/22/2020   12:00 AM  Hepatic Function  Total Protein 6.1 - 8.1 g/dL 7.4  7.0  6.4   AST 10 - 35 U/L '16  18  17   '$ ALT 6 - 29  U/L '10  10  10   '$ Total Bilirubin 0.2 - 1.2 mg/dL 0.6  0.6  0.8     Lab Results  Component Value Date/Time   TSH 1.58 09/22/2020 12:00 AM   TSH 1.29 02/20/2018 03:17 PM   FREET4 1.3 09/22/2020 12:00 AM   FREET4 1.3 02/20/2018 03:17 PM       Latest Ref Rng & Units 08/10/2021   12:00 AM 03/14/2019    2:22 PM 10/24/2018    4:13 PM  CBC  WBC 3.8 - 10.8 Thousand/uL 6.9  21.1  10.4   Hemoglobin 11.7 - 15.5 g/dL 14.2  13.2  13.6   Hematocrit 35.0 - 45.0 % 41.8  38.4  40.2   Platelets 140 - 400 Thousand/uL 243  291  273     Lab Results  Component Value Date/Time   VD25OH 20 (L) 02/20/2018 03:17 PM   VD25OH 34.9 05/26/2011 08:44 AM   Social History   Tobacco Use  Smoking Status Every Day   Packs/day: 1.00   Years: 50.00   Total pack years: 50.00   Types: Cigarettes  Smokeless Tobacco Never  Tobacco Comments   pt states that she has been trying to quit but can't seem to quit   BP Readings from Last 3 Encounters:  01/05/22 (!) 151/83  08/10/21 (!) 142/80  04/18/21 118/73   Pulse  Readings from Last 3 Encounters:  01/05/22 68  08/10/21 97  04/18/21 96   Wt Readings from Last 3 Encounters:  01/05/22 119 lb (54 kg)  08/10/21 121 lb (54.9 kg)  01/12/21 119 lb 1.4 oz (54 kg)    Assessment: Review of patient past medical history, allergies, medications, health status, including review of consultants reports, laboratory and other test data, was performed as part of comprehensive evaluation and provision of chronic care management services.   SDOH:  (Social Determinants of Health) assessments and interventions performed:    CCM Care Plan  Allergies  Allergen Reactions   Amitriptyline Other (See Comments)   Amoxicillin-Pot Clavulanate Nausea And Vomiting    Vomiting    Bupropion Other (See Comments)    constipation    Citalopram Hydrobromide Other (See Comments)   Codeine Nausea And Vomiting and Nausea Only   Erythromycin Nausea And Vomiting   Escitalopram Oxalate  Nausea And Vomiting   Methimazole Other (See Comments)   Neomycin-Bacitracin Zn-Polymyx Other (See Comments)   Neomycin-Bacitracin-Polymyxin  [Bacitracin-Neomycin-Polymyxin] Other (See Comments)   Other Nausea And Vomiting, Nausea Only and Rash    All Mycins   Pentazocine Nausea And Vomiting   Pitavastatin Other (See Comments)    Headache.    Salsalate Other (See Comments)   Tetracyclines & Related Rash   Azithromycin    Crestor [Rosuvastatin Calcium] Nausea Only   Elavil [Amitriptyline Hcl]    Livalo [Pitavastatin Calcium] Other (See Comments)    Headache.    Naproxen     nausea   Neosporin [Neomycin-Polymyxin-Gramicidin]    Prozac [Fluoxetine]    Rosuvastatin     Medications Reviewed Today     Reviewed by [name], [employer] (Pharmacist) on 05/01/22 at 1430  Med List Status: <None>   Medication Order Taking? Sig Documenting Provider Last Dose Status Informant  albuterol (VENTOLIN HFA) 108 (90 Base) MCG/ACT inhaler [id number] Yes Inhale 2 puffs into the lungs every 6 (six) hours as needed for wheezing or shortness of breath. Luetta Nutting, DO Taking Active   ALPRAZolam Duanne Moron) 0.5 MG tablet [id number] Yes Take 1 tablet (0.5 mg total) by mouth as needed for anxiety. Luetta Nutting, DO Taking Active   aspirin EC 81 MG tablet [id number] Yes Take 1 tablet (81 mg total) by mouth daily. Silverio Decamp, MD Taking Active   Azelastine HCl (ASTEPRO) 0.15 % SOLN [id number] Yes Place into the nose. [provider] Taking Active   B Complex Vitamins (B COMPLEX PO) [id number] Yes Take by mouth. [provider] Taking Active   Calcium-Magnesium-Zinc 500-250-12.5 MG TABS [id number] Yes Take by mouth daily. TAKE 1 CAPSULE BY MOUTH 2 TIMES DAILY [provider] Taking Active   carbamide peroxide (DEBROX) 6.5 % OTIC solution [id number] No Place 10 drops into both ears 2 (two) times daily. Dispense 1 bottle  Patient not taking: Reported on 05/01/2022   Emeterio Reeve,  DO Not Taking Active   cholecalciferol (VITAMIN D3) 25 MCG (1000 UNIT) tablet [id number] Yes Take 1,000 Units by mouth daily. [provider] Taking Active   cilostazol (PLETAL) 100 MG tablet [id number] Yes TAKE 1 TABLET TWICE DAILY Luetta Nutting, DO Taking Active   cyclobenzaprine (FLEXERIL) 10 MG tablet [id number] Yes TAKE 1/2 TO 1 TABLET(5 TO 10 MG) BY MOUTH THREE TIMES DAILY AS NEEDED FOR MUSCLE SPASMS Luetta Nutting, DO Taking Active   fexofenadine (ALLEGRA) 180 MG tablet [id number] No Take 180 mg by mouth daily.  Patient not taking: Reported on 05/01/2022  [provider] Not Taking Active   fish oil-omega-3 fatty acids 1000 MG capsule [id number] Yes Take 2 g by mouth daily. [provider] Taking Active   gabapentin (NEURONTIN) 300 MG capsule [id number] Yes TAKE 1 CAPSULE(300 MG) BY MOUTH TWICE DAILY AS NEEDED Luetta Nutting, DO Taking Active   levocetirizine (XYZAL) 5 MG tablet [id number] Yes Take 5 mg by mouth daily. [provider] Taking Active   lidocaine (HM LIDOCAINE PATCH) 4 % [id number] Yes Place 1 patch onto the skin daily. [provider] Taking Active   magnesium oxide (MAG-OX) 400 MG tablet [id number] No Take 2 tablets (800 mg total) by mouth at bedtime.  Patient not taking: Reported on 05/01/2022   Silverio Decamp, MD Not Taking Active   omeprazole (PRILOSEC) 20 MG capsule [id number]  Take 1 capsule (20 mg total) by mouth 2 (two) times daily before a meal. Luetta Nutting, DO  Expired 11/29/21 2359   ondansetron (ZOFRAN) 4 MG tablet [id number] Yes TAKE 1 TO 2 TABLETS(4 TO 8 MG) BY MOUTH EVERY 8 HOURS AS NEEDED FOR NAUSEA OR VOMITING Luetta Nutting, DO Taking Active   polyethylene glycol (MIRALAX / GLYCOLAX) packet [id number] Yes Take 17 g by mouth daily. [provider] Taking Active            Med Note Jerilee Hoh, Colin Broach   Wed Feb 20, 2018  2:51 PM) As per pt, taking med qod  pravastatin (PRAVACHOL) 40 MG tablet [id number] Yes  TAKE 1 TABLET(40 MG) BY MOUTH DAILY Emeterio Reeve, DO Taking Active   promethazine (PHENERGAN) 25 MG tablet [id number] Yes Take by mouth. [provider] Taking Active   propylthiouracil (PTU) 50 MG tablet [id number] Yes TAKE 1 TABLET(50 MG) BY MOUTH TWICE DAILY Luetta Nutting, DO Taking Active             Patient Active Problem List   Diagnosis Date Noted   Dehydration 03/14/2019   Coughing 03/14/2019   Myalgia 10/24/2018   Lumbar spinal stenosis 10/15/2018   Encounter for wellness examination in adult 07/09/2018   Dry mouth 04/24/2018   Dysphagia 04/24/2018   Osteoporosis 02/20/2018   Hyperlipidemia 02/23/2015   Hard of hearing 01/23/2014   COPD (chronic obstructive pulmonary disease) ([place]) 08/12/2013   Tobacco abuse 06/12/2013   Atherosclerosis of native arteries of extremity with intermittent claudication ([place]) 02/26/2013   Claudication of right lower extremity ([place]) 01/03/2013   Recurrent UTI 08/08/2012   Right hip pain 08/08/2012   GERD (gastroesophageal reflux disease) 06/16/2011   Hypercholesteremia 04/16/2011   Hyperthyroidism 03/27/2011   DDD (degenerative disc disease), cervical 03/27/2011   Bipolar disorder ([place]) 03/03/2011   Brachial neuritis 10/25/2010   Myalgia and myositis 10/25/2010   Peptic ulcer 07/14/2009   Anxiety state 12/03/2007   Dysthymic disorder 11/04/2007   Malignant neoplasm of skin 05/24/2007   Rosacea 05/24/2007   Glaucoma 05/24/2007   Herpes zoster 05/13/2007    Immunization History  Administered Date(s) Administered   Influenza, High Dose Seasonal PF 08/15/2017   Influenza,inj,Quad PF,6+ Mos 11/15/2016   PFIZER(Purple Top)SARS-COV-2 Vaccination 01/03/2020, 01/24/2020, 01/08/2021   Pneumococcal Conjugate-13 11/15/2016   Pneumococcal Polysaccharide-23 10/09/2008   Tdap 03/27/2012    Conditions to be addressed/monitored: HLD, COPD, and tobacco use  Care Plan : Medication Management  Updates made by [name],  [place] since 05/01/2022 12:00 AM     Problem: HLD, COPD, Tobacco use      Long-Range Goal: Disease Progression Prevention   Start Date: 03/18/2021  Recent Progress: On track  Priority: High  Note:   Current Barriers:  None at present  Pharmacist Clinical Goal(s):  Over the next 180 days, patient will adhere to prescribed medication regimen as evidenced by medication fill history through collaboration with PharmD and provider.   Interventions: 1:1 collaboration with Luetta Nutting, DO regarding development and update of comprehensive plan of care as evidenced by provider attestation and co-signature Inter-disciplinary care team collaboration (see longitudinal plan of care) Comprehensive medication review performed; medication list updated in electronic medical record  Hyperlipidemia:   Controlled; current treatment:pravastatin '40mg'$ ;   Medications previously tried: rosuvastatin   Educated on options for lifestyle/diet modifications Recommended continue current therapy, recommend updated lipid panel  Chronic Obstructive Pulmonary Disease:  Controlled; current treatment:albuterol, spiriva;   Recommended continue current regimen, and   Tobacco Abuse:  1/2 packs per day; 66 years of use; does not smoke within 30 minutes of waking up, daughter has purchased gum & lozenge to try  Previous quit attempts: unsuccessful, several attempts, longest quit was 3 months, chantix (made her sick), wellbutrin ("didn't agree w/body"), gum (didn't help)  Triggers to smoke: "whenever she thinks about it"   Motivation to quit smoking: not at this time   Educated on various modalities aside from previous medications if patient becomes ready to quit Recommended continue good work in cutting back! Recommended chewing/use of a drinking straw to assist w/hand-to-mouth motion which patient states is biggest barrier  Patient Goals/Self-Care Activities Over the next 180 days, patient will:  take medications as  prescribed  Follow Up Plan: Telephone follow up appointment with care management team member scheduled for:  6 months         Medication Assistance: None required.  Patient affirms current coverage meets needs.  Patient's preferred pharmacy is:  [facility] [place], [place] - [address] [place] [address] Phone: [phone] Fax: [phone]  [place], [place] [address] Phone: [phone] Fax: [phone]   Uses pill box? Yes Pt endorses 100% compliance  Follow Up:  Patient agrees to Care Plan and Follow-up.  Plan: Telephone follow up appointment with care management team member scheduled for:  6-12 months  Larinda Buttery, PharmD Clinical Pharmacist [facility] [phone]

## 2022-05-01 NOTE — Patient Instructions (Signed)
Visit Information  Thank you for taking time to visit with me today. Please don't hesitate to contact me if I can be of assistance to you before our next scheduled telephone appointment.  Following are the goals we discussed today:  Patient Goals/Self-Care Activities Over the next 180 days, patient will:  take medications as prescribed  Follow Up Plan: Telephone follow up appointment with care management team member scheduled for: 6 months  Please call the care guide team at [phone] if you need to cancel or reschedule your appointment.   Patient verbalizes understanding of instructions and care plan provided today and agrees to view in MyChart. Active MyChart status and patient understanding of how to access instructions and care plan via MyChart confirmed with patient.      J   

## 2022-05-03 ENCOUNTER — Other Ambulatory Visit: Payer: Self-pay

## 2022-05-04 ENCOUNTER — Encounter: Payer: Self-pay | Admitting: Family Medicine

## 2022-05-04 MED ORDER — HYDROCODONE-ACETAMINOPHEN 10-325 MG PO TABS: 1.0000 | ORAL_TABLET | Freq: Three times a day (TID) | ORAL | 0 refills | Status: DC

## 2022-05-04 MED ORDER — HYDROCODONE-ACETAMINOPHEN 10-325 MG PO TABS: 1.0000 | ORAL_TABLET | Freq: Three times a day (TID) | ORAL | 0 refills | Status: AC

## 2022-05-08 DIAGNOSIS — J449 Chronic obstructive pulmonary disease, unspecified: Secondary | ICD-10-CM

## 2022-05-08 DIAGNOSIS — E7800 Pure hypercholesterolemia, unspecified: Secondary | ICD-10-CM

## 2022-05-16 ENCOUNTER — Other Ambulatory Visit: Payer: Self-pay | Admitting: Family Medicine

## 2022-05-16 DIAGNOSIS — E0590 Thyrotoxicosis, unspecified without thyrotoxic crisis or storm: Secondary | ICD-10-CM

## 2022-05-17 DIAGNOSIS — H6123 Impacted cerumen, bilateral: Secondary | ICD-10-CM | POA: Diagnosis not present

## 2022-05-17 DIAGNOSIS — J309 Allergic rhinitis, unspecified: Secondary | ICD-10-CM | POA: Diagnosis not present

## 2022-05-31 ENCOUNTER — Encounter: Payer: Self-pay | Admitting: General Practice

## 2022-06-01 DIAGNOSIS — L570 Actinic keratosis: Secondary | ICD-10-CM | POA: Diagnosis not present

## 2022-06-01 DIAGNOSIS — L299 Pruritus, unspecified: Secondary | ICD-10-CM | POA: Diagnosis not present

## 2022-06-01 DIAGNOSIS — D2239 Melanocytic nevi of other parts of face: Secondary | ICD-10-CM | POA: Diagnosis not present

## 2022-06-01 DIAGNOSIS — L814 Other melanin hyperpigmentation: Secondary | ICD-10-CM | POA: Diagnosis not present

## 2022-06-01 DIAGNOSIS — D225 Melanocytic nevi of trunk: Secondary | ICD-10-CM | POA: Diagnosis not present

## 2022-06-09 ENCOUNTER — Ambulatory Visit (INDEPENDENT_AMBULATORY_CARE_PROVIDER_SITE_OTHER): Payer: Medicare HMO | Admitting: Family Medicine

## 2022-06-09 DIAGNOSIS — Z0000 Encounter for general adult medical examination without abnormal findings: Secondary | ICD-10-CM

## 2022-06-09 NOTE — Patient Instructions (Addendum)
[place] Maintenance Summary and Written Plan of Care  Ms. Heather Perkins ,  Thank you for allowing me to perform your Medicare Annual Wellness Visit and for your ongoing commitment to your health.   Health Maintenance & Immunization History Health Maintenance  Topic Date Due   COVID-19 Vaccine (4 - Pfizer risk series) 06/25/2022 (Originally 03/05/2021)   Zoster Vaccines- Shingrix (1 of 2) 09/08/2022 (Originally 11/17/1952)   INFLUENZA VACCINE  01/07/2023 (Originally 05/09/2022)   TETANUS/TDAP  06/10/2023 (Originally 03/27/2022)   Pneumonia Vaccine 72+ Years old  Completed   DEXA SCAN  Completed   HPV VACCINES  Aged Out   Immunization History  Administered Date(s) Administered   Influenza, High Dose Seasonal PF 08/15/2017   Influenza,inj,Quad PF,6+ Mos 11/15/2016   PFIZER(Purple Top)SARS-COV-2 Vaccination 01/03/2020, 01/24/2020, 01/08/2021   Pneumococcal Conjugate-13 11/15/2016   Pneumococcal Polysaccharide-23 10/09/2008   Tdap 03/27/2012    These are the patient goals that we discussed:  Goals Addressed               This Visit's Progress     Patient Stated (pt-stated)        Patient would like to keep her maintain healthy lifestyle.         This is a list of Health Maintenance Items that are overdue or due now: Influenza vaccine Td vaccine Shingrix vaccine    Orders/Referrals Placed Today: No orders of the defined types were placed in this encounter.  (Contact our referral department at [phone] if you have not spoken with someone about your referral appointment within the next 5 days)    Follow-up Plan Follow-up with Luetta Nutting, DO as planned Please schedule shingrix and tetanus vaccine at the pharmacy. Medicare wellness visit in one year.  Patient will access AVS on my chart.      Health Maintenance, Female Adopting a healthy lifestyle and getting preventive care are important in promoting health and wellness. Ask your  health care provider about: The right schedule for you to have regular tests and exams. Things you can do on your own to prevent diseases and keep yourself healthy. What should I know about diet, weight, and exercise? Eat a healthy diet  Eat a diet that includes plenty of vegetables, fruits, low-fat dairy products, and lean protein. Do not eat a lot of foods that are high in solid fats, added sugars, or sodium. Maintain a healthy weight Body mass index (BMI) is used to identify weight problems. It estimates body fat based on height and weight. Your health care provider can help determine your BMI and help you achieve or maintain a healthy weight. Get regular exercise Get regular exercise. This is one of the most important things you can do for your health. Most adults should: Exercise for at least 150 minutes each week. The exercise should increase your heart rate and make you sweat (moderate-intensity exercise). Do strengthening exercises at least twice a week. This is in addition to the moderate-intensity exercise. Spend less time sitting. Even light physical activity can be beneficial. Watch cholesterol and blood lipids Have your blood tested for lipids and cholesterol at 86 years of age, then have this test every 5 years. Have your cholesterol levels checked more often if: Your lipid or cholesterol levels are high. You are older than 86 years of age. You are at high risk for heart disease. What should I know about cancer screening? Depending on your health history and family history, you may need to have  cancer screening at various ages. This may include screening for: Breast cancer. Cervical cancer. Colorectal cancer. Skin cancer. Lung cancer. What should I know about heart disease, diabetes, and high blood pressure? Blood pressure and heart disease High blood pressure causes heart disease and increases the risk of stroke. This is more likely to develop in people who have high  blood pressure readings or are overweight. Have your blood pressure checked: Every 3-5 years if you are 54-33 years of age. Every year if you are 33 years old or older. Diabetes Have regular diabetes screenings. This checks your fasting blood sugar level. Have the screening done: Once every three years after age 52 if you are at a normal weight and have a low risk for diabetes. More often and at a younger age if you are overweight or have a high risk for diabetes. What should I know about preventing infection? Hepatitis B If you have a higher risk for hepatitis B, you should be screened for this virus. Talk with your health care provider to find out if you are at risk for hepatitis B infection. Hepatitis C Testing is recommended for: Everyone born from [birth year] through 1963. Anyone with known risk factors for hepatitis C. Sexually transmitted infections (STIs) Get screened for STIs, including gonorrhea and chlamydia, if: You are sexually active and are younger than 86 years of age. You are older than 86 years of age and your health care provider tells you that you are at risk for this type of infection. Your sexual activity has changed since you were last screened, and you are at increased risk for chlamydia or gonorrhea. Ask your health care provider if you are at risk. Ask your health care provider about whether you are at high risk for HIV. Your health care provider may recommend a prescription medicine to help prevent HIV infection. If you choose to take medicine to prevent HIV, you should first get tested for HIV. You should then be tested every 3 months for as long as you are taking the medicine. Pregnancy If you are about to stop having your period (premenopausal) and you may become pregnant, seek counseling before you get pregnant. Take 400 to 800 micrograms (mcg) of folic acid every day if you become pregnant. Ask for birth control (contraception) if you want to prevent  pregnancy. Osteoporosis and menopause Osteoporosis is a disease in which the bones lose minerals and strength with aging. This can result in bone fractures. If you are 58 years old or older, or if you are at risk for osteoporosis and fractures, ask your health care provider if you should: Be screened for bone loss. Take a calcium or vitamin D supplement to lower your risk of fractures. Be given hormone replacement therapy (HRT) to treat symptoms of menopause. Follow these instructions at home: Alcohol use Do not drink alcohol if: Your health care provider tells you not to drink. You are pregnant, may be pregnant, or are planning to become pregnant. If you drink alcohol: Limit how much you have to: 0-1 drink a day. Know how much alcohol is in your drink. In the [place] one drink equals one 12 oz bottle of beer (355 mL), one 5 oz glass of wine (148 mL), or one 1 oz glass of hard liquor (44 mL). Lifestyle Do not use any products that contain nicotine or tobacco. These products include cigarettes, chewing tobacco, and vaping devices, such as e-cigarettes. If you need help quitting, ask your health care provider. Do not  use street drugs. Do not share needles. Ask your health care provider for help if you need support or information about quitting drugs. General instructions Schedule regular health, dental, and eye exams. Stay current with your vaccines. Tell your health care provider if: You often feel depressed. You have ever been abused or do not feel safe at home. Summary Adopting a healthy lifestyle and getting preventive care are important in promoting health and wellness. Follow your health care provider's instructions about healthy diet, exercising, and getting tested or screened for diseases. Follow your health care provider's instructions on monitoring your cholesterol and blood pressure. This information is not intended to replace advice given to you by your health care provider.  Make sure you discuss any questions you have with your health care provider. Document Revised: 02/14/2021 Document Reviewed: 02/14/2021 Elsevier Patient Education  [place]. Patient will access AVS on my chart.

## 2022-06-09 NOTE — Progress Notes (Signed)
MEDICARE ANNUAL WELLNESS VISIT  06/09/2022  Telephone Visit Disclaimer This Medicare AWV was conducted by telephone due to national recommendations for restrictions regarding the COVID-19 Pandemic (e.g. social distancing).  I verified, using two identifiers, that I am speaking with Heather Perkins or their authorized healthcare agent. I discussed the limitations, risks, security, and privacy concerns of performing an evaluation and management service by telephone and the potential availability of an in-person appointment in the future. The patient expressed understanding and agreed to proceed.  Location of Patient: Home with her daughter, Heather Perkins Location of Provider (nurse):  In the office.  Subjective:    Heather Perkins is a 88 y.o. female patient of Luetta Nutting, DO who had a Medicare Annual Wellness Visit today via telephone. Heather Perkins is Retired and lives with their daughter. she has 3 children. she reports that she is socially active and does interact with friends/family regularly. she is minimally physically active and enjoys working on puzzles.  Patient Care Team: Luetta Nutting, DO as PCP - General (Family Medicine) [name], [facility] as Pharmacist (Pharmacist)     06/09/2022   10:48 AM 02/28/2021    3:20 PM 08/05/2019    1:19 PM 01/23/2014    2:32 PM  Advanced Directives  Does Patient Have a Medical Advance Directive? Yes Yes Yes Patient has advance directive, copy not in chart  Type of Advance Directive Living will [place];Living will [place];Living will [place]  Does patient want to make changes to medical advance directive? No - Patient declined No - Patient declined No - Patient declined   Copy of [place] in Chart?  Yes - validated most recent copy scanned in chart (See row information) No - copy requested Copy requested from family    Hospital Utilization Over the Past 12 Months: # of  hospitalizations or ER visits: 0 # of surgeries: 0  Review of Systems    Patient reports that her overall health is unchanged compared to last year.  History obtained from chart review and the patient  Patient Reported Readings (BP, Pulse, CBG, Weight, etc) none  Pain Assessment Pain : 0-10 Pain Score: 8  Pain Type: Chronic pain Pain Location: Shoulder Pain Orientation: Left, Right Pain Radiating Towards: neck Pain Descriptors / Indicators: Aching Pain Onset: More than a month ago Pain Frequency: Intermittent Pain Relieving Factors: rest  Pain Relieving Factors: rest  Current Medications & Allergies (verified) Allergies as of 06/09/2022       Reactions   Amitriptyline Other (See Comments)   Amoxicillin-pot Clavulanate Nausea And Vomiting   Vomiting   Bupropion Other (See Comments)   constipation   Citalopram Hydrobromide Other (See Comments)   Codeine Nausea And Vomiting, Nausea Only   Erythromycin Nausea And Vomiting   Escitalopram Oxalate Nausea And Vomiting   Methimazole Other (See Comments)   Neomycin-bacitracin Zn-polymyx Other (See Comments)   Neomycin-bacitracin-polymyxin  [bacitracin-neomycin-polymyxin] Other (See Comments)   Other Nausea And Vomiting, Nausea Only, Rash   All Mycins   Pentazocine Nausea And Vomiting   Pitavastatin Other (See Comments)   Headache.    Salsalate Other (See Comments)   Tetracyclines & Related Rash   Azithromycin    Crestor [rosuvastatin Calcium] Nausea Only   Elavil [amitriptyline Hcl]    Livalo [pitavastatin Calcium] Other (See Comments)   Headache.    Naproxen    nausea   Neosporin [neomycin-polymyxin-gramicidin]    Prozac [fluoxetine]  Rosuvastatin         Medication List        Accurate as of June 09, 2022 11:02 AM. If you have any questions, ask your nurse or doctor.          albuterol 108 (90 Base) MCG/ACT inhaler Commonly known as: VENTOLIN HFA Inhale 2 puffs into the lungs every 6 (six) hours  as needed for wheezing or shortness of breath.   ALPRAZolam 0.5 MG tablet Commonly known as: XANAX Take 1 tablet (0.5 mg total) by mouth as needed for anxiety.   ammonium lactate 12 % lotion Commonly known as: LAC-HYDRIN Apply topically.   aspirin EC 81 MG tablet Take 1 tablet (81 mg total) by mouth daily.   Astepro 0.15 % Soln Generic drug: Azelastine HCl Place into the nose.   B COMPLEX PO Take by mouth.   Calcium-Magnesium-Zinc 500-250-12.5 MG Tabs Take by mouth daily. TAKE 1 CAPSULE BY MOUTH 2 TIMES DAILY   cholecalciferol 25 MCG (1000 UNIT) tablet Commonly known as: VITAMIN D3 Take 1,000 Units by mouth daily.   cilostazol 100 MG tablet Commonly known as: PLETAL TAKE 1 TABLET TWICE DAILY   cyclobenzaprine 10 MG tablet Commonly known as: FLEXERIL TAKE 1/2 TO 1 TABLET(5 TO 10 MG) BY MOUTH THREE TIMES DAILY AS NEEDED FOR MUSCLE SPASMS   Debrox 6.5 % OTIC solution Generic drug: carbamide peroxide Place 10 drops into both ears 2 (two) times daily. Dispense 1 bottle   fexofenadine 180 MG tablet Commonly known as: ALLEGRA Take 180 mg by mouth daily.   fish oil-omega-3 fatty acids 1000 MG capsule Take 2 g by mouth daily.   fluticasone 50 MCG/ACT nasal spray Commonly known as: FLONASE Place into both nostrils.   gabapentin 300 MG capsule Commonly known as: NEURONTIN TAKE 1 CAPSULE(300 MG) BY MOUTH TWICE DAILY AS NEEDED   HM Lidocaine Patch 4 % Generic drug: lidocaine Place 1 patch onto the skin daily.   HYDROcodone-acetaminophen 10-325 MG tablet Commonly known as: NORCO Take 1 tablet by mouth every 6 (six) hours as needed for moderate pain.   HYDROcodone-acetaminophen 10-325 MG tablet Commonly known as: NORCO Take 1 tablet by mouth every 8 (eight) hours.   HYDROcodone-acetaminophen 10-325 MG tablet Commonly known as: NORCO Take 1 tablet by mouth every 8 (eight) hours. Start taking on: July 02, 2022   levocetirizine 5 MG tablet Commonly known as:  XYZAL Take 5 mg by mouth daily.   magnesium oxide 400 MG tablet Commonly known as: MAG-OX Take 2 tablets (800 mg total) by mouth at bedtime.   omeprazole 20 MG capsule Commonly known as: PRILOSEC Take 1 capsule (20 mg total) by mouth 2 (two) times daily before a meal.   ondansetron 4 MG tablet Commonly known as: ZOFRAN TAKE 1 TO 2 TABLETS(4 TO 8 MG) BY MOUTH EVERY 8 HOURS AS NEEDED FOR NAUSEA OR VOMITING   polyethylene glycol 17 g packet Commonly known as: MIRALAX / GLYCOLAX Take 17 g by mouth daily.   pravastatin 40 MG tablet Commonly known as: PRAVACHOL TAKE 1 TABLET(40 MG) BY MOUTH DAILY   promethazine 25 MG tablet Commonly known as: PHENERGAN Take by mouth.   propylthiouracil 50 MG tablet Commonly known as: PTU TAKE 1 TABLET TWICE DAILY   tiotropium 18 MCG inhalation capsule Commonly known as: SPIRIVA Place 18 mcg into inhaler and inhale daily.        History (reviewed): Past Medical History:  Diagnosis Date   Allergy    Cancer (  [place])    skin   Colitis, ischemic ([place])    Depression    Fibromyalgia    GERD (gastroesophageal reflux disease)    Hyperthyroidism 2010   sees Dr Marcello Moores, did RAI   Peripheral vascular disease ([place])    Shingles    Past Surgical History:  Procedure Laterality Date   ABDOMINAL HYSTERECTOMY  1975   for [place]   right   CATARACT EXTRACTION  2009   right and left.    CERVICAL FUSION     HEMORRHOID SURGERY  1969   ORIF ANKLE FRACTURE  1979   left   TONSILLECTOMY     Family History  Problem Relation Age of Onset   Hypertension Sister    Cancer Daughter    Hyperlipidemia Daughter    Social History   Socioeconomic History   Marital status: Widowed    Spouse name: Not on file   Number of children: 3   Years of education: LPN school   Highest education level: 12th grade  Occupational History   Occupation: retired    Comment: LPN  Tobacco Use    Smoking status: Every Day    Packs/day: 1.00    Years: 50.00    Total pack years: 50.00    Types: Cigarettes   Smokeless tobacco: Never   Tobacco comments:    pt states that she has been trying to quit but can't seem to quit  Vaping Use   Vaping Use: Never used  Substance and Sexual Activity   Alcohol use: No   Drug use: No   Sexual activity: Not Currently  Other Topics Concern   Not on file  Social History Narrative   Lives with her daughter, Heather Perkins. Pateint is hard of hearing. She enjoys working on puzzles.   Social Determinants of Health   Financial Resource Strain: Low Risk  (02/28/2021)   Overall Financial Resource Strain (CARDIA)    Difficulty of Paying Living Expenses: Not hard at all  Food Insecurity: No Food Insecurity (06/09/2022)   Hunger Vital Sign    Worried About Running Out of Food in the Last Year: Never true    Ran Out of Food in the Last Year: Never true  Transportation Needs: No Transportation Needs (06/09/2022)   PRAPARE - Hydrologist (Medical): No    Lack of Transportation (Non-Medical): No  Physical Activity: Insufficiently Active (06/09/2022)   Exercise Vital Sign    Days of Exercise per Week: 4 days    Minutes of Exercise per Session: 20 min  Stress: No Stress Concern Present (06/09/2022)   [place]    Feeling of Stress : Only a little  Social Connections: Socially Isolated (06/09/2022)   Social Connection and Isolation Panel [NHANES]    Frequency of Communication with Friends and Family: More than three times a week    Frequency of Social Gatherings with Friends and Family: More than three times a week    Attends Religious Services: Never    Marine scientist or Organizations: No    Attends Archivist Meetings: Never    Marital Status: Widowed    Activities of Daily Living    06/09/2022   10:55 AM  In your present state of health, do you  have any difficulty performing the following activities:  Hearing? 1  Comment  wears hearing aids.  Vision? 0  Difficulty concentrating or making decisions? 0  Walking or climbing stairs? 0  Dressing or bathing? 0  Doing errands, shopping? 1  Comment her daughter goes with her.  Preparing Food and eating ? N  Using the Toilet? N  In the past six months, have you accidently leaked urine? Y  Do you have problems with loss of bowel control? Y  Managing your Medications? N  Managing your Finances? Y  Comment her daughter manages that.  Housekeeping or managing your Housekeeping? N    Patient Education/ Literacy How often do you need to have someone help you when you read instructions, pamphlets, or other written materials from your doctor or pharmacy?: 1 - Never What is the last grade level you completed in school?: LPN school  Exercise Current Exercise Habits: Home exercise routine, Type of exercise: walking, Time (Minutes): 20, Frequency (Times/Week): 5, Weekly Exercise (Minutes/Week): 100, Intensity: Moderate, Exercise limited by: None identified  Diet Patient reports consuming 2 meals a day and 2 snack(s) a day Patient reports that her primary diet is: Regular Patient reports that she does have regular access to food.   Depression Screen    06/09/2022   10:49 AM 01/05/2022    3:18 PM 08/10/2021    3:03 PM 02/28/2021    3:30 PM 10/05/2020    1:16 PM 08/05/2019    1:19 PM 07/09/2018    2:51 PM  PHQ 2/9 Scores  PHQ - 2 Score 1 2 0 0 3 0 0  PHQ- 9 Score  4 4         Fall Risk    06/09/2022   10:48 AM 01/05/2022    3:18 PM 08/10/2021    3:03 PM 02/28/2021    3:29 PM 10/05/2020    1:17 PM  Fall Risk   Falls in the past year? 1 1 0 1 0  Number falls in past yr: 0 1 0 1 0  Injury with Fall? 1 1 0 0   Risk for fall due to : History of fall(s) History of fall(s) No Fall Risks No Fall Risks;Impaired balance/gait;History of fall(s)   Follow up Falls evaluation completed;Education  provided;Falls prevention discussed Falls evaluation completed Falls evaluation completed Falls evaluation completed;Education provided;Falls prevention discussed      Objective:  Heather Perkins seemed alert and oriented and she participated appropriately during our telephone visit.  Blood Pressure Weight BMI  BP Readings from Last 3 Encounters:  01/05/22 (!) 151/83  08/10/21 (!) 142/80  04/18/21 118/73   Wt Readings from Last 3 Encounters:  01/05/22 119 lb (54 kg)  08/10/21 121 lb (54.9 kg)  01/12/21 119 lb 1.4 oz (54 kg)   BMI Readings from Last 1 Encounters:  01/05/22 21.08 kg/m    *Unable to obtain current vital signs, weight, and BMI due to telephone visit type  Hearing/Vision  Heather Perkins did not seem to have difficulty with hearing/understanding during the telephone conversation Reports that she has had a formal eye exam by an eye care professional within the past year Reports that she has had a formal hearing evaluation within the past year *Unable to fully assess hearing and vision during telephone visit type  Cognitive Function:    06/09/2022   10:58 AM 02/28/2021    3:07 PM 08/05/2019    1:23 PM 07/09/2018    2:59 PM  6CIT Screen  What Year? 0 points 0 points 0 points 0 points  What month? 0  points 0 points 0 points 0 points  What time? 0 points 0 points 0 points 0 points  Count back from 20 0 points 0 points 0 points 0 points  Months in reverse 0 points 0 points 2 points 0 points  Repeat phrase 0 points 0 points 0 points 0 points  Total Score 0 points 0 points 2 points 0 points   (Normal:0-7, Significant for Dysfunction: >8)  Normal Cognitive Function Screening: Yes   Immunization & Health Maintenance Record Immunization History  Administered Date(s) Administered   Influenza, High Dose Seasonal PF 08/15/2017   Influenza,inj,Quad PF,6+ Mos 11/15/2016   PFIZER(Purple Top)SARS-COV-2 Vaccination 01/03/2020, 01/24/2020, 01/08/2021   Pneumococcal Conjugate-13 11/15/2016    Pneumococcal Polysaccharide-23 10/09/2008   Tdap 03/27/2012    Health Maintenance  Topic Date Due   COVID-19 Vaccine (4 - Pfizer risk series) 06/25/2022 (Originally 03/05/2021)   Zoster Vaccines- Shingrix (1 of 2) 09/08/2022 (Originally 11/17/1952)   INFLUENZA VACCINE  01/07/2023 (Originally 05/09/2022)   TETANUS/TDAP  06/10/2023 (Originally 03/27/2022)   Pneumonia Vaccine 48+ Years old  Completed   DEXA SCAN  Completed   HPV VACCINES  Aged Out       Assessment  This is a routine wellness examination for [employer].  Health Maintenance: Due or Overdue There are no preventive care reminders to display for this patient.   Heather Perkins does not need a referral for [employer] Assistance: Care Management:   no Social Work:    no Prescription Assistance:  no Nutrition/Diabetes Education:  no   Plan:  Personalized Goals  Goals Addressed               This Visit's Progress     Patient Stated (pt-stated)        Patient would like to keep her maintain healthy lifestyle.       Personalized Health Maintenance & Screening Recommendations  Influenza vaccine Td vaccine Shingrix vaccine  Lung Cancer Screening Recommended: no (Low Dose CT Chest recommended if Age 63-80 years, 30 pack-year currently smoking OR have quit w/in past 15 years) Hepatitis C Screening recommended: no HIV Screening recommended: no  Advanced Directives: Written information was not prepared per patient's request.  Referrals & Orders No orders of the defined types were placed in this encounter.   Follow-up Plan Follow-up with Luetta Nutting, DO as planned Please schedule Shingrix and tetanus vaccine at the pharmacy. Medicare wellness visit in one year.  Patient will access AVS on my chart.   I have personally reviewed and noted the following in the patient's chart:   Medical and social history Use of alcohol, tobacco or illicit drugs  Current medications and supplements Functional ability and  status Nutritional status Physical activity Advanced directives List of other physicians Hospitalizations, surgeries, and ER visits in previous 12 months Vitals Screenings to include cognitive, depression, and falls Referrals and appointments  In addition, I have reviewed and discussed with Heather Perkins certain preventive protocols, quality metrics, and best practice recommendations. A written personalized care plan for preventive services as well as general preventive health recommendations is available and can be mailed to the patient at her request.      Tinnie Gens, RN BSN  06/09/2022

## 2022-06-30 ENCOUNTER — Other Ambulatory Visit: Payer: Self-pay | Admitting: Osteopathic Medicine

## 2022-07-11 ENCOUNTER — Telehealth: Payer: Self-pay

## 2022-07-11 NOTE — Telephone Encounter (Signed)
Auth# [id number] good 10/09/2022 thru 10/09/2023. Please see scanned document from [facility].

## 2022-07-12 ENCOUNTER — Ambulatory Visit (INDEPENDENT_AMBULATORY_CARE_PROVIDER_SITE_OTHER): Payer: Medicare HMO | Admitting: Family Medicine

## 2022-07-12 ENCOUNTER — Encounter: Payer: Self-pay | Admitting: Family Medicine

## 2022-07-12 VITALS — BP 128/79 | HR 92 | Ht 63.0 in | Wt 121.0 lb

## 2022-07-12 DIAGNOSIS — M48062 Spinal stenosis, lumbar region with neurogenic claudication: Secondary | ICD-10-CM

## 2022-07-12 DIAGNOSIS — Z23 Encounter for immunization: Secondary | ICD-10-CM

## 2022-07-12 DIAGNOSIS — M5030 Other cervical disc degeneration, unspecified cervical region: Secondary | ICD-10-CM | POA: Diagnosis not present

## 2022-07-12 DIAGNOSIS — E0590 Thyrotoxicosis, unspecified without thyrotoxic crisis or storm: Secondary | ICD-10-CM | POA: Diagnosis not present

## 2022-07-12 DIAGNOSIS — J449 Chronic obstructive pulmonary disease, unspecified: Secondary | ICD-10-CM

## 2022-07-12 DIAGNOSIS — F411 Generalized anxiety disorder: Secondary | ICD-10-CM | POA: Diagnosis not present

## 2022-07-12 DIAGNOSIS — F319 Bipolar disorder, unspecified: Secondary | ICD-10-CM | POA: Diagnosis not present

## 2022-07-12 DIAGNOSIS — Z79899 Other long term (current) drug therapy: Secondary | ICD-10-CM | POA: Diagnosis not present

## 2022-07-12 DIAGNOSIS — E785 Hyperlipidemia, unspecified: Secondary | ICD-10-CM | POA: Diagnosis not present

## 2022-07-12 MED ORDER — PRAVASTATIN SODIUM 40 MG PO TABS: ORAL_TABLET | ORAL | 1 refills | Status: DC

## 2022-07-12 MED ORDER — HYDROCODONE-ACETAMINOPHEN 10-325 MG PO TABS: ORAL_TABLET | ORAL | 0 refills | Status: DC

## 2022-07-13 NOTE — Assessment & Plan Note (Signed)
Continues to tolerate pravastatin well at current strength.  We will continue.

## 2022-07-13 NOTE — Assessment & Plan Note (Signed)
Stable at this time.  Continue current inhalers.  She does continue to smoke with no interest in quitting at this time.

## 2022-07-13 NOTE — Assessment & Plan Note (Signed)
Has had alprazolam but was using rarely.  We will discontinue this given her concurrent opioid use.

## 2022-07-13 NOTE — Progress Notes (Signed)
Heather Perkins - 88 y.o. female MRN [id number]  Date of birth: 21-Nov-1933  Subjective Chief Complaint  Patient presents with   Follow-up    HPI Heather Perkins is an 88 year old female here today for follow-up visit.  She is accompanied by her daughter.  She has chronic low back pain that is managed with chronic opioids.  Currently taking 10/325 mg hydrocodone with acetaminophen.  She reports taking one half tab in the morning and 1 full tablet in the afternoon and one half tab before bedtime.  This is working pretty well for her.  She does supplement with Tylenol as well. She is on gabapentin.  Has tried Lyrica in the past but did not tolerate well.  Lidocaine patches did not provide any additional relief.  Remains on PTU for history of hyperthyroidism.  Denies any symptoms at this time.  Due for updated TFTs.  Tolerating pravastatin well for management of hyperlipidemia.  GERD stable with omeprazole.  ROS:  A comprehensive ROS was completed and negative except as noted per HPI  Allergies  Allergen Reactions   Amitriptyline Other (See Comments)   Amoxicillin-Pot Clavulanate Nausea And Vomiting    Vomiting    Bupropion Other (See Comments)    constipation    Citalopram Hydrobromide Other (See Comments)   Codeine Nausea And Vomiting and Nausea Only   Erythromycin Nausea And Vomiting   Escitalopram Oxalate Nausea And Vomiting   Methimazole Other (See Comments)   Neomycin-Bacitracin Zn-Polymyx Other (See Comments)   Neomycin-Bacitracin-Polymyxin  [Bacitracin-Neomycin-Polymyxin] Other (See Comments)   Other Nausea And Vomiting, Nausea Only and Rash    All Mycins   Pentazocine Nausea And Vomiting   Pitavastatin Other (See Comments)    Headache.    Salsalate Other (See Comments)   Tetracyclines & Related Rash   Azithromycin    Crestor [Rosuvastatin Calcium] Nausea Only   Elavil [Amitriptyline Hcl]    Livalo [Pitavastatin Calcium] Other (See Comments)    Headache.    Naproxen     nausea    Neosporin [Neomycin-Polymyxin-Gramicidin]    Prozac [Fluoxetine]    Rosuvastatin     Past Medical History:  Diagnosis Date   Allergy    Cancer ([place])    skin   Colitis, ischemic ([place])    Depression    Fibromyalgia    GERD (gastroesophageal reflux disease)    Hyperthyroidism 2010   sees Dr Marcello Moores, did RAI   Peripheral vascular disease ([place])    Shingles     Past Surgical History:  Procedure Laterality Date   ABDOMINAL HYSTERECTOMY  1975   for dub   [place]   right   CATARACT EXTRACTION  2009   right and left.    CERVICAL FUSION     HEMORRHOID SURGERY  1969   ORIF ANKLE FRACTURE  1979   left   TONSILLECTOMY      Social History   Socioeconomic History   Marital status: Widowed    Spouse name: Not on file   Number of children: 3   Years of education: LPN school   Highest education level: 12th grade  Occupational History   Occupation: retired    Comment: LPN  Tobacco Use   Smoking status: Every Day    Packs/day: 1.00    Years: 50.00    Total pack years: 50.00    Types: Cigarettes   Smokeless tobacco: Never   Tobacco comments:  pt states that she has been trying to quit but can't seem to quit  Vaping Use   Vaping Use: Never used  Substance and Sexual Activity   Alcohol use: No   Drug use: No   Sexual activity: Not Currently  Other Topics Concern   Not on file  Social History Narrative   Lives with her daughter, Heather Perkins. Pateint is hard of hearing. She enjoys working on puzzles.   Social Determinants of Health   Financial Resource Strain: Low Risk  (02/28/2021)   Overall Financial Resource Strain (CARDIA)    Difficulty of Paying Living Expenses: Not hard at all  Food Insecurity: No Food Insecurity (06/09/2022)   Hunger Vital Sign    Worried About Running Out of Food in the Last Year: Never true    Ran Out of Food in the Last Year: Never true  Transportation Needs: No Transportation  Needs (06/09/2022)   PRAPARE - Hydrologist (Medical): No    Lack of Transportation (Non-Medical): No  Physical Activity: Insufficiently Active (06/09/2022)   Exercise Vital Sign    Days of Exercise per Week: 4 days    Minutes of Exercise per Session: 20 min  Stress: No Stress Concern Present (06/09/2022)   [place]    Feeling of Stress : Only a little  Social Connections: Socially Isolated (06/09/2022)   Social Connection and Isolation Panel [NHANES]    Frequency of Communication with Friends and Family: More than three times a week    Frequency of Social Gatherings with Friends and Family: More than three times a week    Attends Religious Services: Never    Marine scientist or Organizations: No    Attends Archivist Meetings: Never    Marital Status: Widowed    Family History  Problem Relation Age of Onset   Hypertension Sister    Cancer Daughter    Hyperlipidemia Daughter     Health Maintenance  Topic Date Due   Zoster Vaccines- Shingrix (1 of 2) 09/08/2022 (Originally 11/17/1952)   COVID-19 Vaccine (4 - Pfizer risk series) 11/09/2022 (Originally 03/05/2021)   TETANUS/TDAP  06/10/2023 (Originally 03/27/2022)   Pneumonia Vaccine 18+ Years old  Completed   INFLUENZA VACCINE  Completed   DEXA SCAN  Completed   HPV VACCINES  Aged Out     ----------------------------------------------------------------------------------------------------------------------------------------------------------------------------------------------------------------- Physical Exam BP 128/79 (BP Location: Left Arm, Patient Position: Sitting, Cuff Size: Normal)   Pulse 92   Ht '5\' 3"'$  (1.6 m)   Wt 121 lb (54.9 kg)   SpO2 94%   BMI 21.43 kg/m   Physical Exam Constitutional:      Appearance: Normal appearance.  Eyes:     General: No scleral icterus. Cardiovascular:     Rate and Rhythm: Normal  rate and regular rhythm.  Pulmonary:     Effort: Pulmonary effort is normal.     Breath sounds: Normal breath sounds.  Musculoskeletal:     Cervical back: Neck supple.  Neurological:     Mental Status: She is alert.  Psychiatric:        Mood and Affect: Mood normal.        Behavior: Behavior normal.     ------------------------------------------------------------------------------------------------------------------------------------------------------------------------------------------------------------------- Assessment and Plan  Hyperthyroidism Update thyroid function test today.  COPD (chronic obstructive pulmonary disease) (HCC) Stable at this time.  Continue current inhalers.  She does continue to smoke with no interest in quitting at this time.  Lumbar spinal stenosis Chronic low back pain.  Treated with chronic opioids.  Currently only using 2 tabs per day.  Reduce this from number 90/month number 60/month to reflect her actual use.  Continue gabapentin at current strength.  We did discuss adding Cymbalta however she refuses to try this.  PDMP reviewed and UDS ordered.  Hyperlipidemia Continues to tolerate pravastatin well at current strength.  We will continue.  Anxiety state Has had alprazolam but was using rarely.  We will discontinue this given her concurrent opioid use.   Meds ordered this encounter  Medications   HYDROcodone-acetaminophen (NORCO) 10-325 MG tablet    Sig: Take 1 tab every 8 hours as needed for pain    Dispense:  60 tablet    Refill:  0   pravastatin (PRAVACHOL) 40 MG tablet    Sig: TAKE 1 TABLET(40 MG) BY MOUTH DAILY    Dispense:  90 tablet    Refill:  1    Return in about 6 months (around 01/11/2023) for Chronic pain/HLD.    This visit occurred during the SARS-CoV-2 public health emergency.  Safety protocols were in place, including screening questions prior to the visit, additional usage of staff PPE, and extensive cleaning of exam room  while observing appropriate contact time as indicated for disinfecting solutions.

## 2022-07-13 NOTE — Assessment & Plan Note (Signed)
Update thyroid function test today.

## 2022-07-13 NOTE — Assessment & Plan Note (Addendum)
Chronic low back pain.  Treated with chronic opioids.  Currently only using 2 tabs per day.  Reduce this from number 90/month number 60/month to reflect her actual use.  Continue gabapentin at current strength.  We did discuss adding Cymbalta however she refuses to try this.  PDMP reviewed and UDS ordered.

## 2022-07-18 ENCOUNTER — Encounter: Payer: Self-pay | Admitting: Family Medicine

## 2022-07-19 ENCOUNTER — Other Ambulatory Visit: Payer: Self-pay | Admitting: Family Medicine

## 2022-07-19 MED ORDER — HYDROCODONE-ACETAMINOPHEN 7.5-325 MG PO TABS: 1.0000 | ORAL_TABLET | Freq: Three times a day (TID) | ORAL | 0 refills | Status: DC | PRN

## 2022-08-03 LAB — COMPLETE METABOLIC PANEL WITH GFR
AG Ratio: 2.1 (calc) (ref 1.0–2.5)
ALT: 9 U/L (ref 6–29)
AST: 16 U/L (ref 10–35)
Albumin: 4.6 g/dL (ref 3.6–5.1)
Alkaline phosphatase (APISO): 60 U/L (ref 37–153)
BUN/Creatinine Ratio: 14 (calc) (ref 6–22)
BUN: 15 mg/dL (ref 7–25)
CO2: 26 mmol/L (ref 20–32)
Calcium: 9.7 mg/dL (ref 8.6–10.4)
Chloride: 103 mmol/L (ref 98–110)
Creat: 1.05 mg/dL — ABNORMAL HIGH (ref 0.60–0.95)
Globulin: 2.2 g/dL (calc) (ref 1.9–3.7)
Glucose, Bld: 96 mg/dL (ref 65–99)
Potassium: 4.6 mmol/L (ref 3.5–5.3)
Sodium: 136 mmol/L (ref 135–146)
Total Bilirubin: 0.6 mg/dL (ref 0.2–1.2)
Total Protein: 6.8 g/dL (ref 6.1–8.1)
eGFR: 52 mL/min/{1.73_m2} — ABNORMAL LOW (ref >=60)

## 2022-08-03 LAB — CBC WITH DIFFERENTIAL/PLATELET
Absolute Monocytes: 428 cells/uL (ref 200–950)
Basophils Absolute: 41 cells/uL (ref 0–200)
Basophils Relative: 0.6 %
Eosinophils Absolute: 109 cells/uL (ref 15–500)
Eosinophils Relative: 1.6 %
HCT: 41.8 % (ref 35.0–45.0)
Hemoglobin: 13.9 g/dL (ref 11.7–15.5)
Lymphs Abs: 2224 cells/uL (ref 850–3900)
MCH: 30.3 pg (ref 27.0–33.0)
MCHC: 33.3 g/dL (ref 32.0–36.0)
MCV: 91.1 fL (ref 80.0–100.0)
MPV: 12 fL (ref 7.5–12.5)
Monocytes Relative: 6.3 %
Neutro Abs: 3998 cells/uL (ref 1500–7800)
Neutrophils Relative %: 58.8 %
Platelets: 238 10*3/uL (ref 140–400)
RBC: 4.59 10*6/uL (ref 3.80–5.10)
RDW: 13 % (ref 11.0–15.0)
Total Lymphocyte: 32.7 %
WBC: 6.8 10*3/uL (ref 3.8–10.8)

## 2022-08-03 LAB — DRUG MONITOR, PANEL 5, W/CONF, URINE
Amphetamines: NEGATIVE ng/mL (ref <500)
Barbiturates: NEGATIVE ng/mL (ref <300)
Benzodiazepines: NEGATIVE ng/mL (ref <100)
Cocaine Metabolite: NEGATIVE ng/mL (ref <150)
Codeine: NEGATIVE ng/mL (ref <50)
Creatinine: 42.7 mg/dL (ref >=20.0)
Hydrocodone: 387 ng/mL — ABNORMAL HIGH (ref <50)
Hydromorphone: 247 ng/mL — ABNORMAL HIGH (ref <50)
Marijuana Metabolite: NEGATIVE ng/mL (ref <20)
Methadone Metabolite: NEGATIVE ng/mL (ref <100)
Morphine: NEGATIVE ng/mL (ref <50)
Norhydrocodone: 339 ng/mL — ABNORMAL HIGH (ref <50)
Opiates: POSITIVE ng/mL — AB (ref <100)
Oxidant: NEGATIVE ug/mL (ref <200)
Oxycodone: NEGATIVE ng/mL (ref <100)
pH: 6.6 (ref 4.5–9.0)

## 2022-08-03 LAB — T4, FREE: Free T4: 1.1 ng/dL (ref 0.8–1.8)

## 2022-08-03 LAB — TSH: TSH: 1.13 mIU/L (ref 0.40–4.50)

## 2022-08-03 LAB — DM TEMPLATE

## 2022-08-25 DIAGNOSIS — J309 Allergic rhinitis, unspecified: Secondary | ICD-10-CM | POA: Diagnosis not present

## 2022-08-25 DIAGNOSIS — H6123 Impacted cerumen, bilateral: Secondary | ICD-10-CM | POA: Diagnosis not present

## 2022-09-05 ENCOUNTER — Other Ambulatory Visit: Payer: Self-pay | Admitting: Family Medicine

## 2022-09-05 DIAGNOSIS — K279 Peptic ulcer, site unspecified, unspecified as acute or chronic, without hemorrhage or perforation: Secondary | ICD-10-CM

## 2022-09-06 ENCOUNTER — Encounter: Payer: Self-pay | Admitting: Family Medicine

## 2022-09-06 MED ORDER — HYDROCODONE-ACETAMINOPHEN 10-325 MG PO TABS: ORAL_TABLET | ORAL | 0 refills | Status: DC

## 2022-09-06 NOTE — Telephone Encounter (Signed)
Norco 10 renewed.

## 2022-10-04 ENCOUNTER — Other Ambulatory Visit: Payer: Self-pay | Admitting: Family Medicine

## 2022-10-04 DIAGNOSIS — J449 Chronic obstructive pulmonary disease, unspecified: Secondary | ICD-10-CM

## 2022-10-21 ENCOUNTER — Other Ambulatory Visit: Payer: Self-pay | Admitting: Family Medicine

## 2022-11-06 ENCOUNTER — Other Ambulatory Visit: Payer: Self-pay | Admitting: Family Medicine

## 2022-11-06 MED ORDER — HYDROCODONE-ACETAMINOPHEN 10-325 MG PO TABS: ORAL_TABLET | ORAL | 0 refills | Status: DC

## 2022-11-28 ENCOUNTER — Other Ambulatory Visit: Payer: Self-pay | Admitting: Family Medicine

## 2022-11-28 MED ORDER — HYDROCODONE-ACETAMINOPHEN 10-325 MG PO TABS: ORAL_TABLET | ORAL | 0 refills | Status: DC

## 2022-11-28 NOTE — Telephone Encounter (Signed)
Last OV: 10/25/22 Next OV: 01/18/23 Last RF: 11/06/22

## 2022-12-01 DIAGNOSIS — H6123 Impacted cerumen, bilateral: Secondary | ICD-10-CM | POA: Diagnosis not present

## 2022-12-01 DIAGNOSIS — H9201 Otalgia, right ear: Secondary | ICD-10-CM | POA: Diagnosis not present

## 2022-12-01 DIAGNOSIS — J309 Allergic rhinitis, unspecified: Secondary | ICD-10-CM | POA: Diagnosis not present

## 2023-01-01 ENCOUNTER — Other Ambulatory Visit: Payer: Medicare HMO | Admitting: Pharmacist

## 2023-01-01 ENCOUNTER — Telehealth: Payer: Medicare HMO

## 2023-01-02 ENCOUNTER — Encounter: Payer: Self-pay | Admitting: Family Medicine

## 2023-01-02 ENCOUNTER — Ambulatory Visit (INDEPENDENT_AMBULATORY_CARE_PROVIDER_SITE_OTHER): Payer: Medicare HMO | Admitting: Family Medicine

## 2023-01-02 VITALS — BP 154/86 | HR 88 | Ht 63.0 in | Wt 123.0 lb

## 2023-01-02 DIAGNOSIS — M48062 Spinal stenosis, lumbar region with neurogenic claudication: Secondary | ICD-10-CM | POA: Diagnosis not present

## 2023-01-02 DIAGNOSIS — M79652 Pain in left thigh: Secondary | ICD-10-CM | POA: Diagnosis not present

## 2023-01-02 DIAGNOSIS — M791 Myalgia, unspecified site: Secondary | ICD-10-CM | POA: Diagnosis not present

## 2023-01-02 DIAGNOSIS — M810 Age-related osteoporosis without current pathological fracture: Secondary | ICD-10-CM

## 2023-01-02 DIAGNOSIS — M79659 Pain in unspecified thigh: Secondary | ICD-10-CM | POA: Insufficient documentation

## 2023-01-02 MED ORDER — HYDROCODONE-ACETAMINOPHEN 10-325 MG PO TABS: ORAL_TABLET | ORAL | 0 refills | Status: DC

## 2023-01-02 MED ORDER — PREGABALIN 75 MG PO CAPS: 75.0000 mg | ORAL_CAPSULE | Freq: Two times a day (BID) | ORAL | 1 refills | Status: DC

## 2023-01-02 NOTE — Assessment & Plan Note (Signed)
She has sensitivity to light touch.  She thinks this is more of a neuropathic type pain.  Changing gabapentin to Lyrica.  She may continue her chronic opioids for management of her chronic pain.  Updated prescription sent in.  She will also try lidocaine patch to area.  I think otherwise of the differential such as DVT is less likely without swelling or pain with deeper palpation.

## 2023-01-02 NOTE — Progress Notes (Signed)
Heather Perkins - 89 y.o. female MRN [id number]  Date of birth: 1934/07/18  Subjective Chief Complaint  Patient presents with   Leg Pain    HPI Heather Perkins is an 89 year old female here today with complaint of leg pain.  She has history of chronic pain that is managed with chronic opioids and gabapentin.  She is also using over-the-counter lidocaine patches on her back..  Current pain began a few days ago.  Pain is located in the anterior thigh.  She has skin sensitivity associated with this.  No rash noted.  She denies any weakness of her leg.  She does not recall any injury to the leg.  No fever or chills.  No change to bowel or bladder continence.  ROS:  A comprehensive ROS was completed and negative except as noted per HPI  Allergies  Allergen Reactions   Amitriptyline Other (See Comments)   Amoxicillin-Pot Clavulanate Nausea And Vomiting    Vomiting    Bupropion Other (See Comments)    constipation    Citalopram Hydrobromide Other (See Comments)   Codeine Nausea And Vomiting and Nausea Only   Erythromycin Nausea And Vomiting   Escitalopram Oxalate Nausea And Vomiting   Methimazole Other (See Comments)   Neomycin-Bacitracin Zn-Polymyx Other (See Comments)   Neomycin-Bacitracin-Polymyxin  [Bacitracin-Neomycin-Polymyxin] Other (See Comments)   Other Nausea And Vomiting, Nausea Only and Rash    All Mycins   Pentazocine Nausea And Vomiting   Pitavastatin Other (See Comments)    Headache.    Salsalate Other (See Comments)   Tetracyclines & Related Rash   Azithromycin    Crestor [Rosuvastatin Calcium] Nausea Only   Elavil [Amitriptyline Hcl]    Livalo [Pitavastatin Calcium] Other (See Comments)    Headache.    Naproxen     nausea   Neosporin [Neomycin-Polymyxin-Gramicidin]    Prozac [Fluoxetine]    Rosuvastatin     Past Medical History:  Diagnosis Date   Allergy    Cancer ([place])    skin   Colitis, ischemic ([place])    Depression    Fibromyalgia    GERD (gastroesophageal  reflux disease)    Hyperthyroidism 2010   sees Dr Marcello Moores, did RAI   Peripheral vascular disease ([place])    Shingles     Past Surgical History:  Procedure Laterality Date   ABDOMINAL HYSTERECTOMY  1975   for dub   [place]   right   CATARACT EXTRACTION  2009   right and left.    CERVICAL FUSION     HEMORRHOID SURGERY  1969   ORIF ANKLE FRACTURE  1979   left   TONSILLECTOMY      Social History   Socioeconomic History   Marital status: Widowed    Spouse name: Not on file   Number of children: 3   Years of education: LPN school   Highest education level: 12th grade  Occupational History   Occupation: retired    Comment: LPN  Tobacco Use   Smoking status: Every Day    Packs/day: 1.00    Years: 50.00    Additional pack years: 0.00    Total pack years: 50.00    Types: Cigarettes   Smokeless tobacco: Never   Tobacco comments:    pt states that she has been trying to quit but can't seem to quit  Vaping Use   Vaping Use: Never used  Substance and Sexual  Activity   Alcohol use: No   Drug use: No   Sexual activity: Not Currently  Other Topics Concern   Not on file  Social History Narrative   Lives with her daughter, [name]. Pateint is hard of hearing. She enjoys working on puzzles.   Social Determinants of Health   Financial Resource Strain: Low Risk  (02/28/2021)   Overall Financial Resource Strain (CARDIA)    Difficulty of Paying Living Expenses: Not hard at all  Food Insecurity: No Food Insecurity (06/09/2022)   Hunger Vital Sign    Worried About Running Out of Food in the Last Year: Never true    Ran Out of Food in the Last Year: Never true  Transportation Needs: No Transportation Needs (06/09/2022)   PRAPARE - Hydrologist (Medical): No    Lack of Transportation (Non-Medical): No  Physical Activity: Insufficiently Active (06/09/2022)   Exercise Vital Sign    Days of  Exercise per Week: 4 days    Minutes of Exercise per Session: 20 min  Stress: No Stress Concern Present (06/09/2022)   [place]    Feeling of Stress : Only a little  Social Connections: Socially Isolated (06/09/2022)   Social Connection and Isolation Panel [NHANES]    Frequency of Communication with Friends and Family: More than three times a week    Frequency of Social Gatherings with Friends and Family: More than three times a week    Attends Religious Services: Never    Marine scientist or Organizations: No    Attends Archivist Meetings: Never    Marital Status: Widowed    Family History  Problem Relation Age of Onset   Hypertension Sister    Cancer Daughter    Hyperlipidemia Daughter     Health Maintenance  Topic Date Due   DTaP/Tdap/Td (2 - Td or Tdap) 03/27/2022   COVID-19 Vaccine (4 - 2023-24 season) 07/05/2023 (Originally 06/09/2022)   Zoster Vaccines- Shingrix (1 of 2) 07/05/2023 (Originally 11/17/1954)   Medicare Annual Wellness (AWV)  06/10/2023   Pneumonia Vaccine 8+ Years old  Completed   INFLUENZA VACCINE  Completed   DEXA SCAN  Completed   HPV VACCINES  Aged Out     ----------------------------------------------------------------------------------------------------------------------------------------------------------------------------------------------------------------- Physical Exam BP (!) 154/86 (BP Location: Left Arm, Patient Position: Sitting, Cuff Size: Normal)   Pulse 88   Ht 5\' 3"  (1.6 m)   Wt 123 lb (55.8 kg)   SpO2 96%   BMI 21.79 kg/m   Physical Exam Constitutional:      Appearance: Normal appearance.  Musculoskeletal:     Comments: Left lower extremity normal to inspection.  No swelling or rash noted.  She does have sensitivity to touch along the distal anterior thigh.  Range of motion of the knee and hip are fairly good.  Mild pain with resisted inversion of the  leg.  Neurological:     General: No focal deficit present.     Mental Status: She is alert.  Psychiatric:        Mood and Affect: Mood normal.        Behavior: Behavior normal.     ------------------------------------------------------------------------------------------------------------------------------------------------------------------------------------------------------------------- Assessment and Plan  Thigh pain She has sensitivity to light touch.  She thinks this is more of a neuropathic type pain.  Changing gabapentin to Lyrica.  She may continue her chronic opioids for management of her chronic pain.  Updated prescription sent in.  She  will also try lidocaine patch to area.  I think otherwise of the differential such as DVT is less likely without swelling or pain with deeper palpation.   Meds ordered this encounter  Medications   HYDROcodone-acetaminophen (NORCO) 10-325 MG tablet    Sig: Take 1 tab every 8 hours as needed for pain    Dispense:  60 tablet    Refill:  0   pregabalin (LYRICA) 75 MG capsule    Sig: Take 1 capsule (75 mg total) by mouth 2 (two) times daily.    Dispense:  60 capsule    Refill:  1    No follow-ups on file.    This visit occurred during the SARS-CoV-2 public health emergency.  Safety protocols were in place, including screening questions prior to the visit, additional usage of staff PPE, and extensive cleaning of exam room while observing appropriate contact time as indicated for disinfecting solutions.

## 2023-01-02 NOTE — Patient Instructions (Signed)
Try lidocaine patches on thigh.  Stop gabapentin and start lyrica Let me know if symptoms are not improving

## 2023-01-03 ENCOUNTER — Other Ambulatory Visit: Payer: Medicare HMO | Admitting: Pharmacist

## 2023-01-03 LAB — BASIC METABOLIC PANEL
BUN/Creatinine Ratio: 14 (calc) (ref 6–22)
BUN: 15 mg/dL (ref 7–25)
CO2: 26 mmol/L (ref 20–32)
Calcium: 10 mg/dL (ref 8.6–10.4)
Chloride: 102 mmol/L (ref 98–110)
Creat: 1.07 mg/dL — ABNORMAL HIGH (ref 0.60–0.95)
Glucose, Bld: 121 mg/dL — ABNORMAL HIGH (ref 65–99)
Potassium: 4.7 mmol/L (ref 3.5–5.3)
Sodium: 137 mmol/L (ref 135–146)

## 2023-01-03 LAB — MAGNESIUM: Magnesium: 2 mg/dL (ref 1.5–2.5)

## 2023-01-03 NOTE — Progress Notes (Signed)
01/03/2023 Name: Heather Perkins MRN: [id number] DOB: May 09, 1934  Chief Complaint  Patient presents with   Nicotine Dependence    Heather Perkins is a 89 y.o. year old female who presented for a telephone visit.   They were referred to the pharmacist by [facility] for assistance in managing  COPD and tobacco use .    Subjective:  Care Team: Primary Care Provider: Luetta Nutting, DO   Medication Access/Adherence  Current Pharmacy:  [facility] [place], [place] - [address] [place] [address] Phone: [phone] Fax: [phone]  [place], [place] [address] [place] [address] Phone: [phone] Fax: [phone]   Patient reports affordability concerns with their medications: No  Patient reports access/transportation concerns to their pharmacy: No  Patient reports adherence concerns with their medications:  No     COPD:  Current medications: Medications tried in the past:   Reports 0 exacerbations in the past year  Current medication access support: none at present  Tobacco Abuse:  Tobacco Use History: Age when started using tobacco on a daily basis: 68 years of use Number of cigarettes per day 1/2 ppd Smokes first cigarette not within 30 minutes after waking Does not wake at night to smoke Triggers include "whenever she thinks about it"  Quit Attempt History: Longest time ever been tobacco free 3 months Methods tried in the past include chantix (made her sick), wellbutrin ("didn't agree w/body"), gum (didn't help)  Patient is not motivated to attempt quitting at this time   Objective:  Lab Results  Component Value Date   HGBA1C 5.4 01/26/2014    Lab Results  Component Value Date   CREATININE 1.07 (H) 01/02/2023   BUN 15 01/02/2023   NA 137 01/02/2023   K 4.7 01/02/2023   CL 102 01/02/2023   CO2 26 01/02/2023     Lab Results  Component Value Date   CHOL 184 02/20/2018   HDL 66 02/20/2018   LDLCALC 91 02/20/2018   TRIG 170 (H) 02/20/2018   CHOLHDL 2.8 02/20/2018    Medications Reviewed Today     Reviewed by [name], Kiel[place] (Pharmacist) on 01/03/23 at 1205  Med List Status: <None>   Medication Order Taking? Sig Documenting Provider Last Dose Status Informant  albuterol (VENTOLIN HFA) 108 (90 Base) MCG/ACT inhaler [id number] Yes INHALE 2 PUFFS INTO THE LUNGS EVERY 6 HOURS AS NEEDED FOR WHEEZING OR SHORTNESS OF Madelaine Etienne, DO Taking Active   ammonium lactate (LAC-HYDRIN) 12 % lotion [id number] Yes Apply topically. [provider] Taking Active   aspirin EC 81 MG tablet [id number] Yes Take 1 tablet (81 mg total) by mouth daily. Silverio Decamp, MD Taking Active   Azelastine HCl (ASTEPRO) 0.15 % SOLN [id number] Yes Place into the nose. [provider] Taking Active   B Complex Vitamins (B COMPLEX PO) [id number] Yes Take by mouth. [provider] Taking Active   Calcium-Magnesium-Zinc 500-250-12.5 MG TABS [id number] Yes Take by mouth daily. TAKE 1 CAPSULE BY MOUTH 2 TIMES DAILY [provider] Taking Active   carbamide peroxide (DEBROX) 6.5 % OTIC solution [id number] Yes Place 10 drops into both ears 2 (two) times daily. Dispense 1 bottle Emeterio Reeve, DO Taking Active   cholecalciferol (VITAMIN D3) 25 MCG (1000 UNIT) tablet [id number] Yes Take 1,000 Units by mouth daily. [provider]  Taking Active   cilostazol (PLETAL) 100 MG tablet [id number] Yes TAKE 1 TABLET TWICE DAILY Luetta Nutting, DO Taking Active   cyclobenzaprine (FLEXERIL) 10 MG tablet [id number] Yes TAKE 1/2 TO 1 TABLET(5 TO 10 MG) BY MOUTH THREE TIMES DAILY AS NEEDED FOR MUSCLE SPASMS Luetta Nutting, DO Taking Active   fexofenadine (ALLEGRA) 180 MG tablet [id number] Yes Take 180 mg by mouth daily. [provider] Taking Active   fish oil-omega-3 fatty acids 1000 MG  capsule [id number] Yes Take 2 g by mouth daily. [provider] Taking Active   fluticasone (FLONASE) 50 MCG/ACT nasal spray [id number] Yes Place into both nostrils. [provider] Taking Active   HYDROcodone-acetaminophen [facility]) 10-325 MG tablet [id number] Yes Take 1 tab every 8 hours as needed for pain Luetta Nutting, DO Taking Active   levocetirizine (XYZAL) 5 MG tablet [id number] Yes Take 5 mg by mouth daily. [provider] Taking Active   magnesium oxide (MAG-OX) 400 MG tablet [id number] Yes Take 2 tablets (800 mg total) by mouth at bedtime. Silverio Decamp, MD Taking Active   omeprazole (PRILOSEC) 20 MG capsule [id number] Yes TAKE 1 CAPSULE TWICE DAILY BEFORE MEALS Luetta Nutting, DO Taking Active   ondansetron (ZOFRAN) 4 MG tablet [id number] Yes TAKE 1 TO 2 TABLETS(4 TO 8 MG) BY MOUTH EVERY 8 HOURS AS NEEDED FOR NAUSEA OR VOMITING Luetta Nutting, DO Taking Active   polyethylene glycol (MIRALAX / GLYCOLAX) packet [id number] Yes Take 17 g by mouth daily. [provider] Taking Active            Med Note Jerilee Hoh, Colin Broach   Wed Feb 20, 2018  2:51 PM) As per pt, taking med qod  pravastatin (PRAVACHOL) 40 MG tablet [id number] Yes TAKE 1 TABLET(40 MG) BY MOUTH DAILY Luetta Nutting, DO Taking Active   pregabalin (LYRICA) 75 MG capsule [id number] Yes Take 1 capsule (75 mg total) by mouth 2 (two) times daily. Luetta Nutting, DO Taking Active   promethazine (PHENERGAN) 25 MG tablet [id number] Yes Take by mouth. [provider] Taking Active   propylthiouracil (PTU) 50 MG tablet [id number] Yes TAKE 1 TABLET TWICE DAILY Luetta Nutting, DO Taking Active   tiotropium (SPIRIVA) 18 MCG inhalation capsule [id number] Yes Place 18 mcg into inhaler and inhale daily. [provider] Taking Active               Assessment/Plan:   COPD: - Currently controlled.  - Recommend to continue current regimen    Tobacco Abuse - Currently uncontrolled,  though pt has reduced amount, not interested in completely quitting at this time. - Provided motivational interviewing to assess tobacco use and strategies for reduction - Provided information on 1 800 QUIT NOW support program - Encouraged daughter to reach back out if she and patient decide to quit and would like ongoing support  Follow Up Plan: PRN  Larinda Buttery, PharmD Clinical Pharmacist [facility] [phone]

## 2023-01-03 NOTE — Patient Instructions (Signed)
Marcelina Morel and Lorelee Cover chatting with you today. As discussed, keep Dr. Zigmund Daniel updated on how medicines go for the pain on the outer thigh. Since all is pretty stable with medications, I will give [place] a bit of space from our calls - but if you have any questions or need help in the future, just ask Dr. Zigmund Daniel and you and I can get back in touch!   Take care, Luana Shu, PharmD Clinical Pharmacist [facility] [phone]

## 2023-01-08 ENCOUNTER — Other Ambulatory Visit: Payer: Self-pay | Admitting: Family Medicine

## 2023-01-08 ENCOUNTER — Encounter: Payer: Self-pay | Admitting: Family Medicine

## 2023-01-08 MED ORDER — LIDOCAINE 4 % EX PTCH: 1.0000 | MEDICATED_PATCH | CUTANEOUS | 0 refills | Status: DC

## 2023-01-08 NOTE — Progress Notes (Signed)
Pt needed lidocaine prescription patch

## 2023-01-11 ENCOUNTER — Ambulatory Visit: Payer: Medicare HMO | Admitting: Family Medicine

## 2023-01-15 ENCOUNTER — Ambulatory Visit (INDEPENDENT_AMBULATORY_CARE_PROVIDER_SITE_OTHER): Payer: Medicare HMO | Admitting: Sports Medicine

## 2023-01-15 ENCOUNTER — Other Ambulatory Visit: Payer: Self-pay | Admitting: Family Medicine

## 2023-01-15 DIAGNOSIS — I739 Peripheral vascular disease, unspecified: Secondary | ICD-10-CM

## 2023-01-15 DIAGNOSIS — M48062 Spinal stenosis, lumbar region with neurogenic claudication: Secondary | ICD-10-CM

## 2023-01-15 DIAGNOSIS — M810 Age-related osteoporosis without current pathological fracture: Secondary | ICD-10-CM | POA: Diagnosis not present

## 2023-01-15 MED ORDER — DENOSUMAB 60 MG/ML ~~LOC~~ SOSY
60.0000 mg | PREFILLED_SYRINGE | Freq: Once | SUBCUTANEOUS | Status: AC
  Administered 2023-01-15: 60 mg via SUBCUTANEOUS

## 2023-01-15 NOTE — Assessment & Plan Note (Signed)
89 year old female returns, she has lumbar spinal stenosis worse at L3-L4, we treated this back in 2020, she has had several medication changes since then, she was on gabapentin, no Lyrica. Main complaint today is an odd/creepy crawly sensation left anterior thigh but not past the knee. It is mild and she was just wondering what causes it.  I do suspect it is related to her spinal stenosis, she has no skin changes. We will have her do some home physical therapy. I will see her back as needed.

## 2023-01-15 NOTE — Addendum Note (Signed)
Addended by: Chalmers Cater on: 01/15/2023 03:33 PM   Modules accepted: Orders

## 2023-01-15 NOTE — Assessment & Plan Note (Signed)
Ernest will be getting her Prolia injection today.

## 2023-01-15 NOTE — Progress Notes (Signed)
    Procedures performed today:    None.  Independent interpretation of notes and tests performed by another provider:   None.  Brief History, Exam, Impression, and Recommendations:    Lumbar spinal stenosis 89 year old female returns, she has lumbar spinal stenosis worse at L3-L4, we treated this back in 2020, she has had several medication changes since then, she was on gabapentin, no Lyrica. Main complaint today is an odd/creepy crawly sensation left anterior thigh but not past the knee. It is mild and she was just wondering what causes it.  I do suspect it is related to her spinal stenosis, she has no skin changes. We will have her do some home physical therapy. I will see her back as needed.  Osteoporosis Ryhanna will be getting her Prolia injection today.    ____________________________________________ Ihor Austin. Benjamin Stain, M.D., ABFM., CAQSM., AME. Primary Care and Sports Medicine [facility]  Adjunct Professor of Family Medicine  [employer]  Restaurant manager, fast food

## 2023-01-18 ENCOUNTER — Ambulatory Visit: Payer: Medicare HMO | Admitting: Family Medicine

## 2023-02-05 ENCOUNTER — Other Ambulatory Visit: Payer: Self-pay | Admitting: Family Medicine

## 2023-02-08 ENCOUNTER — Encounter: Payer: Self-pay | Admitting: Family Medicine

## 2023-02-08 ENCOUNTER — Ambulatory Visit (INDEPENDENT_AMBULATORY_CARE_PROVIDER_SITE_OTHER): Payer: Medicare HMO | Admitting: Family Medicine

## 2023-02-08 VITALS — BP 132/63 | HR 71 | Ht 63.0 in | Wt 126.0 lb

## 2023-02-08 DIAGNOSIS — F319 Bipolar disorder, unspecified: Secondary | ICD-10-CM | POA: Diagnosis not present

## 2023-02-08 DIAGNOSIS — I70219 Atherosclerosis of native arteries of extremities with intermittent claudication, unspecified extremity: Secondary | ICD-10-CM

## 2023-02-08 DIAGNOSIS — E0590 Thyrotoxicosis, unspecified without thyrotoxic crisis or storm: Secondary | ICD-10-CM | POA: Diagnosis not present

## 2023-02-08 DIAGNOSIS — M48062 Spinal stenosis, lumbar region with neurogenic claudication: Secondary | ICD-10-CM | POA: Diagnosis not present

## 2023-02-08 DIAGNOSIS — J449 Chronic obstructive pulmonary disease, unspecified: Secondary | ICD-10-CM

## 2023-02-08 MED ORDER — BREZTRI AEROSPHERE 160-9-4.8 MCG/ACT IN AERO: 2.0000 | INHALATION_SPRAY | Freq: Two times a day (BID) | RESPIRATORY_TRACT | 11 refills | Status: DC

## 2023-02-08 MED ORDER — HYDROCODONE-ACETAMINOPHEN 10-325 MG PO TABS: ORAL_TABLET | ORAL | 0 refills | Status: DC

## 2023-02-08 NOTE — Progress Notes (Signed)
Heather Perkins - 89 y.o. female MRN [id number]  Date of birth: 1934/07/09  Subjective Chief Complaint  Patient presents with   Hypertension    HPI Heather Perkins is a 89 y.o. female here today for follow up visit.   She reports that she is doing well. Lyrica added by Dr. Benjamin Stain recently has helped with nerve pain in her legs.  It did not do a whole lot to help with her back and hand pain.  The only thing that she has found to be effective for this is norco.  She is using OTC lidocaine patches as needed.    She is prescribed Spiriva for COPD with albuterol as needed.  She has not used her spiriva in quite sometime.  She is using albuterol more frequently.  Continues to smoke.  No interest in quitting at this time.   Remains on pletal for symptoms related to PAD.    Remains on PTU for hyperthyroidism.  Stable with this.   ROS:  A comprehensive ROS was completed and negative except as noted per HPI  Allergies  Allergen Reactions   Amitriptyline Other (See Comments)   Amoxicillin-Pot Clavulanate Nausea And Vomiting    Vomiting    Bupropion Other (See Comments)    constipation    Citalopram Hydrobromide Other (See Comments)   Codeine Nausea And Vomiting and Nausea Only   Erythromycin Nausea And Vomiting   Escitalopram Oxalate Nausea And Vomiting   Methimazole Other (See Comments)   Neomycin-Bacitracin Zn-Polymyx Other (See Comments)   Neomycin-Bacitracin-Polymyxin  [Bacitracin-Neomycin-Polymyxin] Other (See Comments)   Other Nausea And Vomiting, Nausea Only and Rash    All Mycins   Pentazocine Nausea And Vomiting   Pitavastatin Other (See Comments)    Headache.    Salsalate Other (See Comments)   Tetracyclines & Related Rash   Azithromycin    Crestor [Rosuvastatin Calcium] Nausea Only   Elavil [Amitriptyline Hcl]    Livalo [Pitavastatin Calcium] Other (See Comments)    Headache.    Naproxen     nausea   Neosporin [Neomycin-Polymyxin-Gramicidin]    Prozac [Fluoxetine]     Rosuvastatin     Past Medical History:  Diagnosis Date   Allergy    Cancer (HCC)    skin   Colitis, ischemic (HCC)    Depression    Fibromyalgia    GERD (gastroesophageal reflux disease)    Hyperthyroidism 2010   sees Dr Louie Boston, did RAI   Peripheral vascular disease (HCC)    Shingles     Past Surgical History:  Procedure Laterality Date   ABDOMINAL HYSTERECTOMY  1975   for dub   APPENDECTOMY  1939   BREAST BIOPSY  1954   CARPAL TUNNEL RELEASE  1990   right   CATARACT EXTRACTION  2009   right and left.    CERVICAL FUSION     HEMORRHOID SURGERY  1969   ORIF ANKLE FRACTURE  1979   left   TONSILLECTOMY      Social History   Socioeconomic History   Marital status: Widowed    Spouse name: Not on file   Number of children: 3   Years of education: LPN school   Highest education level: 12th grade  Occupational History   Occupation: retired    Comment: LPN  Tobacco Use   Smoking status: Every Day    Packs/day: 1.00    Years: 50.00    Additional pack years: 0.00    Total pack years: 50.00  Types: Cigarettes   Smokeless tobacco: Never   Tobacco comments:    pt states that she has been trying to quit but can't seem to quit  Vaping Use   Vaping Use: Never used  Substance and Sexual Activity   Alcohol use: No   Drug use: No   Sexual activity: Not Currently  Other Topics Concern   Not on file  Social History Narrative   Lives with her daughter, [name]. Pateint is hard of hearing. She enjoys working on puzzles.   Social Determinants of Health   Financial Resource Strain: Low Risk  (02/28/2021)   Overall Financial Resource Strain (CARDIA)    Difficulty of Paying Living Expenses: Not hard at all  Food Insecurity: No Food Insecurity (06/09/2022)   Hunger Vital Sign    Worried About Running Out of Food in the Last Year: Never true    Ran Out of Food in the Last Year: Never true  Transportation Needs: No Transportation Needs (06/09/2022)   PRAPARE -  Administrator, Civil Service (Medical): No    Lack of Transportation (Non-Medical): No  Physical Activity: Insufficiently Active (06/09/2022)   Exercise Vital Sign    Days of Exercise per Week: 4 days    Minutes of Exercise per Session: 20 min  Stress: No Stress Concern Present (06/09/2022)   [employer] of Occupational Health - Occupational Stress Questionnaire    Feeling of Stress : Only a little  Social Connections: Socially Isolated (06/09/2022)   Social Connection and Isolation Panel [NHANES]    Frequency of Communication with Friends and Family: More than three times a week    Frequency of Social Gatherings with Friends and Family: More than three times a week    Attends Religious Services: Never    Database administrator or Organizations: No    Attends Banker Meetings: Never    Marital Status: Widowed    Family History  Problem Relation Age of Onset   Hypertension Sister    Cancer Daughter    Hyperlipidemia Daughter     Health Maintenance  Topic Date Due   DTaP/Tdap/Td (2 - Td or Tdap) 03/27/2022   COVID-19 Vaccine (4 - 2023-24 season) 07/05/2023 (Originally 06/09/2022)   Zoster Vaccines- Shingrix (1 of 2) 07/05/2023 (Originally 11/17/1952)   INFLUENZA VACCINE  05/10/2023   Medicare Annual Wellness (AWV)  06/10/2023   Pneumonia Vaccine 38+ Years old  Completed   DEXA SCAN  Completed   HPV VACCINES  Aged Out     ----------------------------------------------------------------------------------------------------------------------------------------------------------------------------------------------------------------- Physical Exam BP 132/63 (BP Location: Left Arm, Patient Position: Sitting, Cuff Size: Normal)   Pulse 71   Ht 5\' 3"  (1.6 m)   Wt 126 lb (57.2 kg)   SpO2 96%   BMI 22.32 kg/m   Physical Exam Constitutional:      Appearance: Normal appearance.  HENT:     Head: Normocephalic and atraumatic.  Eyes:     General: No scleral  icterus. Cardiovascular:     Rate and Rhythm: Normal rate and regular rhythm.  Pulmonary:     Effort: Pulmonary effort is normal.     Breath sounds: Normal breath sounds.  Musculoskeletal:     Cervical back: Neck supple.  Neurological:     Mental Status: She is alert.  Psychiatric:        Mood and Affect: Mood normal.        Behavior: Behavior normal.     ------------------------------------------------------------------------------------------------------------------------------------------------------------------------------------------------------------------- Assessment and Plan  COPD (chronic  obstructive pulmonary disease) (HCC) She is not using Spiriva regularly.  She is having increased symptoms related to her COPD.  Adding Breztri to replace Spiriva.  Hyperthyroidism She remains on propylthiouracil.  Stable at this time.  Lumbar spinal stenosis Remains on chronic opioids for management of her chronic low back pain and hand pain.  Lyrica has helped some with her neuropathic pain but has not really done a whole lot for her lower back pain.  Will plan to continue opioids at current strength.  Repeat UDS at next visit.  Atherosclerosis of native arteries of extremity with intermittent claudication She will continue cilostazol.  Encouraged smoking cessation.   Meds ordered this encounter  Medications   HYDROcodone-acetaminophen (NORCO) 10-325 MG tablet    Sig: Take 1 tab every 8 hours as needed for pain    Dispense:  60 tablet    Refill:  0   Budeson-Glycopyrrol-Formoterol (BREZTRI AEROSPHERE) 160-9-4.8 MCG/ACT AERO    Sig: Inhale 2 puffs into the lungs 2 (two) times daily.    Dispense:  10.7 g    Refill:  11    Return in about 6 months (around 08/11/2023) for Chronic pain/COPD.    This visit occurred during the SARS-CoV-2 public health emergency.  Safety protocols were in place, including screening questions prior to the visit, additional usage of staff PPE, and  extensive cleaning of exam room while observing appropriate contact time as indicated for disinfecting solutions.

## 2023-02-08 NOTE — Patient Instructions (Addendum)
Start Breztri to replace spiriva.  Continue albuterol as needed.  Take your time and avoid standing up too quickly to keep from getting dizzy.

## 2023-02-13 ENCOUNTER — Encounter: Payer: Self-pay | Admitting: Family Medicine

## 2023-02-13 NOTE — Assessment & Plan Note (Signed)
She is not using Spiriva regularly.  She is having increased symptoms related to her COPD.  Adding Breztri to replace Spiriva.

## 2023-02-13 NOTE — Assessment & Plan Note (Signed)
Remains on chronic opioids for management of her chronic low back pain and hand pain.  Lyrica has helped some with her neuropathic pain but has not really done a whole lot for her lower back pain.  Will plan to continue opioids at current strength.  Repeat UDS at next visit.

## 2023-02-13 NOTE — Assessment & Plan Note (Signed)
She remains on propylthiouracil.  Stable at this time.

## 2023-02-13 NOTE — Assessment & Plan Note (Signed)
She will continue cilostazol.  Encouraged smoking cessation.

## 2023-02-14 DIAGNOSIS — I517 Cardiomegaly: Secondary | ICD-10-CM | POA: Diagnosis not present

## 2023-02-14 DIAGNOSIS — R9431 Abnormal electrocardiogram [ECG] [EKG]: Secondary | ICD-10-CM | POA: Diagnosis not present

## 2023-02-14 DIAGNOSIS — E039 Hypothyroidism, unspecified: Secondary | ICD-10-CM | POA: Diagnosis not present

## 2023-02-14 DIAGNOSIS — Z66 Do not resuscitate: Secondary | ICD-10-CM | POA: Diagnosis not present

## 2023-02-14 DIAGNOSIS — R0789 Other chest pain: Secondary | ICD-10-CM | POA: Diagnosis not present

## 2023-02-14 DIAGNOSIS — R0781 Pleurodynia: Secondary | ICD-10-CM | POA: Diagnosis not present

## 2023-02-14 DIAGNOSIS — J9601 Acute respiratory failure with hypoxia: Secondary | ICD-10-CM | POA: Diagnosis not present

## 2023-02-14 DIAGNOSIS — R918 Other nonspecific abnormal finding of lung field: Secondary | ICD-10-CM | POA: Insufficient documentation

## 2023-02-14 DIAGNOSIS — I371 Nonrheumatic pulmonary valve insufficiency: Secondary | ICD-10-CM | POA: Diagnosis not present

## 2023-02-14 DIAGNOSIS — R7989 Other specified abnormal findings of blood chemistry: Secondary | ICD-10-CM | POA: Diagnosis not present

## 2023-02-14 DIAGNOSIS — W19XXXA Unspecified fall, initial encounter: Secondary | ICD-10-CM | POA: Diagnosis not present

## 2023-02-14 DIAGNOSIS — Z1152 Encounter for screening for COVID-19: Secondary | ICD-10-CM | POA: Diagnosis not present

## 2023-02-14 DIAGNOSIS — J449 Chronic obstructive pulmonary disease, unspecified: Secondary | ICD-10-CM | POA: Diagnosis not present

## 2023-02-14 DIAGNOSIS — J168 Pneumonia due to other specified infectious organisms: Secondary | ICD-10-CM | POA: Diagnosis not present

## 2023-02-14 DIAGNOSIS — I359 Nonrheumatic aortic valve disorder, unspecified: Secondary | ICD-10-CM | POA: Diagnosis not present

## 2023-02-14 DIAGNOSIS — I10 Essential (primary) hypertension: Secondary | ICD-10-CM | POA: Diagnosis not present

## 2023-02-14 DIAGNOSIS — J159 Unspecified bacterial pneumonia: Secondary | ICD-10-CM | POA: Diagnosis not present

## 2023-02-14 DIAGNOSIS — J440 Chronic obstructive pulmonary disease with acute lower respiratory infection: Secondary | ICD-10-CM | POA: Diagnosis not present

## 2023-02-14 DIAGNOSIS — I2510 Atherosclerotic heart disease of native coronary artery without angina pectoris: Secondary | ICD-10-CM | POA: Diagnosis not present

## 2023-02-14 DIAGNOSIS — J432 Centrilobular emphysema: Secondary | ICD-10-CM | POA: Diagnosis not present

## 2023-02-14 DIAGNOSIS — S299XXA Unspecified injury of thorax, initial encounter: Secondary | ICD-10-CM | POA: Diagnosis not present

## 2023-02-15 DIAGNOSIS — J449 Chronic obstructive pulmonary disease, unspecified: Secondary | ICD-10-CM | POA: Diagnosis not present

## 2023-02-15 DIAGNOSIS — J9601 Acute respiratory failure with hypoxia: Secondary | ICD-10-CM | POA: Diagnosis not present

## 2023-02-15 DIAGNOSIS — R0789 Other chest pain: Secondary | ICD-10-CM | POA: Diagnosis not present

## 2023-02-15 DIAGNOSIS — R7989 Other specified abnormal findings of blood chemistry: Secondary | ICD-10-CM | POA: Diagnosis not present

## 2023-02-15 DIAGNOSIS — R918 Other nonspecific abnormal finding of lung field: Secondary | ICD-10-CM | POA: Diagnosis not present

## 2023-02-15 DIAGNOSIS — J159 Unspecified bacterial pneumonia: Secondary | ICD-10-CM | POA: Diagnosis not present

## 2023-02-16 DIAGNOSIS — J159 Unspecified bacterial pneumonia: Secondary | ICD-10-CM | POA: Diagnosis not present

## 2023-02-16 DIAGNOSIS — R0789 Other chest pain: Secondary | ICD-10-CM | POA: Diagnosis not present

## 2023-02-16 DIAGNOSIS — J449 Chronic obstructive pulmonary disease, unspecified: Secondary | ICD-10-CM | POA: Diagnosis not present

## 2023-02-16 DIAGNOSIS — R7989 Other specified abnormal findings of blood chemistry: Secondary | ICD-10-CM | POA: Diagnosis not present

## 2023-02-16 DIAGNOSIS — F319 Bipolar disorder, unspecified: Secondary | ICD-10-CM | POA: Diagnosis not present

## 2023-02-16 DIAGNOSIS — R918 Other nonspecific abnormal finding of lung field: Secondary | ICD-10-CM | POA: Diagnosis not present

## 2023-02-19 ENCOUNTER — Other Ambulatory Visit: Payer: Self-pay | Admitting: Family Medicine

## 2023-02-19 ENCOUNTER — Telehealth: Payer: Self-pay | Admitting: *Deleted

## 2023-02-19 ENCOUNTER — Encounter: Payer: Self-pay | Admitting: *Deleted

## 2023-02-19 NOTE — Transitions of Care (Post Inpatient/ED Visit) (Signed)
02/19/2023  Name: Heather Perkins MRN: [id number] DOB: [birth year]  Today's TOC FU Call Status: Today's TOC FU Call Status:: Successful TOC FU Call Competed TOC FU Call Complete Date: 02/19/23  Transition Care Management Follow-up Telephone Call Date of Discharge: 02/16/23 Discharge Facility: Other ([facility]) Name of Other ([facility]) Discharge Facility: [facility] Type of Discharge: Inpatient Admission Primary Inpatient Discharge Diagnosis:: Acute respiratory failure with hypoxia; CAP after fall/ chest wall pain How have you been since you were released from the hospital?: Better (per caregiver/ daughter: "She is back to normal and seems to be fine; she is completely independent; I supervise and help as needed.  Her appetite is not great- I will consider the probiotics") Any questions or concerns?: No  Items Reviewed: Did you receive and understand the discharge instructions provided?: Yes (briefly reviewed with caregiver who verbalizes good understanding of same - outside hospital AVS) Medications obtained,verified, and reconciled?: Yes (Medications Reviewed) (partial medication review completed; confirmed patient obtained/ is taking all newly Rx'd medications as instructed; self-manages medications and caregiver denies questions/ concerns around medications today) Any new allergies since your discharge?: No Dietary orders reviewed?: Yes Type of Diet Ordered:: Regular Do you have support at home?: Yes People in Home: child(ren), adult Name of Support/Comfort Primary Source: Reports independent in self-care activities; adult daughter assists as/ if needed/ indicated-- resides with daughter [name]  Medications Reviewed Today: Medications Reviewed Today     Reviewed by Michaela Corner, RN (Registered Nurse) on 02/19/23 at 1344  Med List Status: <None>   Medication Order Taking? Sig Documenting Provider Last Dose Status Informant  albuterol (VENTOLIN HFA) 108 (90 Base) MCG/ACT inhaler  [id number] Yes INHALE 2 PUFFS INTO THE LUNGS EVERY 6 HOURS AS NEEDED FOR WHEEZING OR SHORTNESS OF Seward Meth, DO Taking Active   ammonium lactate (LAC-HYDRIN) 12 % lotion [id number]  Apply topically. [provider]  Active   aspirin EC 81 MG tablet [id number]  Take 1 tablet (81 mg total) by mouth daily. Monica Becton, MD  Active   Azelastine HCl (ASTEPRO) 0.15 % SOLN [id number]  Place into the nose. [provider]  Active   B Complex Vitamins (B COMPLEX PO) [id number]  Take by mouth. [provider]  Active   Budeson-Glycopyrrol-Formoterol (BREZTRI AEROSPHERE) 160-9-4.8 MCG/ACT AERO [id number] Yes Inhale 2 puffs into the lungs 2 (two) times daily. Everrett Coombe, DO Taking Active   Calcium-Magnesium-Zinc 500-250-12.5 MG TABS [id number]  Take by mouth daily. TAKE 1 CAPSULE BY MOUTH 2 TIMES DAILY [provider]  Active   carbamide peroxide (DEBROX) 6.5 % OTIC solution [id number]  Place 10 drops into both ears 2 (two) times daily. Dispense 1 bottle [name], DO  Active   cefdinir (OMNICEF) 300 MG capsule [id number] Yes Take 300 mg by mouth 2 (two) times daily. Everrett Coombe, DO Taking Active Self           Med Note Marilu Favre Feb 19, 2023  1:40 PM) 02/19/23- reports prescribed at time of hospital discharge from [facility]-- has completed course as instructed  cholecalciferol (VITAMIN D3) 25 MCG (1000 UNIT) tablet [id number]  Take 1,000 Units by mouth daily. [provider]  Active   cilostazol (PLETAL) 100 MG tablet [id number]  TAKE 1 TABLET TWICE DAILY Everrett Coombe, DO  Active   cyclobenzaprine (FLEXERIL) 10 MG tablet [id number]  TAKE 1/2 TO 1 TABLET(5 TO 10 MG) BY MOUTH THREE TIMES DAILY AS NEEDED FOR MUSCLE SPASMS Everrett Coombe, DO  Active   fexofenadine (ALLEGRA) 180 MG tablet [id number]  Take 180 mg by mouth daily. [provider]  Active   fish oil-omega-3 fatty acids 1000 MG capsule [id number]  Take 2 g by mouth  daily. [provider]  Active   fluticasone (FLONASE) 50 MCG/ACT nasal spray [id number]  Place into both nostrils. [provider]  Active   HYDROcodone-acetaminophen [facility]) 10-325 MG tablet [id number]  Take 1 tab every 8 hours as needed for pain Everrett Coombe, DO  Active   levocetirizine (XYZAL) 5 MG tablet [id number]  Take 5 mg by mouth daily. [provider]  Active   lidocaine (HM LIDOCAINE PATCH) 4 % [id number]  Place 1 patch onto the skin daily. Charlton Amor, DO  Active   magnesium oxide (MAG-OX) 400 MG tablet [id number]  Take 2 tablets (800 mg total) by mouth at bedtime. Monica Becton, MD  Active   omeprazole (PRILOSEC) 20 MG capsule [id number]  TAKE 1 CAPSULE TWICE DAILY BEFORE MEALS Everrett Coombe, DO  Active   ondansetron (ZOFRAN) 4 MG tablet [id number] Yes TAKE 1 TO 2 TABLETS(4 TO 8 MG) BY MOUTH EVERY 8 HOURS AS NEEDED FOR NAUSEA OR VOMITING Everrett Coombe, DO Taking Active   polyethylene glycol (MIRALAX / GLYCOLAX) packet [id number]  Take 17 g by mouth daily. [provider]  Active            Med Note Ardyth Harps, Karma Lew   Wed Feb 20, 2018  2:51 PM) As per pt, taking med qod  pravastatin (PRAVACHOL) 40 MG tablet [id number]  TAKE 1 TABLET(40 MG) BY MOUTH DAILY Everrett Coombe, DO  Active   pregabalin (LYRICA) 75 MG capsule [id number] Yes Take 1 capsule (75 mg total) by mouth 2 (two) times daily. Everrett Coombe, DO Taking Active   promethazine (PHENERGAN) 25 MG tablet [id number] Yes Take by mouth. [provider] Taking Active   propylthiouracil (PTU) 50 MG tablet [id number]  TAKE 1 TABLET TWICE DAILY Everrett Coombe, DO  Active             Home Care and Equipment/Supplies: Were Home Health Services Ordered?: No Any new equipment or medical supplies ordered?: No  Functional Questionnaire: Do you need assistance with bathing/showering or dressing?: Yes (daughter assists as indicated) Do you need assistance with meal preparation?:  No Do you need assistance with eating?: No Do you have difficulty maintaining continence: No Do you need assistance with getting out of bed/getting out of a chair/moving?: No Do you have difficulty managing or taking your medications?: No (daughter assists as indicated)  Follow up appointments reviewed: PCP Follow-up appointment confirmed?: Yes (care coordination outreach in real-time with scheduling care guide to successfully schedule hospital follow up PCP appointment 02/26/23) Date of PCP follow-up appointment?: 02/26/23 Follow-up Provider: PCP Specialist Hospital Follow-up appointment confirmed?: Yes Date of Specialist follow-up appointment?: 03/21/23 Follow-Up Specialty Provider:: pulmonary Do you need transportation to your follow-up appointment?: No Do you understand care options if your condition(s) worsen?: Yes-patient verbalized understanding  SDOH Interventions Today    Flowsheet Row Most Recent Value  SDOH Interventions   Food Insecurity Interventions Intervention Not Indicated  Transportation Interventions Intervention Not Indicated  [2 local adult daughters provide transportation]      TOC Interventions Today    Flowsheet Row Most Recent Value  TOC Interventions   TOC Interventions Discussed/Reviewed TOC Interventions Discussed, Arranged PCP follow up less than 12 days/Care Guide scheduled      Interventions Today    Flowsheet Row  Most Recent Value  Chronic Disease   Chronic disease during today's visit Other  [acute respiratory failure with CAP/ hypoxia after mechanical fall- chest wall pain]  General Interventions   General Interventions Discussed/Reviewed Durable Medical Equipment (DME), Doctor Visits, General Interventions Discussed  Doctor Visits Discussed/Reviewed Doctor Visits Discussed, Specialist, PCP  Durable Medical Equipment (DME) Other  [daughter reports does not currently use assisitve devices]  PCP/Specialist Visits Compliance with follow-up visit   Exercise Interventions   Exercise Discussed/Reviewed Assistive device use and maintanence  [verified doesnot currently use assisitve devices]  Education Interventions   Education Provided Provided Education  Provided Verbal Education On Medication, Other  [potential benefits of probiotics in setting of antibiotic therapy,  signs/ symptoms side effects of antibiotics,  fall prevention education]  Nutrition Interventions   Nutrition Discussed/Reviewed Nutrition Discussed  Pharmacy Interventions   Pharmacy Dicussed/Reviewed Pharmacy Topics Discussed  Safety Interventions   Safety Discussed/Reviewed Safety Discussed, Fall Risk      Caryl Pina, RN, BSN, CCRN Alumnus RN CM Care Coordination/ Transition of Care- [facility] Care Management [phone]: direct office

## 2023-02-26 ENCOUNTER — Encounter: Payer: Self-pay | Admitting: Family Medicine

## 2023-02-26 ENCOUNTER — Ambulatory Visit (INDEPENDENT_AMBULATORY_CARE_PROVIDER_SITE_OTHER): Payer: Medicare HMO | Admitting: Family Medicine

## 2023-02-26 VITALS — BP 161/63 | HR 54 | Ht 63.0 in | Wt 124.0 lb

## 2023-02-26 DIAGNOSIS — M48062 Spinal stenosis, lumbar region with neurogenic claudication: Secondary | ICD-10-CM

## 2023-02-26 DIAGNOSIS — J189 Pneumonia, unspecified organism: Secondary | ICD-10-CM | POA: Diagnosis not present

## 2023-02-26 DIAGNOSIS — R918 Other nonspecific abnormal finding of lung field: Secondary | ICD-10-CM

## 2023-02-26 LAB — CBC WITH DIFFERENTIAL/PLATELET
Eosinophils Absolute: 141 cells/uL (ref 15–500)
Eosinophils Relative: 1.6 %
Hemoglobin: 13 g/dL (ref 11.7–15.5)
MCHC: 33.2 g/dL (ref 32.0–36.0)
Neutrophils Relative %: 76.8 %
WBC: 8.8 10*3/uL (ref 3.8–10.8)

## 2023-02-26 MED ORDER — PREGABALIN 50 MG PO CAPS: 50.0000 mg | ORAL_CAPSULE | Freq: Two times a day (BID) | ORAL | 0 refills | Status: DC

## 2023-02-26 NOTE — Assessment & Plan Note (Signed)
She recently completed course of cefdinir.  No increased dyspnea.

## 2023-02-26 NOTE — Assessment & Plan Note (Signed)
Lyrica has been effective for neuropathic pain.  She is having some dizziness however.  Decreasing to 50 mg twice per day.  She will let me know if this persists with decreased.

## 2023-02-26 NOTE — Assessment & Plan Note (Signed)
She has upcoming visit with [facility] chest.  I imagine she will need a biopsy of this and possible PET scan.

## 2023-02-26 NOTE — Patient Instructions (Addendum)
Decrease lyrica to 50mg  twice per day.   We'll be in touch with lab results.

## 2023-02-26 NOTE — Progress Notes (Signed)
Heather Perkins - 89 y.o. female MRN [id number]  Date of birth: January 03, 1934  Subjective Chief Complaint  Patient presents with   Hospitalization Follow-up    HPI Heather Perkins is an 89 year old female here today for hospital follow-up.  After last visit with me she had a fall injuring her lower ribs.  Over the next week she had increased difficulty breathing and some chest pain.  Her daughter took her to the ED as she was found to have pneumonia.  No rib fracture noted.  CT scan did show a right upper lobe mass as well.  She was treated with IV antibiotics and discharged home with cefdinir.  She was able to be weaned from oxygen prior to discharge.  She has not had any increased dyspnea or wheezing worsening chest pain since discharge.  She has been scheduled with [facility] for follow-up of pulmonary mass.  She has also been experiencing some dizziness since transitioning from gabapentin to Lyrica.  This does work well for her nerve pain but concerned she may be having side effects.  ROS:  A comprehensive ROS was completed and negative except as noted per HPI  Allergies  Allergen Reactions   Amitriptyline Other (See Comments)   Amoxicillin-Pot Clavulanate Nausea And Vomiting    Vomiting    Bupropion Other (See Comments)    constipation    Citalopram Hydrobromide Other (See Comments)   Codeine Nausea And Vomiting and Nausea Only   Erythromycin Nausea And Vomiting   Escitalopram Oxalate Nausea And Vomiting   Methimazole Other (See Comments)   Neomycin-Bacitracin Zn-Polymyx Other (See Comments)   Neomycin-Bacitracin-Polymyxin  [Bacitracin-Neomycin-Polymyxin] Other (See Comments)   Other Nausea And Vomiting, Nausea Only and Rash    All Mycins   Pentazocine Nausea And Vomiting   Pitavastatin Other (See Comments)    Headache.    Salsalate Other (See Comments)   Tetracyclines & Related Rash   Azithromycin    Crestor [Rosuvastatin Calcium] Nausea Only   Elavil [Amitriptyline Hcl]    Livalo  [Pitavastatin Calcium] Other (See Comments)    Headache.    Naproxen     nausea   Neosporin [Neomycin-Polymyxin-Gramicidin]    Prozac [Fluoxetine]    Rosuvastatin     Past Medical History:  Diagnosis Date   Allergy    Cancer (HCC)    skin   Colitis, ischemic (HCC)    Depression    Fibromyalgia    GERD (gastroesophageal reflux disease)    Hyperthyroidism 2010   sees Dr Louie Boston, did RAI   Peripheral vascular disease (HCC)    Shingles     Past Surgical History:  Procedure Laterality Date   ABDOMINAL HYSTERECTOMY  1975   for dub   APPENDECTOMY  1939   BREAST BIOPSY  1954   CARPAL TUNNEL RELEASE  1990   right   CATARACT EXTRACTION  2009   right and left.    CERVICAL FUSION     HEMORRHOID SURGERY  1969   ORIF ANKLE FRACTURE  1979   left   TONSILLECTOMY      Social History   Socioeconomic History   Marital status: Widowed    Spouse name: Not on file   Number of children: 3   Years of education: LPN school   Highest education level: 12th grade  Occupational History   Occupation: retired    Comment: LPN  Tobacco Use   Smoking status: Every Day    Packs/day: 1.00    Years: 50.00    Additional  pack years: 0.00    Total pack years: 50.00    Types: Cigarettes   Smokeless tobacco: Never   Tobacco comments:    pt states that she has been trying to quit but can't seem to quit  Vaping Use   Vaping Use: Never used  Substance and Sexual Activity   Alcohol use: No   Drug use: No   Sexual activity: Not Currently  Other Topics Concern   Not on file  Social History Narrative   Lives with her daughter, Heather Perkins. Pateint is hard of hearing. She enjoys working on puzzles.   Social Determinants of Health   Financial Resource Strain: Low Risk  (02/28/2021)   Overall Financial Resource Strain (CARDIA)    Difficulty of Paying Living Expenses: Not hard at all  Food Insecurity: No Food Insecurity (02/19/2023)   Hunger Vital Sign    Worried About Running Out of Food in  the Last Year: Never true    Ran Out of Food in the Last Year: Never true  Transportation Needs: No Transportation Needs (02/19/2023)   PRAPARE - Administrator, Civil Service (Medical): No    Lack of Transportation (Non-Medical): No  Physical Activity: Insufficiently Active (06/09/2022)   Exercise Vital Sign    Days of Exercise per Week: 4 days    Minutes of Exercise per Session: 20 min  Stress: No Stress Concern Present (06/09/2022)   [employer] of Occupational Health - Occupational Stress Questionnaire    Feeling of Stress : Only a little  Social Connections: Socially Isolated (06/09/2022)   Social Connection and Isolation Panel [NHANES]    Frequency of Communication with Friends and Family: More than three times a week    Frequency of Social Gatherings with Friends and Family: More than three times a week    Attends Religious Services: Never    Database administrator or Organizations: No    Attends Banker Meetings: Never    Marital Status: Widowed    Family History  Problem Relation Age of Onset   Hypertension Sister    Cancer Daughter    Hyperlipidemia Daughter     Health Maintenance  Topic Date Due   DTaP/Tdap/Td (2 - Td or Tdap) 03/27/2022   COVID-19 Vaccine (4 - 2023-24 season) 07/05/2023 (Originally 06/09/2022)   Zoster Vaccines- Shingrix (1 of 2) 07/05/2023 (Originally 11[birth year]17/1954)   INFLUENZA VACCINE  05/10/2023   Medicare Annual Wellness (AWV)  06/10/2023   Pneumonia Vaccine 67+ Years old  Completed   DEXA SCAN  Completed   HPV VACCINES  Aged Out     ----------------------------------------------------------------------------------------------------------------------------------------------------------------------------------------------------------------- Physical Exam BP (!) 161/63 (BP Location: Left Arm, Patient Position: Sitting, Cuff Size: Normal)   Pulse (!) 54   Ht 5\' 3"  (1.6 m)   Wt 124 lb (56.2 kg)   SpO2 98%   BMI 21.97 kg/m    Physical Exam Constitutional:      Appearance: Normal appearance.  HENT:     Head: Normocephalic and atraumatic.  Eyes:     General: No scleral icterus. Cardiovascular:     Rate and Rhythm: Normal rate and regular rhythm.  Pulmonary:     Effort: Pulmonary effort is normal.     Breath sounds: Normal breath sounds.  Neurological:     Mental Status: She is alert.  Psychiatric:        Mood and Affect: Mood normal.        Behavior: Behavior normal.     ------------------------------------------------------------------------------------------------------------------------------------------------------------------------------------------------------------------- Assessment  and Plan  Lumbar spinal stenosis Lyrica has been effective for neuropathic pain.  She is having some dizziness however.  Decreasing to 50 mg twice per day.  She will let me know if this persists with decreased.  Mass of upper lobe of right lung She has upcoming visit with [facility].  I imagine she will need a biopsy of this and possible PET scan.  Community acquired pneumonia of right lung She recently completed course of cefdinir.  No increased dyspnea.   Meds ordered this encounter  Medications   DISCONTD: pregabalin (LYRICA) 50 MG capsule    Sig: Take 1 capsule (50 mg total) by mouth 2 (two) times daily.    Dispense:  180 capsule    Refill:  0   DISCONTD: pregabalin (LYRICA) 50 MG capsule    Sig: Take 1 capsule (50 mg total) by mouth 2 (two) times daily.    Dispense:  180 capsule    Refill:  0   pregabalin (LYRICA) 50 MG capsule    Sig: Take 1 capsule (50 mg total) by mouth 2 (two) times daily.    Dispense:  180 capsule    Refill:  0    No follow-ups on file.    This visit occurred during the SARS-CoV-2 public health emergency.  Safety protocols were in place, including screening questions prior to the visit, additional usage of staff PPE, and extensive cleaning of exam room while observing  appropriate contact time as indicated for disinfecting solutions.

## 2023-02-27 LAB — CBC WITH DIFFERENTIAL/PLATELET
Absolute Monocytes: 484 cells/uL (ref 200–950)
Basophils Absolute: 26 cells/uL (ref 0–200)
Basophils Relative: 0.3 %
HCT: 39.2 % (ref 35.0–45.0)
Lymphs Abs: 1390 cells/uL (ref 850–3900)
MCH: 29.8 pg (ref 27.0–33.0)
MCV: 89.9 fL (ref 80.0–100.0)
MPV: 11.3 fL (ref 7.5–12.5)
Monocytes Relative: 5.5 %
Neutro Abs: 6758 cells/uL (ref 1500–7800)
Platelets: 290 10*3/uL (ref 140–400)
RBC: 4.36 10*6/uL (ref 3.80–5.10)
RDW: 12.7 % (ref 11.0–15.0)
Total Lymphocyte: 15.8 %

## 2023-02-27 LAB — COMPLETE METABOLIC PANEL WITH GFR
AG Ratio: 1.8 (calc) (ref 1.0–2.5)
ALT: 8 U/L (ref 6–29)
AST: 16 U/L (ref 10–35)
Albumin: 4.4 g/dL (ref 3.6–5.1)
Alkaline phosphatase (APISO): 70 U/L (ref 37–153)
BUN/Creatinine Ratio: 16 (calc) (ref 6–22)
BUN: 15 mg/dL (ref 7–25)
CO2: 25 mmol/L (ref 20–32)
Calcium: 9.1 mg/dL (ref 8.6–10.4)
Chloride: 102 mmol/L (ref 98–110)
Creat: 0.96 mg/dL — ABNORMAL HIGH (ref 0.60–0.95)
Globulin: 2.5 g/dL (calc) (ref 1.9–3.7)
Glucose, Bld: 107 mg/dL — ABNORMAL HIGH (ref 65–99)
Potassium: 4.5 mmol/L (ref 3.5–5.3)
Sodium: 136 mmol/L (ref 135–146)
Total Bilirubin: 0.5 mg/dL (ref 0.2–1.2)
Total Protein: 6.9 g/dL (ref 6.1–8.1)
eGFR: 57 mL/min/{1.73_m2} — ABNORMAL LOW (ref >=60)

## 2023-03-15 ENCOUNTER — Other Ambulatory Visit: Payer: Self-pay | Admitting: Family Medicine

## 2023-03-15 MED ORDER — HYDROCODONE-ACETAMINOPHEN 10-325 MG PO TABS: ORAL_TABLET | ORAL | 0 refills | Status: DC

## 2023-03-20 ENCOUNTER — Ambulatory Visit (INDEPENDENT_AMBULATORY_CARE_PROVIDER_SITE_OTHER): Payer: Medicare HMO | Admitting: Family Medicine

## 2023-03-20 ENCOUNTER — Encounter: Payer: Self-pay | Admitting: Family Medicine

## 2023-03-20 VITALS — BP 154/79 | HR 72 | Ht 63.0 in | Wt 115.0 lb

## 2023-03-20 DIAGNOSIS — R112 Nausea with vomiting, unspecified: Secondary | ICD-10-CM

## 2023-03-20 DIAGNOSIS — R918 Other nonspecific abnormal finding of lung field: Secondary | ICD-10-CM

## 2023-03-20 DIAGNOSIS — W19XXXA Unspecified fall, initial encounter: Secondary | ICD-10-CM

## 2023-03-20 DIAGNOSIS — R42 Dizziness and giddiness: Secondary | ICD-10-CM | POA: Diagnosis not present

## 2023-03-20 DIAGNOSIS — J189 Pneumonia, unspecified organism: Secondary | ICD-10-CM

## 2023-03-20 DIAGNOSIS — R519 Headache, unspecified: Secondary | ICD-10-CM

## 2023-03-20 DIAGNOSIS — K279 Peptic ulcer, site unspecified, unspecified as acute or chronic, without hemorrhage or perforation: Secondary | ICD-10-CM

## 2023-03-20 LAB — CBC WITH DIFFERENTIAL/PLATELET
Absolute Monocytes: 378 cells/uL (ref 200–950)
Basophils Relative: 0.6 %
Eosinophils Absolute: 68 cells/uL (ref 15–500)
MCV: 90 fL (ref 80.0–100.0)
MPV: 11.6 fL (ref 7.5–12.5)
Monocytes Relative: 6.1 %
RBC: 5.28 10*6/uL — ABNORMAL HIGH (ref 3.80–5.10)

## 2023-03-20 MED ORDER — OMEPRAZOLE 20 MG PO CPDR
DELAYED_RELEASE_CAPSULE | ORAL | 3 refills | Status: DC
Start: 2023-03-20 — End: 2023-08-13

## 2023-03-20 NOTE — Progress Notes (Unsigned)
Heather Perkins - 89 y.o. female MRN [id number]  Date of birth: 1934-07-26  Subjective Chief Complaint  Patient presents with  . Nausea    HPI  Allergies  Allergen Reactions  . Amitriptyline Other (See Comments)  . Amoxicillin-Pot Clavulanate Nausea And Vomiting    Vomiting   . Bupropion Other (See Comments)    constipation   . Citalopram Hydrobromide Other (See Comments)  . Codeine Nausea And Vomiting and Nausea Only  . Erythromycin Nausea And Vomiting  . Escitalopram Oxalate Nausea And Vomiting  . Methimazole Other (See Comments)  . Neomycin-Bacitracin Zn-Polymyx Other (See Comments)  . Neomycin-Bacitracin-Polymyxin  [Bacitracin-Neomycin-Polymyxin] Other (See Comments)  . Other Nausea And Vomiting, Nausea Only and Rash    All Mycins  . Pentazocine Nausea And Vomiting  . Pitavastatin Other (See Comments)    Headache.   . Salsalate Other (See Comments)  . Tetracyclines & Related Rash  . Azithromycin   . Crestor [Rosuvastatin Calcium] Nausea Only  . Elavil [Amitriptyline Hcl]   . Livalo [Pitavastatin Calcium] Other (See Comments)    Headache.   . Naproxen     nausea  . Neosporin [Neomycin-Polymyxin-Gramicidin]   . Prozac [Fluoxetine]   . Rosuvastatin     Past Medical History:  Diagnosis Date  . Allergy   . Cancer (HCC)    skin  . Colitis, ischemic (HCC)   . Depression   . Fibromyalgia   . GERD (gastroesophageal reflux disease)   . Hyperthyroidism 2010   sees Dr Louie Boston, did RAI  . Peripheral vascular disease (HCC)   . Shingles     Past Surgical History:  Procedure Laterality Date  . ABDOMINAL HYSTERECTOMY  1975   for dub  . APPENDECTOMY  1939  . BREAST BIOPSY  1954  . CARPAL TUNNEL RELEASE  1990   right  . CATARACT EXTRACTION  2009   right and left.   . CERVICAL FUSION    . HEMORRHOID SURGERY  1969  . ORIF ANKLE FRACTURE  1979   left  . TONSILLECTOMY      Social History   Socioeconomic History  . Marital status: Widowed    Spouse name: Not  on file  . Number of children: 3  . Years of education: LPN school  . Highest education level: 12th grade  Occupational History  . Occupation: retired    Comment: LPN  Tobacco Use  . Smoking status: Every Day    Packs/day: 1.00    Years: 50.00    Additional pack years: 0.00    Total pack years: 50.00    Types: Cigarettes  . Smokeless tobacco: Never  . Tobacco comments:    pt states that she has been trying to quit but can't seem to quit  Vaping Use  . Vaping Use: Never used  Substance and Sexual Activity  . Alcohol use: No  . Drug use: No  . Sexual activity: Not Currently  Other Topics Concern  . Not on file  Social History Narrative   Lives with her daughter, [name]. Pateint is hard of hearing. She enjoys working on puzzles.   Social Determinants of Health   Financial Resource Strain: Low Risk  (02/28/2021)   Overall Financial Resource Strain (CARDIA)   . Difficulty of Paying Living Expenses: Not hard at all  Food Insecurity: No Food Insecurity (02/19/2023)   Hunger Vital Sign   . Worried About Programme researcher, broadcasting/film/video in the Last Year: Never true   . Ran Out of Food  in the Last Year: Never true  Transportation Needs: No Transportation Needs (02/19/2023)   PRAPARE - Transportation   . Lack of Transportation (Medical): No   . Lack of Transportation (Non-Medical): No  Physical Activity: Insufficiently Active (06/09/2022)   Exercise Vital Sign   . Days of Exercise per Week: 4 days   . Minutes of Exercise per Session: 20 min  Stress: No Stress Concern Present (06/09/2022)   [employer] of Occupational Health - Occupational Stress Questionnaire   . Feeling of Stress : Only a little  Social Connections: Socially Isolated (06/09/2022)   Social Connection and Isolation Panel [NHANES]   . Frequency of Communication with Friends and Family: More than three times a week   . Frequency of Social Gatherings with Friends and Family: More than three times a week   . Attends Religious  Services: Never   . Active Member of Clubs or Organizations: No   . Attends Banker Meetings: Never   . Marital Status: Widowed    Family History  Problem Relation Age of Onset  . Hypertension Sister   . Cancer Daughter   . Hyperlipidemia Daughter     Health Maintenance  Topic Date Due  . DTaP/Tdap/Td (2 - Td or Tdap) 03/27/2022  . COVID-19 Vaccine (4 - 2023-24 season) 07/05/2023 (Originally 06/09/2022)  . Zoster Vaccines- Shingrix (1 of 2) 07/05/2023 (Originally 11/17/1954)  . INFLUENZA VACCINE  05/10/2023  . Medicare Annual Wellness (AWV)  06/10/2023  . Pneumonia Vaccine 41+ Years old  Completed  . DEXA SCAN  Completed  . HPV VACCINES  Aged Out     ----------------------------------------------------------------------------------------------------------------------------------------------------------------------------------------------------------------- Physical Exam BP (!) 154/79 (BP Location: Left Arm, Patient Position: Sitting, Cuff Size: Small)   Pulse 72   Ht 5\' 3"  (1.6 m)   Wt 115 lb (52.2 kg)   SpO2 98%   BMI 20.37 kg/m   Physical Exam  ------------------------------------------------------------------------------------------------------------------------------------------------------------------------------------------------------------------- Assessment and Plan  No problem-specific Assessment & Plan notes found for this encounter.   No orders of the defined types were placed in this encounter.   No follow-ups on file.    This visit occurred during the SARS-CoV-2 public health emergency.  Safety protocols were in place, including screening questions prior to the visit, additional usage of staff PPE, and extensive cleaning of exam room while observing appropriate contact time as indicated for disinfecting solutions.

## 2023-03-21 ENCOUNTER — Ambulatory Visit (INDEPENDENT_AMBULATORY_CARE_PROVIDER_SITE_OTHER): Payer: Medicare HMO

## 2023-03-21 DIAGNOSIS — R519 Headache, unspecified: Secondary | ICD-10-CM

## 2023-03-21 DIAGNOSIS — W19XXXA Unspecified fall, initial encounter: Secondary | ICD-10-CM | POA: Diagnosis not present

## 2023-03-21 DIAGNOSIS — R112 Nausea with vomiting, unspecified: Secondary | ICD-10-CM | POA: Diagnosis not present

## 2023-03-21 LAB — CBC WITH DIFFERENTIAL/PLATELET
Basophils Absolute: 37 cells/uL (ref 0–200)
Eosinophils Relative: 1.1 %
HCT: 47.5 % — ABNORMAL HIGH (ref 35.0–45.0)
Hemoglobin: 15.4 g/dL (ref 11.7–15.5)
Lymphs Abs: 1773 cells/uL (ref 850–3900)
MCH: 29.2 pg (ref 27.0–33.0)
MCHC: 32.4 g/dL (ref 32.0–36.0)
Neutro Abs: 3943 cells/uL (ref 1500–7800)
Neutrophils Relative %: 63.6 %
Platelets: 216 10*3/uL (ref 140–400)
RDW: 12.8 % (ref 11.0–15.0)
Total Lymphocyte: 28.6 %
WBC: 6.2 10*3/uL (ref 3.8–10.8)

## 2023-03-21 LAB — COMPLETE METABOLIC PANEL WITH GFR
AG Ratio: 1.8 (calc) (ref 1.0–2.5)
ALT: 11 U/L (ref 6–29)
AST: 16 U/L (ref 10–35)
Albumin: 4.6 g/dL (ref 3.6–5.1)
Alkaline phosphatase (APISO): 66 U/L (ref 37–153)
BUN: 9 mg/dL (ref 7–25)
CO2: 25 mmol/L (ref 20–32)
Calcium: 10 mg/dL (ref 8.6–10.4)
Chloride: 103 mmol/L (ref 98–110)
Creat: 0.77 mg/dL (ref 0.60–0.95)
Globulin: 2.5 g/dL (calc) (ref 1.9–3.7)
Glucose, Bld: 98 mg/dL (ref 65–99)
Potassium: 4.6 mmol/L (ref 3.5–5.3)
Sodium: 139 mmol/L (ref 135–146)
Total Bilirubin: 0.7 mg/dL (ref 0.2–1.2)
Total Protein: 7.1 g/dL (ref 6.1–8.1)
eGFR: 75 mL/min/{1.73_m2} (ref >=60)

## 2023-03-25 DIAGNOSIS — R112 Nausea with vomiting, unspecified: Secondary | ICD-10-CM | POA: Insufficient documentation

## 2023-03-25 NOTE — Assessment & Plan Note (Signed)
She has had some nausea with 1 episode of vomiting.  She lost her omeprazole several weeks ago and did not tell anyone.  Provide admits back symptoms helps.  Also ordered a noncontrast enhanced CT scan of the head with her fall previously.

## 2023-03-25 NOTE — Assessment & Plan Note (Signed)
Denies any new or worsening respiratory symptoms.  She is strongly encouraged to quit smoking.

## 2023-03-25 NOTE — Assessment & Plan Note (Signed)
She did not make this appointment with [facility].  They would prefer  to stay in [place].  Referral placed to [facility] sleep [facility].

## 2023-03-27 ENCOUNTER — Encounter: Payer: Self-pay | Admitting: Family Medicine

## 2023-04-03 DIAGNOSIS — R112 Nausea with vomiting, unspecified: Secondary | ICD-10-CM | POA: Diagnosis not present

## 2023-04-03 DIAGNOSIS — R918 Other nonspecific abnormal finding of lung field: Secondary | ICD-10-CM | POA: Diagnosis not present

## 2023-04-03 DIAGNOSIS — F17210 Nicotine dependence, cigarettes, uncomplicated: Secondary | ICD-10-CM | POA: Diagnosis not present

## 2023-04-10 ENCOUNTER — Other Ambulatory Visit: Payer: Self-pay | Admitting: Family Medicine

## 2023-04-17 DIAGNOSIS — K449 Diaphragmatic hernia without obstruction or gangrene: Secondary | ICD-10-CM | POA: Diagnosis not present

## 2023-04-17 DIAGNOSIS — I2510 Atherosclerotic heart disease of native coronary artery without angina pectoris: Secondary | ICD-10-CM | POA: Diagnosis not present

## 2023-04-17 DIAGNOSIS — K5730 Diverticulosis of large intestine without perforation or abscess without bleeding: Secondary | ICD-10-CM | POA: Diagnosis not present

## 2023-04-17 DIAGNOSIS — R918 Other nonspecific abnormal finding of lung field: Secondary | ICD-10-CM | POA: Diagnosis not present

## 2023-04-19 ENCOUNTER — Other Ambulatory Visit: Payer: Self-pay | Admitting: Family Medicine

## 2023-04-19 MED ORDER — HYDROCODONE-ACETAMINOPHEN 10-325 MG PO TABS: ORAL_TABLET | ORAL | 0 refills | Status: DC

## 2023-04-19 NOTE — Telephone Encounter (Signed)
Requesting rx rf of hydrocodone/ acet  10-325 Last written 03/15/2023 Last OV 03/20/2023 Next schld appt 08/13/2023

## 2023-04-21 DIAGNOSIS — E0590 Thyrotoxicosis, unspecified without thyrotoxic crisis or storm: Secondary | ICD-10-CM | POA: Diagnosis not present

## 2023-04-21 DIAGNOSIS — R42 Dizziness and giddiness: Secondary | ICD-10-CM | POA: Diagnosis not present

## 2023-04-21 DIAGNOSIS — R112 Nausea with vomiting, unspecified: Secondary | ICD-10-CM | POA: Diagnosis not present

## 2023-04-21 DIAGNOSIS — I10 Essential (primary) hypertension: Secondary | ICD-10-CM | POA: Diagnosis not present

## 2023-04-21 DIAGNOSIS — R4182 Altered mental status, unspecified: Secondary | ICD-10-CM | POA: Diagnosis not present

## 2023-04-21 DIAGNOSIS — R000 Tachycardia, unspecified: Secondary | ICD-10-CM | POA: Diagnosis not present

## 2023-04-21 DIAGNOSIS — K5730 Diverticulosis of large intestine without perforation or abscess without bleeding: Secondary | ICD-10-CM | POA: Diagnosis not present

## 2023-04-21 DIAGNOSIS — Z66 Do not resuscitate: Secondary | ICD-10-CM | POA: Diagnosis not present

## 2023-04-21 DIAGNOSIS — C3411 Malignant neoplasm of upper lobe, right bronchus or lung: Secondary | ICD-10-CM | POA: Diagnosis not present

## 2023-04-21 DIAGNOSIS — J449 Chronic obstructive pulmonary disease, unspecified: Secondary | ICD-10-CM | POA: Diagnosis not present

## 2023-04-21 DIAGNOSIS — R531 Weakness: Secondary | ICD-10-CM | POA: Diagnosis not present

## 2023-04-21 DIAGNOSIS — E876 Hypokalemia: Secondary | ICD-10-CM | POA: Diagnosis not present

## 2023-04-21 DIAGNOSIS — K8020 Calculus of gallbladder without cholecystitis without obstruction: Secondary | ICD-10-CM | POA: Diagnosis not present

## 2023-04-21 DIAGNOSIS — Z515 Encounter for palliative care: Secondary | ICD-10-CM | POA: Diagnosis not present

## 2023-04-21 DIAGNOSIS — R197 Diarrhea, unspecified: Secondary | ICD-10-CM | POA: Diagnosis not present

## 2023-04-21 DIAGNOSIS — K862 Cyst of pancreas: Secondary | ICD-10-CM | POA: Diagnosis not present

## 2023-04-21 DIAGNOSIS — N390 Urinary tract infection, site not specified: Secondary | ICD-10-CM | POA: Diagnosis not present

## 2023-04-21 DIAGNOSIS — N281 Cyst of kidney, acquired: Secondary | ICD-10-CM | POA: Diagnosis not present

## 2023-04-21 DIAGNOSIS — G893 Neoplasm related pain (acute) (chronic): Secondary | ICD-10-CM | POA: Diagnosis not present

## 2023-04-21 DIAGNOSIS — C7931 Secondary malignant neoplasm of brain: Secondary | ICD-10-CM | POA: Diagnosis not present

## 2023-04-21 DIAGNOSIS — R110 Nausea: Secondary | ICD-10-CM | POA: Diagnosis not present

## 2023-04-21 DIAGNOSIS — G9389 Other specified disorders of brain: Secondary | ICD-10-CM | POA: Diagnosis not present

## 2023-04-21 DIAGNOSIS — R9082 White matter disease, unspecified: Secondary | ICD-10-CM | POA: Diagnosis not present

## 2023-04-23 ENCOUNTER — Encounter: Payer: Self-pay | Admitting: Family Medicine

## 2023-05-03 ENCOUNTER — Other Ambulatory Visit: Payer: Self-pay | Admitting: Family Medicine

## 2023-05-03 DIAGNOSIS — E0590 Thyrotoxicosis, unspecified without thyrotoxic crisis or storm: Secondary | ICD-10-CM

## 2023-06-26 DIAGNOSIS — C7931 Secondary malignant neoplasm of brain: Secondary | ICD-10-CM | POA: Diagnosis not present

## 2023-08-10 DEATH — deceased

## 2023-08-13 ENCOUNTER — Ambulatory Visit: Payer: Medicare HMO | Admitting: Family Medicine
